# Patient Record
Sex: Female | Born: 1995 | Race: White | Hispanic: Yes | Marital: Single | Smoking: Never smoker
Health system: Southern US, Community
[De-identification: ages and names within clinical notes are randomized; demographics above are authoritative.]

## PROBLEM LIST (undated history)

## (undated) DIAGNOSIS — F32A Depression, unspecified: Secondary | ICD-10-CM

## (undated) DIAGNOSIS — F988 Other specified behavioral and emotional disorders with onset usually occurring in childhood and adolescence: Secondary | ICD-10-CM

## (undated) DIAGNOSIS — F319 Bipolar disorder, unspecified: Secondary | ICD-10-CM

## (undated) DIAGNOSIS — J45909 Unspecified asthma, uncomplicated: Secondary | ICD-10-CM

## (undated) DIAGNOSIS — F329 Major depressive disorder, single episode, unspecified: Secondary | ICD-10-CM

## (undated) DIAGNOSIS — E559 Vitamin D deficiency, unspecified: Secondary | ICD-10-CM

## (undated) DIAGNOSIS — E7800 Pure hypercholesterolemia, unspecified: Secondary | ICD-10-CM

## (undated) DIAGNOSIS — D649 Anemia, unspecified: Secondary | ICD-10-CM

## (undated) DIAGNOSIS — F419 Anxiety disorder, unspecified: Secondary | ICD-10-CM

## (undated) HISTORY — PX: NOSE SURGERY: SHX723

## (undated) HISTORY — DX: Anemia, unspecified: D64.9

## (undated) HISTORY — DX: Anxiety disorder, unspecified: F41.9

## (undated) HISTORY — DX: Major depressive disorder, single episode, unspecified: F32.9

## (undated) HISTORY — DX: Other specified behavioral and emotional disorders with onset usually occurring in childhood and adolescence: F98.8

## (undated) HISTORY — DX: Pure hypercholesterolemia, unspecified: E78.00

## (undated) HISTORY — DX: Vitamin D deficiency, unspecified: E55.9

## (undated) HISTORY — DX: Depression, unspecified: F32.A

## (undated) HISTORY — DX: Bipolar disorder, unspecified: F31.9

---
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
## 2016-01-01 DIAGNOSIS — F329 Major depressive disorder, single episode, unspecified: Secondary | ICD-10-CM | POA: Insufficient documentation

## 2016-01-01 DIAGNOSIS — F32A Depression, unspecified: Secondary | ICD-10-CM | POA: Insufficient documentation

## 2016-01-07 ENCOUNTER — Other Ambulatory Visit: Payer: Self-pay | Admitting: Student

## 2016-01-07 DIAGNOSIS — E049 Nontoxic goiter, unspecified: Secondary | ICD-10-CM

## 2016-01-13 ENCOUNTER — Ambulatory Visit
Admission: RE | Admit: 2016-01-13 | Discharge: 2016-01-13 | Disposition: A | Discharge status: Home / Self Care | Source: Ambulatory Visit | Attending: Student | Admitting: Student

## 2016-01-13 DIAGNOSIS — E049 Nontoxic goiter, unspecified: Secondary | ICD-10-CM | POA: Insufficient documentation

## 2016-09-13 DIAGNOSIS — F3132 Bipolar disorder, current episode depressed, moderate: Secondary | ICD-10-CM | POA: Insufficient documentation

## 2016-11-06 IMAGING — US US ABDOMEN LIMITED
1 series · 14 of 25 positions shown · non-contrast
Comparison: None.

CLINICAL DATA: Intermittent RIGHT upper quadrant pain, nausea and
vomiting for 1 month.

EXAM:
US ABDOMEN LIMITED - RIGHT UPPER QUADRANT

[Series 1: us abdomen limited · 0.18mm/px · 14 of 40 slices shown]
[im 1/40]
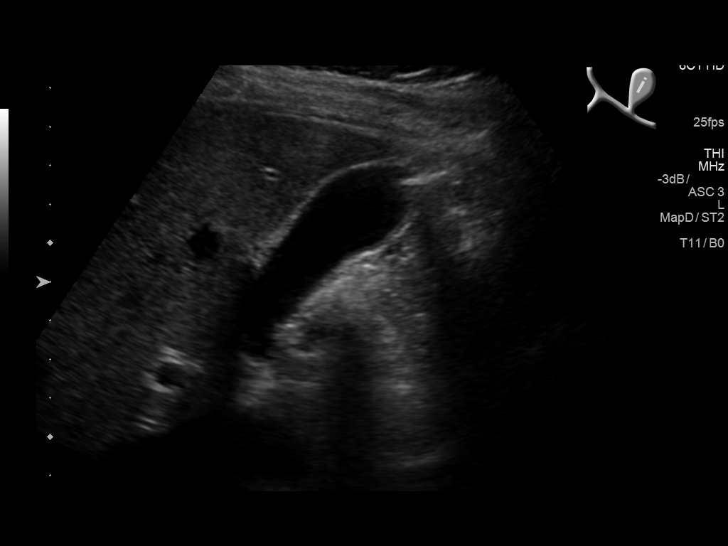
[im 4/40]
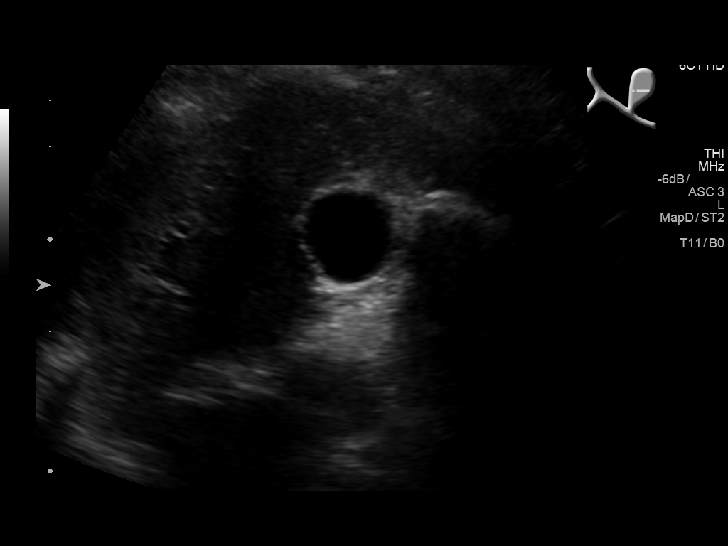
[im 7/40]
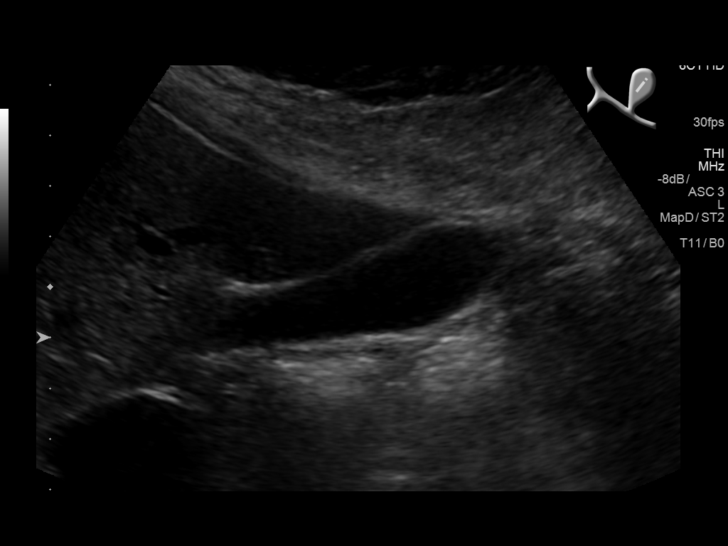
[im 10/40]
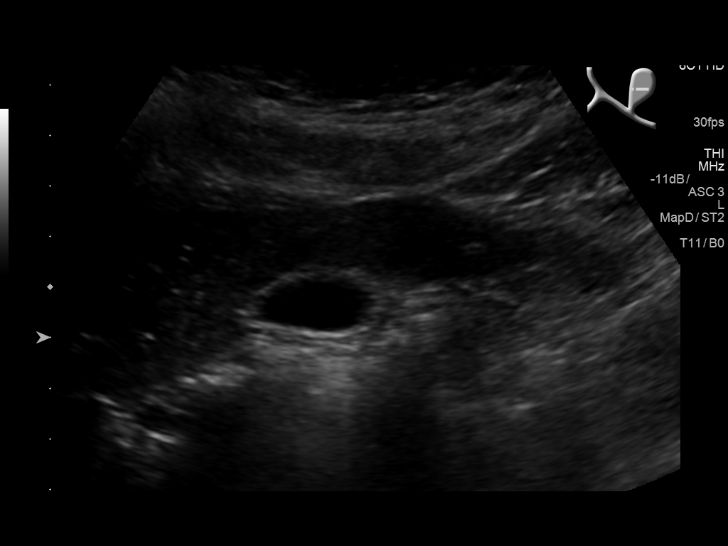
[im 14/40]
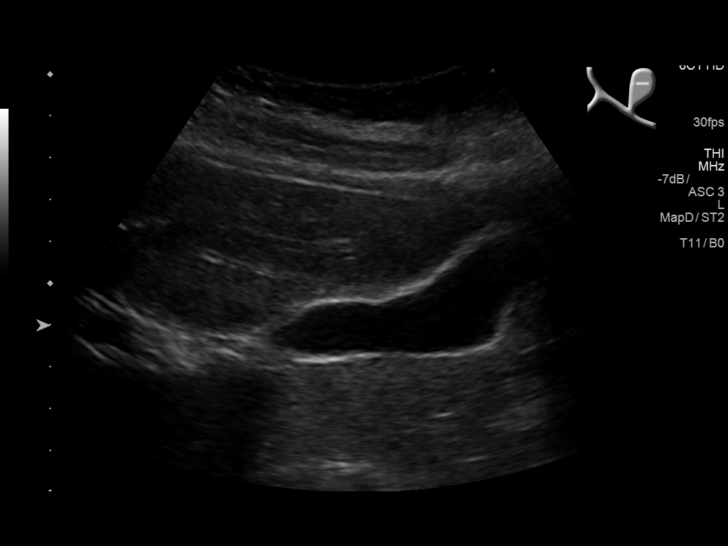
[im 15/40]
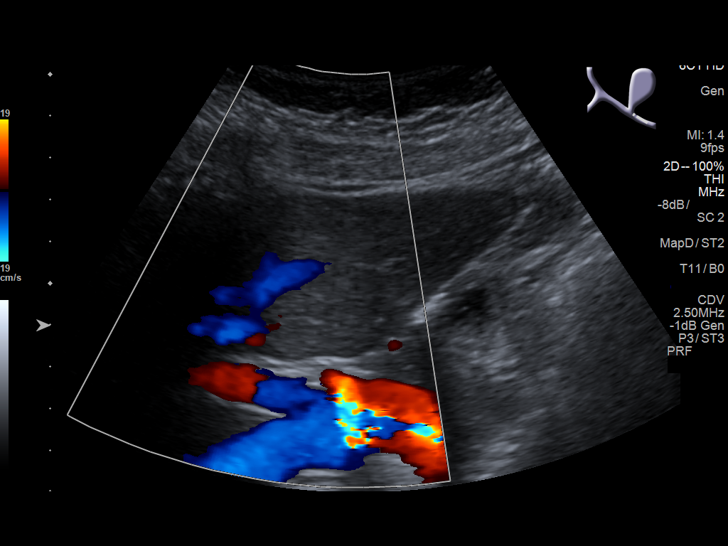
[im 18/40]
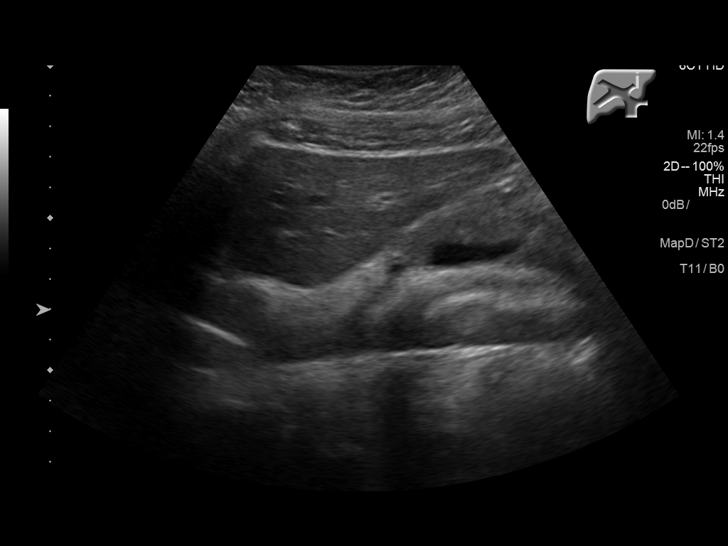
[im 22/40]
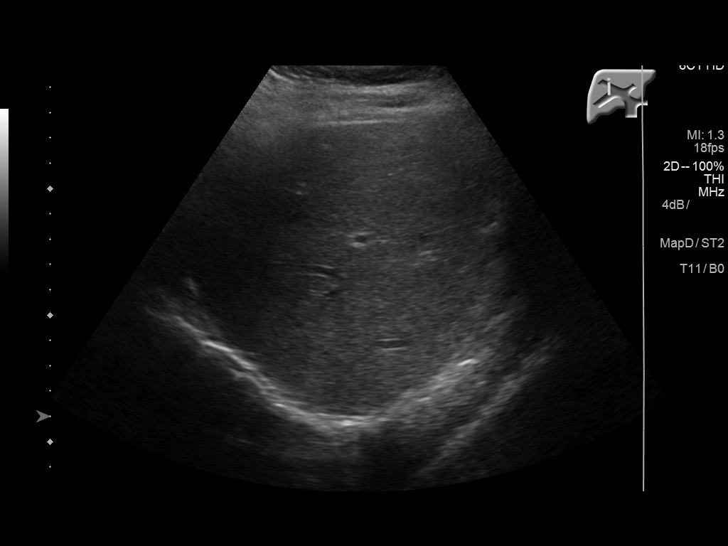
[im 25/40]
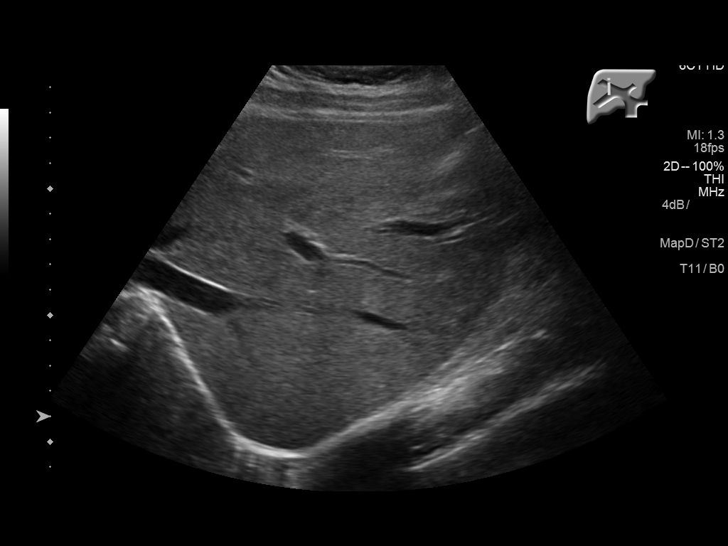
[im 27/40]
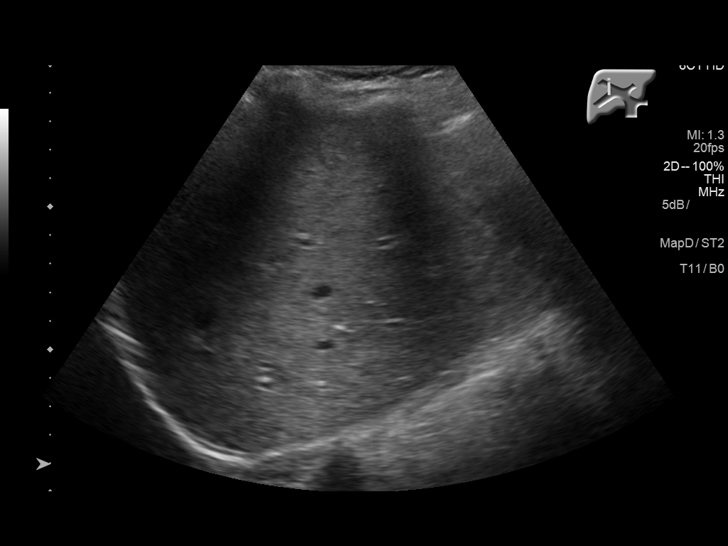
[im 30/40]
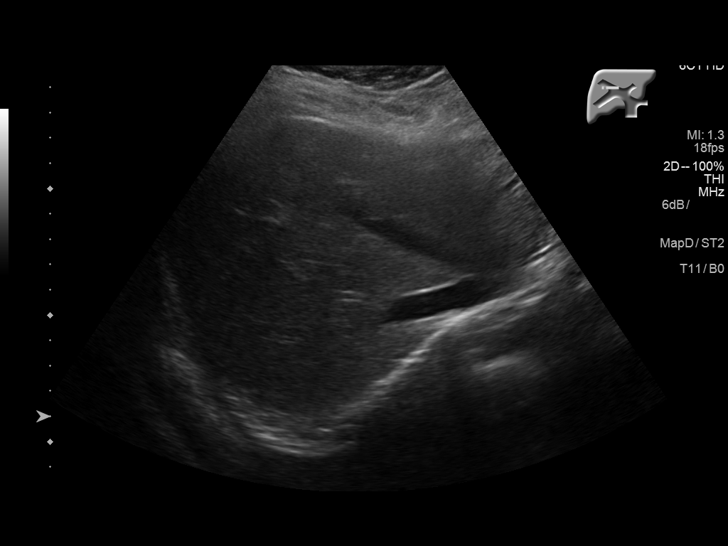
[im 33/40]
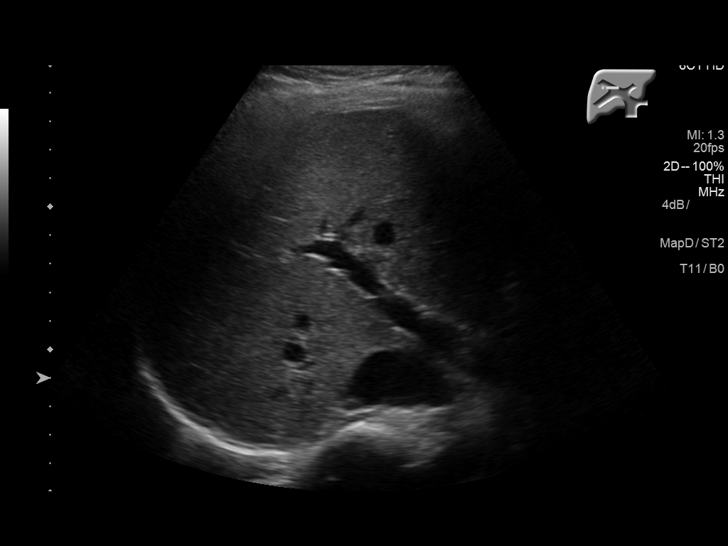
[im 36/40]
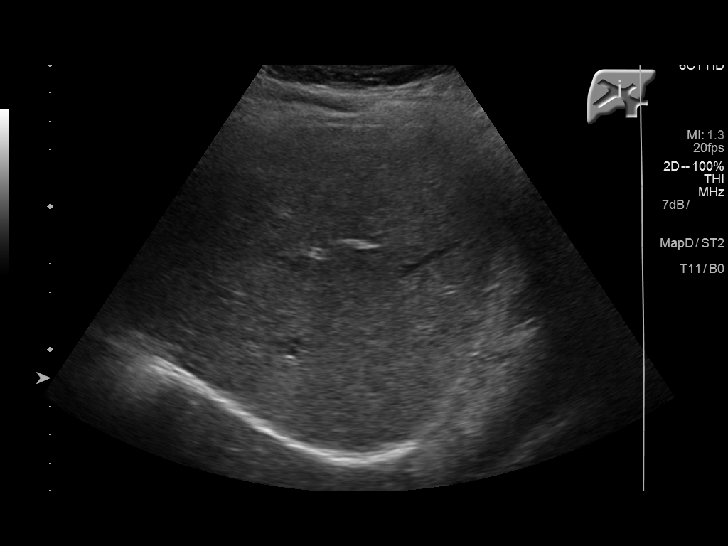
[im 40/40]
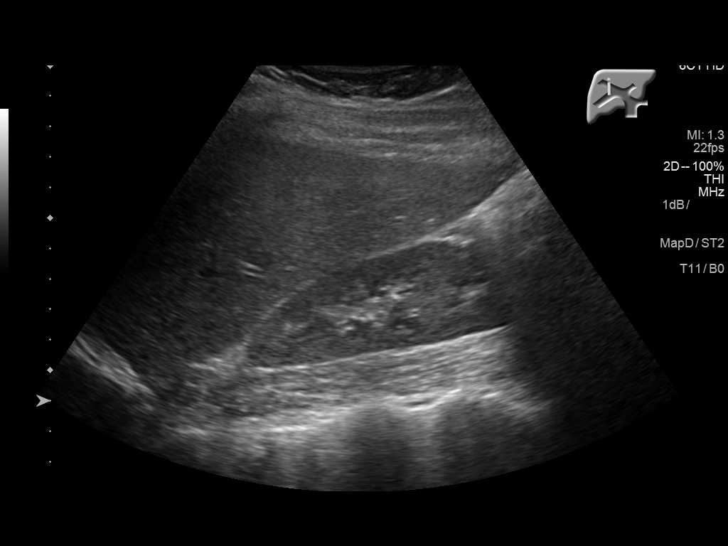

[14 of 25 positions shown; findings below may reference images not displayed]

FINDINGS: Gallbladder:

No gallstones or wall thickening visualized. No sonographic Murphy
sign noted by sonographer.

Common bile duct:

Diameter: 2 mm

Liver:

No focal lesion identified. Within normal limits in parenchymal
echogenicity. Hepatopetal portal vein.
IMPRESSION: Negative RIGHT upper quadrant ultrasound.

## 2017-02-10 IMAGING — US US SOFT TISSUE HEAD/NECK
1 series · 14 of 25 positions shown · non-contrast
Comparison: None.

CLINICAL DATA: Goiter.  Palpable thyroid enlargement.

EXAM:
THYROID ULTRASOUND
TECHNIQUE: Ultrasound examination of the thyroid gland and adjacent soft
tissues was performed.

[Series 1: us soft tissue head/neck · 0.07mm/px · 14 of 44 slices shown]
[im 1/44]
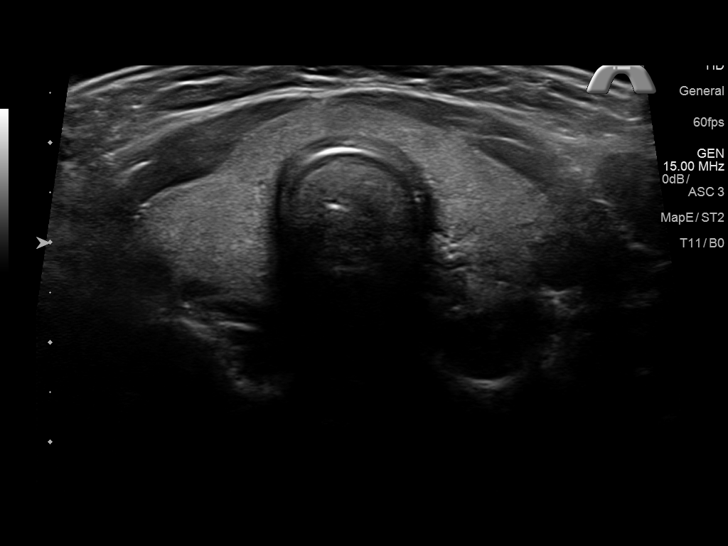
[im 4/44]
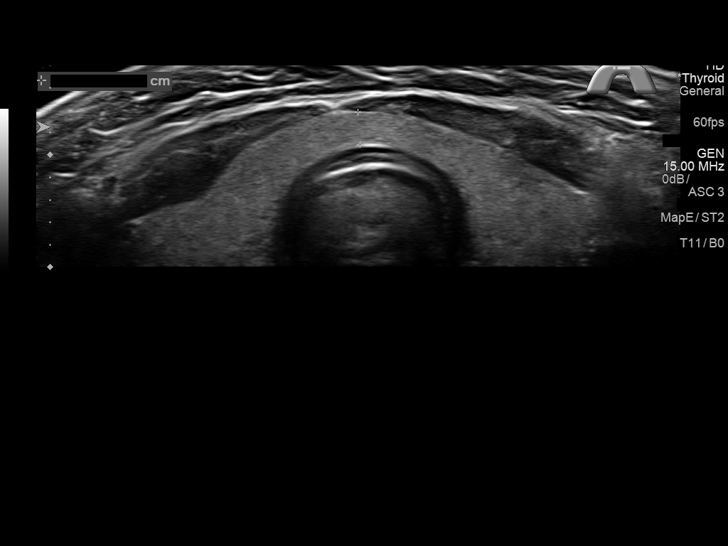
[im 8/44]
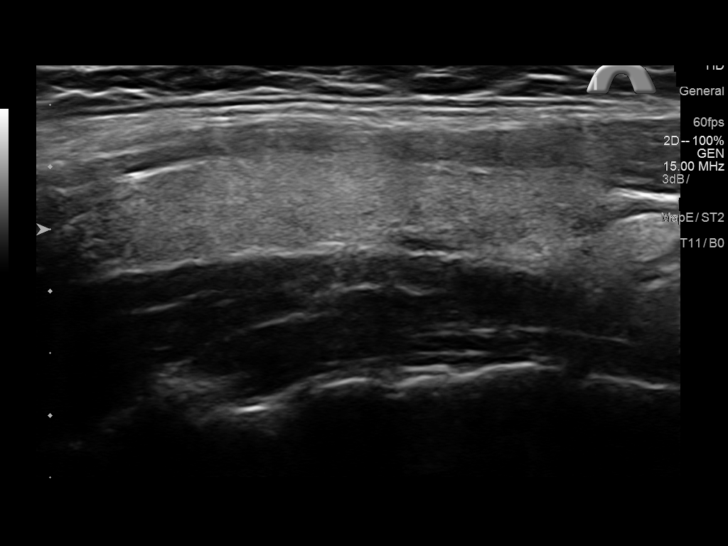
[im 11/44]
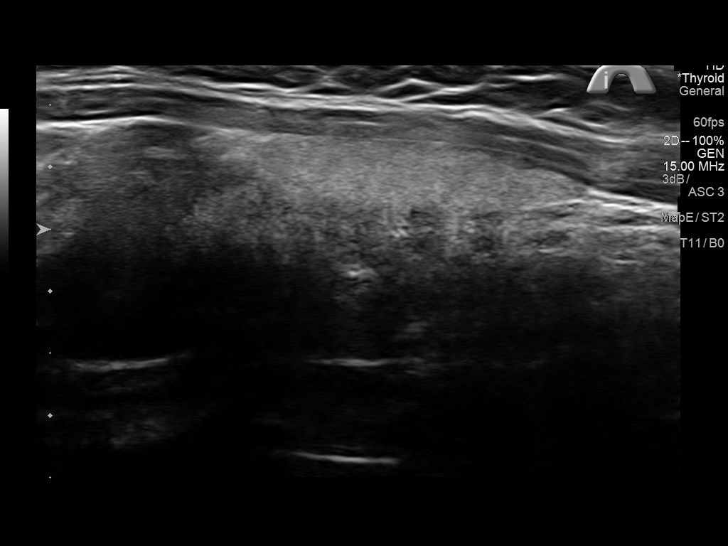
[im 15/44]
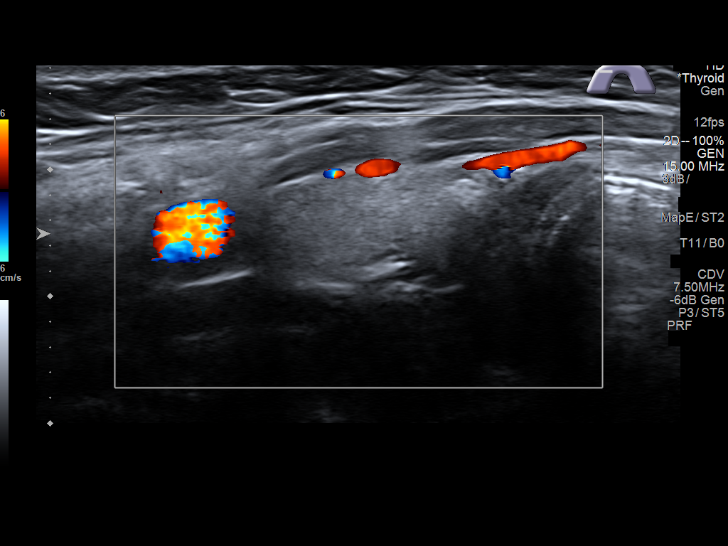
[im 17/44]
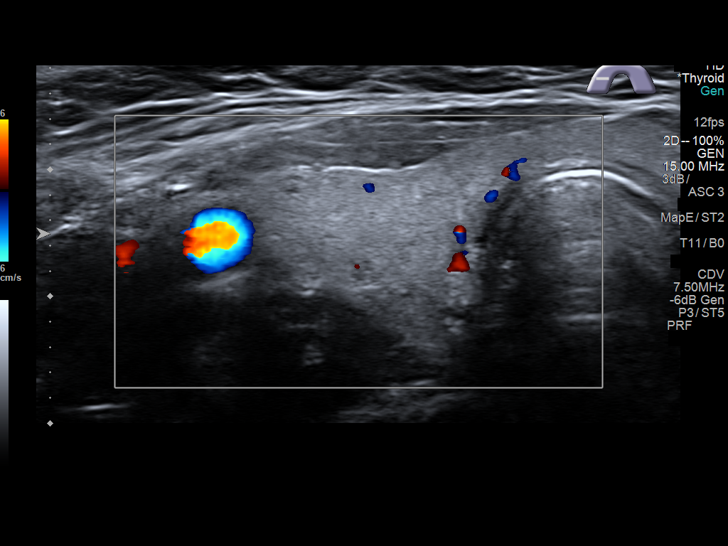
[im 20/44]
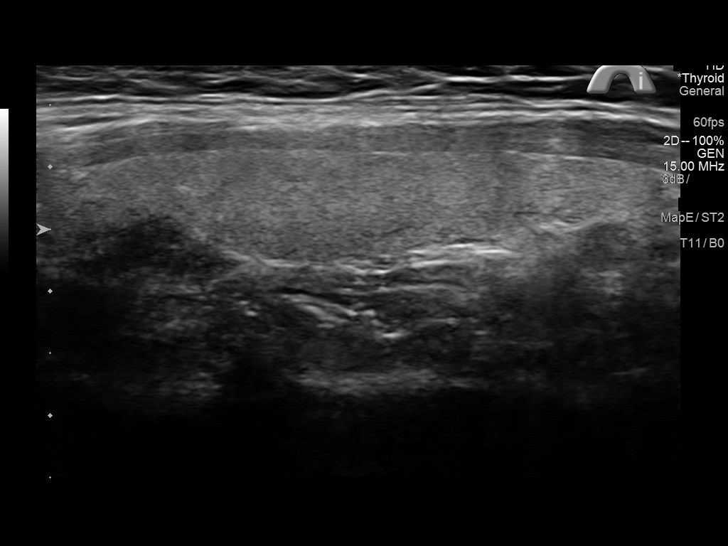
[im 24/44]
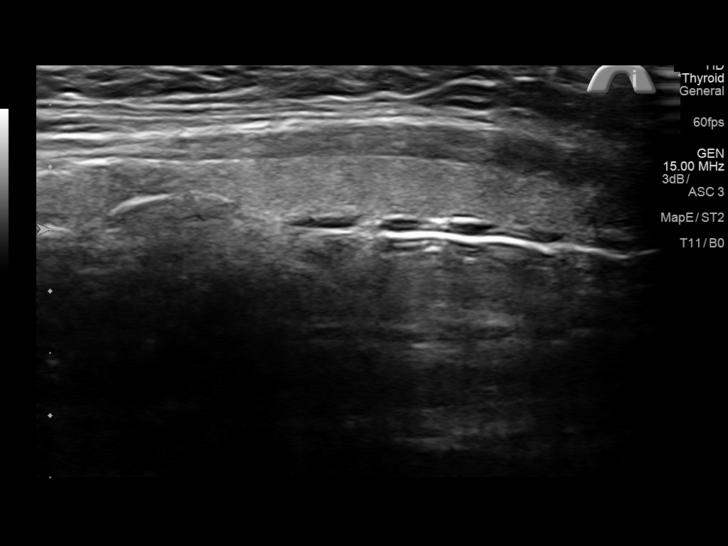
[im 27/44]
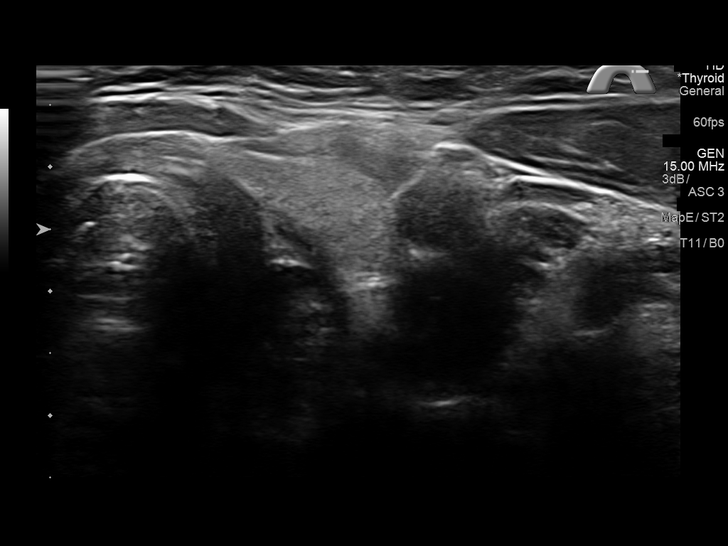
[im 29/44]
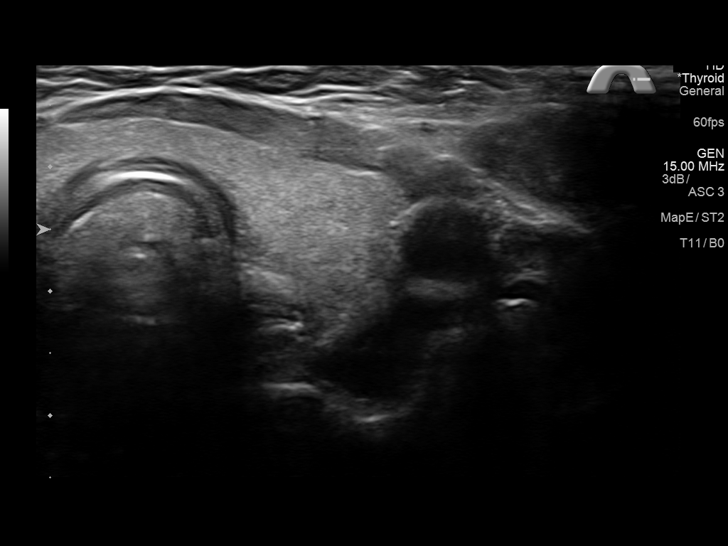
[im 33/44]
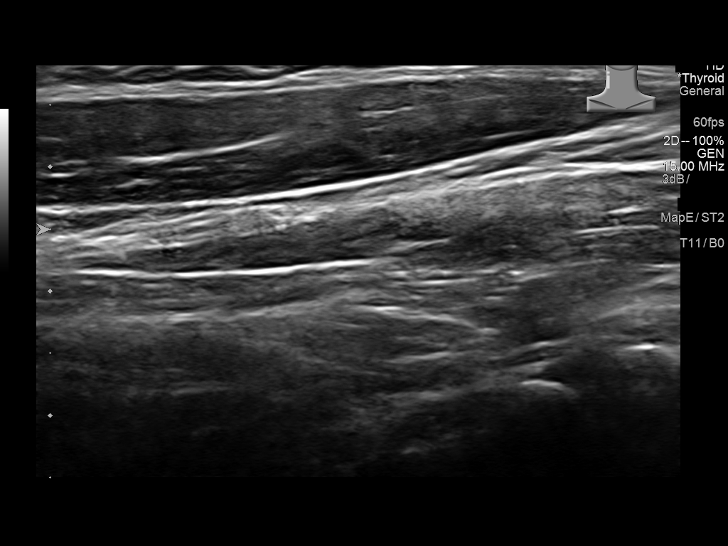
[im 36/44]
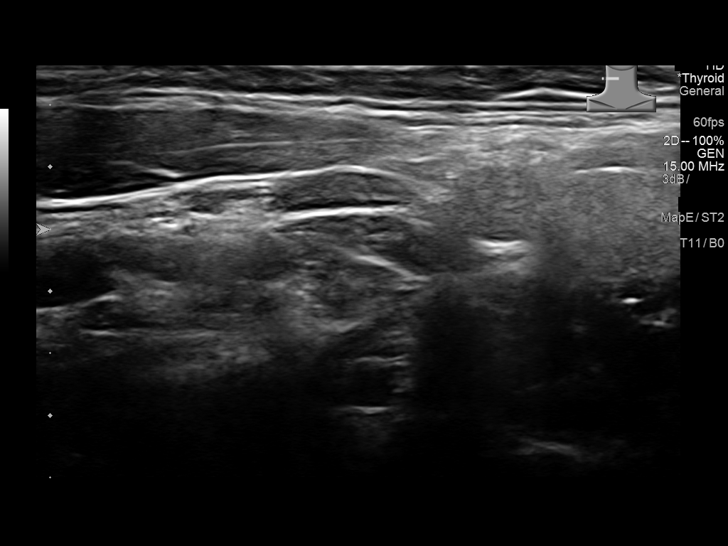
[im 40/44]
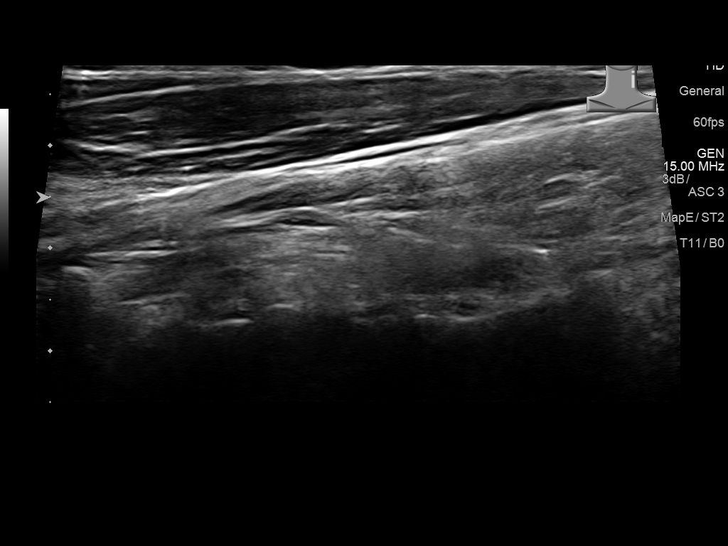
[im 44/44]
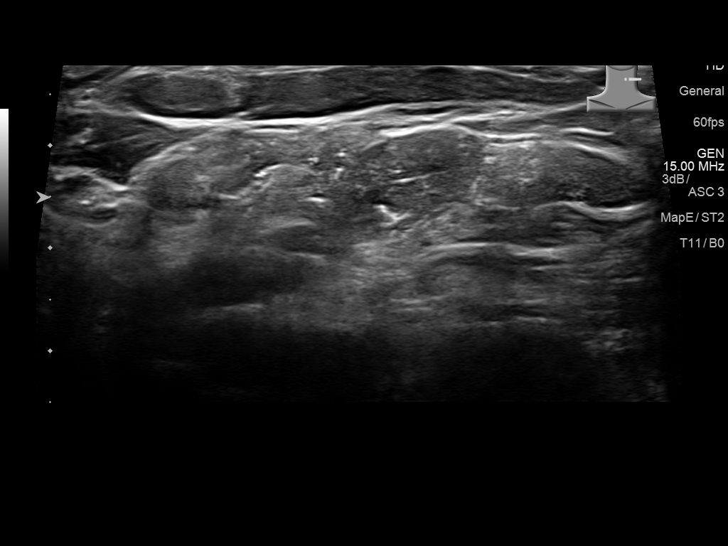

[14 of 25 positions shown; findings below may reference images not displayed]

FINDINGS: Parenchymal Echotexture: Normal

Estimated total number of nodules >/= 1 cm: 0

Number of spongiform nodules >/=  2 cm not described below (TR1): 0

Number of mixed cystic and solid nodules >/= 1.5 cm not described
below (TR2): 0

_________________________________________________________

Isthmus: 0.3 cm

No discrete nodules are identified within the thyroid isthmus.

_________________________________________________________

Right lobe: 4.9 x 1.0 x 1.6 cm

No discrete nodules are identified within the right lobe of the
thyroid.

_________________________________________________________

Left lobe: 4.7 x 0.8 x 1.3 cm

No discrete nodules are identified within the left lobe of the
thyroid.

No enlarged lymph nodes identified.
IMPRESSION: Normal thyroid ultrasound. Thyroid gland volume appears normal by
ultrasound.

## 2018-02-20 ENCOUNTER — Encounter: Payer: Self-pay | Admitting: Psychiatry

## 2018-02-20 ENCOUNTER — Ambulatory Visit (INDEPENDENT_AMBULATORY_CARE_PROVIDER_SITE_OTHER): Admitting: Psychiatry

## 2018-02-20 ENCOUNTER — Other Ambulatory Visit: Payer: Self-pay

## 2018-02-20 VITALS — BP 118/81 | HR 91 | Temp 99.0°F | Wt 169.6 lb

## 2018-02-20 DIAGNOSIS — F5105 Insomnia due to other mental disorder: Secondary | ICD-10-CM | POA: Diagnosis not present

## 2018-02-20 DIAGNOSIS — F411 Generalized anxiety disorder: Secondary | ICD-10-CM | POA: Diagnosis not present

## 2018-02-20 DIAGNOSIS — F329 Major depressive disorder, single episode, unspecified: Secondary | ICD-10-CM

## 2018-02-20 DIAGNOSIS — F32A Depression, unspecified: Secondary | ICD-10-CM

## 2018-02-20 MED ORDER — HYDROXYZINE HCL 25 MG PO TABS: 12.5000 mg | ORAL_TABLET | Freq: Three times a day (TID) | ORAL | 3 refills | Status: DC | PRN

## 2018-02-20 NOTE — Patient Instructions (Signed)
Hydroxyzine capsules or tablets What is this medicine? HYDROXYZINE (hye DROX i zeen) is an antihistamine. This medicine is used to treat allergy symptoms. It is also used to treat anxiety and tension. This medicine can be used with other medicines to induce sleep before surgery. This medicine may be used for other purposes; ask your health care provider or pharmacist if you have questions. COMMON BRAND NAME(S): ANX, Atarax, Rezine, Vistaril What should I tell my health care provider before I take this medicine? They need to know if you have any of these conditions: -glaucoma -heart disease -history of irregular heartbeat -kidney disease -liver disease -lung or breathing disease, like asthma -stomach or intestine problems -thyroid disease -trouble passing urine -an unusual or allergic reaction to hydroxyzine, cetirizine, other medicines, foods, dyes or preservatives -pregnant or trying to get pregnant -breast-feeding How should I use this medicine? Take this medicine by mouth with a full glass of water. Follow the directions on the prescription label. You may take this medicine with food or on an empty stomach. Take your medicine at regular intervals. Do not take your medicine more often than directed. Talk to your pediatrician regarding the use of this medicine in children. Special care may be needed. While this drug may be prescribed for children as [place] as 6 years of age for selected conditions, precautions do apply. Patients over 65 years old may have a stronger reaction and need a smaller dose. Overdosage: If you think you have taken too much of this medicine contact a poison control center or emergency room at once. NOTE: This medicine is only for you. Do not share this medicine with others. What if I miss a dose? If you miss a dose, take it as soon as you can. If it is almost time for your next dose, take only that dose. Do not take double or extra doses. What may interact with this  medicine? Do not take this medicine with any of the following medications: -cisapride -dofetilide -dronedarone -pimozide -thioridazine This medicine may also interact with the following medications: -alcohol -antihistamines for allergy, cough, and cold -atropine -barbiturate medicines for sleep or seizures, like phenobarbital -certain antibiotics like erythromycin or clarithromycin -certain medicines for anxiety or sleep -certain medicines for bladder problems like oxybutynin, tolterodine -certain medicines for depression or psychotic disturbances -certain medicines for irregular heart beat -certain medicines for Parkinson's disease like benztropine, trihexyphenidyl -certain medicines for seizures like phenobarbital, primidone -certain medicines for stomach problems like dicyclomine, hyoscyamine -certain medicines for travel sickness like scopolamine -ipratropium -narcotic medicines for pain -other medicines that prolong the QT interval (an abnormal heart rhythm) This list may not describe all possible interactions. Give your health care provider a list of all the medicines, herbs, non-prescription drugs, or dietary supplements you use. Also tell them if you smoke, drink alcohol, or use illegal drugs. Some items may interact with your medicine. What should I watch for while using this medicine? Tell your doctor or health care professional if your symptoms do not improve. You may get drowsy or dizzy. Do not drive, use machinery, or do anything that needs mental alertness until you know how this medicine affects you. Do not stand or sit up quickly, especially if you are an older patient. This reduces the risk of dizzy or fainting spells. Alcohol may interfere with the effect of this medicine. Avoid alcoholic drinks. Your mouth may get dry. Chewing sugarless gum or sucking hard candy, and drinking plenty of water may help. Contact your doctor if   the problem does not go away or is  severe. This medicine may cause dry eyes and blurred vision. If you wear contact lenses you may feel some discomfort. Lubricating drops may help. See your eye doctor if the problem does not go away or is severe. If you are receiving skin tests for allergies, tell your doctor you are using this medicine. What side effects may I notice from receiving this medicine? Side effects that you should report to your doctor or health care professional as soon as possible: -allergic reactions like skin rash, itching or hives, swelling of the face, lips, or tongue -changes in vision -confusion -fast, irregular heartbeat -seizures -tremor -trouble passing urine or change in the amount of urine Side effects that usually do not require medical attention (report to your doctor or health care professional if they continue or are bothersome): -constipation -drowsiness -dry mouth -headache -tiredness This list may not describe all possible side effects. Call your doctor for medical advice about side effects. You may report side effects to FDA at 1-800-FDA-1088. Where should I keep my medicine? Keep out of the reach of children. Store at room temperature between 15 and 30 degrees C (59 and 86 degrees F). Keep container tightly closed. Throw away any unused medicine after the expiration date. NOTE: This sheet is a summary. It may not cover all possible information. If you have questions about this medicine, talk to your doctor, pharmacist, or health care provider.  2019 Elsevier/Gold Standard (2017-07-17 13:25:13)  
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
## 2019-04-10 ENCOUNTER — Encounter: Payer: Self-pay | Admitting: Family Medicine

## 2019-04-10 ENCOUNTER — Other Ambulatory Visit: Payer: Self-pay

## 2019-04-10 ENCOUNTER — Ambulatory Visit (INDEPENDENT_AMBULATORY_CARE_PROVIDER_SITE_OTHER): Admitting: Family Medicine

## 2019-04-10 VITALS — BP 118/82 | HR 85 | Temp 98.0°F | Ht 64.0 in | Wt 165.2 lb

## 2019-04-10 DIAGNOSIS — R1084 Generalized abdominal pain: Secondary | ICD-10-CM

## 2019-04-10 DIAGNOSIS — G8929 Other chronic pain: Secondary | ICD-10-CM

## 2019-04-10 DIAGNOSIS — F329 Major depressive disorder, single episode, unspecified: Secondary | ICD-10-CM | POA: Diagnosis not present

## 2019-04-10 DIAGNOSIS — F5104 Psychophysiologic insomnia: Secondary | ICD-10-CM

## 2019-04-10 DIAGNOSIS — Z975 Presence of (intrauterine) contraceptive device: Secondary | ICD-10-CM

## 2019-04-10 DIAGNOSIS — F32A Depression, unspecified: Secondary | ICD-10-CM

## 2019-04-10 DIAGNOSIS — F411 Generalized anxiety disorder: Secondary | ICD-10-CM | POA: Diagnosis not present

## 2019-04-10 HISTORY — DX: Psychophysiologic insomnia: F51.04

## 2019-04-10 HISTORY — DX: Generalized anxiety disorder: F41.1

## 2019-04-10 HISTORY — DX: Presence of (intrauterine) contraceptive device: Z97.5

## 2019-04-10 LAB — CBC WITH DIFFERENTIAL/PLATELET
Basophils Absolute: 0 10*3/uL (ref 0.0–0.1)
Basophils Relative: 0.6 % (ref 0.0–3.0)
Eosinophils Absolute: 0.1 10*3/uL (ref 0.0–0.7)
Eosinophils Relative: 1.4 % (ref 0.0–5.0)
HCT: 40 % (ref 36.0–46.0)
Hemoglobin: 13.1 g/dL (ref 12.0–15.0)
Lymphocytes Relative: 33.4 % (ref 12.0–46.0)
Lymphs Abs: 2.5 10*3/uL (ref 0.7–4.0)
MCHC: 32.7 g/dL (ref 30.0–36.0)
MCV: 89.8 fl (ref 78.0–100.0)
Monocytes Absolute: 0.4 10*3/uL (ref 0.1–1.0)
Monocytes Relative: 5.9 % (ref 3.0–12.0)
Neutro Abs: 4.4 10*3/uL (ref 1.4–7.7)
Neutrophils Relative %: 58.7 % (ref 43.0–77.0)
Platelets: 283 10*3/uL (ref 150.0–400.0)
RBC: 4.45 Mil/uL (ref 3.87–5.11)
RDW: 13.2 % (ref 11.5–15.5)
WBC: 7.5 10*3/uL (ref 4.0–10.5)

## 2019-04-10 LAB — H. PYLORI ANTIBODY, IGG: H Pylori IgG: NEGATIVE

## 2019-04-10 LAB — COMPREHENSIVE METABOLIC PANEL
ALT: 12 U/L (ref 0–35)
AST: 11 U/L (ref 0–37)
Albumin: 4.8 g/dL (ref 3.5–5.2)
Alkaline Phosphatase: 65 U/L (ref 39–117)
BUN: 9 mg/dL (ref 6–23)
CO2: 27 mEq/L (ref 19–32)
Calcium: 9.9 mg/dL (ref 8.4–10.5)
Chloride: 106 mEq/L (ref 96–112)
Creatinine, Ser: 0.72 mg/dL (ref 0.40–1.20)
GFR: 100.44 mL/min (ref >60.00)
Glucose, Bld: 89 mg/dL (ref 70–99)
Potassium: 4.3 mEq/L (ref 3.5–5.1)
Sodium: 140 mEq/L (ref 135–145)
Total Bilirubin: 0.5 mg/dL (ref 0.2–1.2)
Total Protein: 7.9 g/dL (ref 6.0–8.3)

## 2019-04-10 LAB — TSH: TSH: 1.48 u[IU]/mL (ref 0.35–4.50)

## 2019-04-10 LAB — LIPASE: Lipase: 14 U/L (ref 11.0–59.0)

## 2019-04-10 MED ORDER — OMEPRAZOLE 20 MG PO CPDR: 20.0000 mg | DELAYED_RELEASE_CAPSULE | Freq: Every day | ORAL | 3 refills | Status: DC

## 2019-04-10 MED ORDER — DICYCLOMINE HCL 10 MG PO CAPS: 10.0000 mg | ORAL_CAPSULE | Freq: Three times a day (TID) | ORAL | 1 refills | Status: DC

## 2019-04-10 NOTE — Patient Instructions (Signed)
Please return in 6 weeks for recheck abdominal pain.  Start the medications as directed. I will release your lab results to you on your MyChart account with further instructions. Please reply with any questions.   We will refer you back to your psychiatrist   It was a pleasure meeting you today! Thank you for choosing [place] to meet your healthcare needs! I truly look forward to working with you. If you have any questions or concerns, please send me a message via Mychart or call the office at [phone].   Abdominal Pain, Adult Pain in the abdomen (abdominal pain) can be caused by many things. Often, abdominal pain is not serious and it gets better with no treatment or by being treated at home. However, sometimes abdominal pain is serious. Your health care provider will ask questions about your medical history and do a physical exam to try to determine the cause of your abdominal pain. Follow these instructions at home:  Medicines  Take over-the-counter and prescription medicines only as told by your health care provider.  Do not take a laxative unless told by your health care provider. General instructions  Watch your condition for any changes.  Drink enough fluid to keep your urine pale yellow.  Keep all follow-up visits as told by your health care provider. This is important. Contact a health care provider if:  Your abdominal pain changes or gets worse.  You are not hungry or you lose weight without trying.  You are constipated or have diarrhea for more than 2-3 days.  You have pain when you urinate or have a bowel movement.  Your abdominal pain wakes you up at night.  Your pain gets worse with meals, after eating, or with certain foods.  You are vomiting and cannot keep anything down.  You have a fever.  You have blood in your urine. Get help right away if:  Your pain does not go away as soon as your health care provider told you to expect.  You cannot stop  vomiting.  Your pain is only in areas of the abdomen, such as the right side or the left lower portion of the abdomen. Pain on the right side could be caused by appendicitis.  You have bloody or black stools, or stools that look like tar.  You have severe pain, cramping, or bloating in your abdomen.  You have signs of dehydration, such as: ? Dark urine, very little urine, or no urine. ? Cracked lips. ? Dry mouth. ? Sunken eyes. ? Sleepiness. ? Weakness.  You have trouble breathing or chest pain. Summary  Often, abdominal pain is not serious and it gets better with no treatment or by being treated at home. However, sometimes abdominal pain is serious.  Watch your condition for any changes.  Take over-the-counter and prescription medicines only as told by your health care provider.  Contact a health care provider if your abdominal pain changes or gets worse.  Get help right away if you have severe pain, cramping, or bloating in your abdomen. This information is not intended to replace advice given to you by your health care provider. Make sure you discuss any questions you have with your health care provider. Document Revised: 05/14/2018 Document Reviewed: 05/14/2018 Elsevier Patient Education  [place].

## 2019-04-10 NOTE — Progress Notes (Signed)
Subjective  CC:  Chief Complaint  Patient presents with  . New Patient (Initial Visit)    Dr. Venetia Maxon form [facility] in [place] last pcp. requesting new gyn  . Abdominal Pain    stomach pain for a long time. progrossively getting worse  . Depression    takes Wellbutrin. sees a therapist    HPI: Haley Salazar is a 24 y.o. female who presents to [facility] [place] today to establish care with me as a new patient.  I have reviewed extensive notes in chart; reviewed psychiatric evaluation.  She has the following concerns or needs:  24 yo graduate from [place] presents to establish with new pcp. Lives in [place] now and works at [employer]; her boss is my patient and referred her here. She likes her job. Single, G0, Nexplanon in place and has a cat.   Mood disorder: has struggled for years; evaluated by psychiatry last year and dxd with GAD and likely depressive d/o although she was also concerned about possible personality d/o due to cluster B sxs. Currently, pt is doing ok although she reports she is still depressed. She is on wellbutrin which helps her anxiety. She has failed lexapro and prozac in past due to side effects (felt like a zombie). She has intermittent SI and has had a suicidal gesture in the past with pills she would like to see the psychiatrist again but needs a referral. She continues with regular therapy sessions .  Generalized abdominal pain since she was a child. She reports worsening sxs: intermittent cramping abdominal pain w/o clear pattern. She uses tums frequently. Has intermittent nausea and will vomit - after meals but also associated with anxiety. She reports she moves her bowels, soft and firm, after each meal. Denies problems with constipation or diarrhea. No fam h/ of inflammatory bowel disease. No clear trigger foods although she often vomits after mac and cheese. No blood or mucous in the stool. She admits to occ  heart burn sxs. She says she hurts most of the time. Never has had GI eval.   Due for cpe. Due for tdap but getting covid vaccinations this weekend so need to defer.  Assessment  1. Chronic generalized abdominal pain   2. GAD (generalized anxiety disorder)   3. Depressive disorder   4. Presence of subdermal contraceptive implant   5. Insomnia, psychophysiological      Plan   Abdominal pain: ? Functional vs IBS vs other. Check labs and gluten antibodies. Treat for GERD and IBS with PPI and antispamodic and recheck in 4-6 weeks. No red flag sxs present  Mood disorder: improved but still active. Refer back to Psychiatry.  Contraception: nexplanon in place. Pap up to date by report  Insomnia managed with otc melatonin.   HM: due cpe at her convenience. Defer tdap until then.   Follow up:  4-6 weeks for recheck abd pain Orders Placed This Encounter  Procedures  . CBC with Differential/Platelet  . Comprehensive metabolic panel  . TSH  . Lipase  . H. pylori antibody, IgG  . Glia (IgA/G) + tTG IgA  . Ambulatory referral to Psychiatry   Meds ordered this encounter  Medications  . omeprazole (PRILOSEC) 20 MG capsule    Sig: Take 1 capsule (20 mg total) by mouth daily.    Dispense:  30 capsule    Refill:  3  . dicyclomine (BENTYL) 10 MG capsule    Sig: Take 1  capsule (10 mg total) by mouth 4 (four) times daily -  before meals and at bedtime. As needed for abdominal cramps    Dispense:  60 capsule    Refill:  1     Depression screen [facility] 2/9 04/10/2019  Decreased Interest 1  Down, Depressed, Hopeless 2  PHQ - 2 Score 3  Altered sleeping 3  Tired, decreased energy 3  Change in appetite 1  Feeling bad or failure about yourself  1  Trouble concentrating 2  Moving slowly or fidgety/restless 0  Suicidal thoughts 1  PHQ-9 Score 14  Difficult doing work/chores Somewhat difficult    We updated and reviewed the patient's past history in detail and it is documented below.    Patient Active Problem List   Diagnosis Date Noted  . GAD (generalized anxiety disorder) 04/10/2019  . Presence of subdermal contraceptive implant 04/10/2019    Placed 02/08/2018; due out 02/08/2021 [facility] clinic   . Insomnia, psychophysiological 04/10/2019  . Depressive disorder 01/01/2016    Psychiatric evaluation 2020; depressive d/o; r/o cluster B traits    Health Maintenance  Topic Date Due  . TETANUS/TDAP  08/07/2019 (Originally 05/04/2015)  . PAP-Cervical Cytology Screening  11/05/2020  . PAP SMEAR-Modifier  11/05/2020  . INFLUENZA VACCINE  Completed  . HIV Screening  Completed   Immunization History  Administered Date(s) Administered  . Influenza Inj Mdck Quad Pf 11/08/2017, 12/20/2018   Current Meds  Medication Sig  . buPROPion (WELLBUTRIN XL) 150 MG 24 hr tablet Take 150 mg by mouth daily.  Marland Kitchen etonogestrel (NEXPLANON) 68 MG IMPL implant Inject 1 each into the skin once.  . hydrOXYzine (ATARAX/VISTARIL) 25 MG tablet Take 0.5-1 tablets (12.5-25 mg total) by mouth 3 (three) times daily as needed for anxiety.  . Melatonin 10 MG TABS Take 10 mg by mouth as needed.    Allergies: Patient has No Known Allergies. Past Medical History Patient  has a past medical history of Anxiety, Asthma, Depression, GAD (generalized anxiety disorder) (04/10/2019), Insomnia, psychophysiological (04/10/2019), and Presence of subdermal contraceptive implant (04/10/2019). Past Surgical History Patient  has a past surgical history that includes Nose surgery. Family History: Patient family history includes Alcohol abuse in her paternal aunt; Depression in her mother. Social History:  Patient  reports that she has never smoked. She has never used smokeless tobacco. She reports that she does not drink alcohol or use drugs.  Review of Systems: Constitutional: negative for fever or malaise Ophthalmic: negative for photophobia, double vision or loss of vision Cardiovascular: negative for chest pain,  dyspnea on exertion, or new LE swelling Respiratory: negative for SOB or persistent cough Gastrointestinal: +for abdominal pain without change in bowel habits or melena Genitourinary: negative for dysuria or gross hematuria Musculoskeletal: negative for new gait disturbance or muscular weakness Integumentary: negative for new or persistent rashes Neurological: negative for TIA or stroke symptoms Psychiatric: + for SI no delusions Allergic/Immunologic: negative for hives  Patient Care Team    Relationship Specialty Notifications Start End  Leamon Arnt, MD PCP - General Family Medicine  04/10/19     Objective  Vitals: BP 118/82 (BP Location: Right Arm, Patient Position: Sitting, Cuff Size: Normal)   Pulse 85   Temp 98 F (36.7 C) (Temporal)   Ht _0  (1.626 m)   Wt 165 lb 3.2 oz (74.9 kg)   SpO2 98%   BMI 28.36 kg/m  General:  Well developed, well nourished, no acute distress  Psych:  Alert and oriented,normal mood  and affect HEENT:  Normocephalic, atraumatic, non-icteric sclera, supple neck without adenopathy, mass or thyromegaly Cardiovascular:  RRR without gallop, rub or murmur Respiratory:  Good breath sounds bilaterally, CTAB with normal respiratory effort Gastrointestinal: normal bowel sounds, soft, mild mid epigastric ttp w/o rebound or guarding, no noted masses. No HSM    Commons side effects, risks, benefits, and alternatives for medications and treatment plan prescribed today were discussed, and the patient expressed understanding of the given instructions. Patient is instructed to call or message via MyChart if he/she has any questions or concerns regarding our treatment plan. No barriers to understanding were identified. We discussed Red Flag symptoms and signs in detail. Patient expressed understanding regarding what to do in case of urgent or emergency type symptoms.   Medication list was reconciled, printed and provided to the patient in AVS. Patient instructions  and summary information was reviewed with the patient as documented in the AVS. This note was prepared with assistance of Dragon voice recognition software. Occasional wrong-word or sound-a-like substitutions may have occurred due to the inherent limitations of voice recognition software  This visit occurred during the SARS-CoV-2 public health emergency.  Safety protocols were in place, including screening questions prior to the visit, additional usage of staff PPE, and extensive cleaning of exam room while observing appropriate contact time as indicated for disinfecting solutions.

## 2019-04-11 ENCOUNTER — Ambulatory Visit: Attending: Internal Medicine

## 2019-04-11 DIAGNOSIS — Z23 Encounter for immunization: Secondary | ICD-10-CM

## 2019-04-11 NOTE — Progress Notes (Signed)
   Covid-19 Vaccination Clinic  Name:  Marca Lenser    MRN: [id number] DOB: 1995-05-27  04/11/2019  Ms. Maynes was observed post Covid-19 immunization for 15 minutes without incident. She was provided with Vaccine Information Sheet and instruction to access the V-Safe system.   Ms. Ciano was instructed to call 911 with any severe reactions post vaccine: Marland Kitchen Difficulty breathing  . Swelling of face and throat  . A fast heartbeat  . A bad rash all over body  . Dizziness and weakness   Immunizations Administered    Name Date Dose VIS Date Route   Pfizer COVID-19 Vaccine 04/11/2019  8:48 AM 0.3 mL 12/28/2018 Intramuscular   Manufacturer: [place]   Lot: [id number]   [place]: [id number]

## 2019-04-12 ENCOUNTER — Encounter: Payer: Self-pay | Admitting: Family Medicine

## 2019-04-12 LAB — GLIA (IGA/G) + TTG IGA
Antigliadin Abs, IgA: 6 units (ref 0–19)
Gliadin IgG: 2 units (ref 0–19)
Transglutaminase IgA: <2 U/mL (ref 0–3)

## 2019-04-16 ENCOUNTER — Encounter: Payer: Self-pay | Admitting: Family Medicine

## 2019-04-16 DIAGNOSIS — F3132 Bipolar disorder, current episode depressed, moderate: Secondary | ICD-10-CM | POA: Insufficient documentation

## 2019-05-02 ENCOUNTER — Other Ambulatory Visit: Payer: Self-pay

## 2019-05-02 ENCOUNTER — Encounter: Payer: Self-pay | Admitting: Psychiatry

## 2019-05-02 ENCOUNTER — Ambulatory Visit (INDEPENDENT_AMBULATORY_CARE_PROVIDER_SITE_OTHER): Admitting: Psychiatry

## 2019-05-02 DIAGNOSIS — F331 Major depressive disorder, recurrent, moderate: Secondary | ICD-10-CM | POA: Diagnosis not present

## 2019-05-02 DIAGNOSIS — F5105 Insomnia due to other mental disorder: Secondary | ICD-10-CM | POA: Diagnosis not present

## 2019-05-02 DIAGNOSIS — F411 Generalized anxiety disorder: Secondary | ICD-10-CM

## 2019-05-02 MED ORDER — BUPROPION HCL ER (XL) 300 MG PO TB24: 300.0000 mg | ORAL_TABLET | Freq: Every day | ORAL | 1 refills | Status: DC

## 2019-05-02 NOTE — Progress Notes (Signed)
Provider Location : [facility] Patient Location : Work  Chief of Staff via Video Note  I connected with Haley Salazar on 05/02/19 at  2:20 PM EDT by a video enabled telemedicine application and verified that I am speaking with the correct person using two identifiers.   I discussed the limitations of evaluation and management by telemedicine and the availability of in person appointments. The patient expressed understanding and agreed to proceed.     I discussed the assessment and treatment plan with the patient. The patient was provided an opportunity to ask questions and all were answered. The patient agreed with the plan and demonstrated an understanding of the instructions.   The patient was advised to call back or seek an in-person evaluation if the symptoms worsen or if the condition fails to improve as anticipated.  [place] MD OP Progress Note  05/02/2019 6:13 PM Haley Salazar  MRN:  [id number]  Chief Complaint:  Chief Complaint    Follow-up     HPI: Haley Salazar is a 24 year old Caucasian female, single, currently lives in in [place], employed, has a history of anxiety, sleep problems was evaluated by telemedicine today.  Patient was last seen on February 20, 2018.  Patient at that time was referred for psychotherapy sessions.  This is patient's second visit with Probation officer.  Patient today reports she has been struggling with depressive symptoms.  She reports she was started on Wellbutrin by her primary care provider sometime in July.  She reports for a few months she felt better.  She however reports since the past few months she has been decompensating again.  She struggles with sadness, crying spells, lack of motivation, low energy and fatigue.  She reports sleep is good.  She is able to sleep from around 8 PM to 6 AM.  She reports work as stressful.  She was able to graduate from [employer] and was able to get a job with [employer] for [employer] in [place].  She reports she  enjoys the work however she works long hours and also 6 days a week.  She reports that could also be contributing to her fatigue and tiredness.  Patient currently denies any active suicidal thoughts or plan however reports there has been fleeting thoughts about not wanting to be here because of her depressive symptoms.  She reports she currently does not have any social support system.  Since she has been working too much she reports a late relationship struggles with her friends.  Patient denies any substance abuse problems.  Patient has not been able to find a therapist yet however does have a therapist that she was referred to by a friend and is trying to make contact with the therapist.  She is motivated to do so.  Patient denies any other concerns today. Visit Diagnosis:    ICD-10-CM   1. MDD (major depressive disorder), recurrent episode, moderate (HCC)  F33.1 buPROPion (WELLBUTRIN XL) 300 MG 24 hr tablet  2. GAD (generalized anxiety disorder)  F41.1   3. Insomnia due to mental disorder  F51.05     Past Psychiatric History: I have reviewed past psychiatric history from my progress note on 02/20/2018.  Past trials of Prozac, Lexapro, Lamictal-noncompliant  Past Medical History:  Past Medical History:  Diagnosis Date  . Anxiety   . Asthma   . Depression   . GAD (generalized anxiety disorder) 04/10/2019  . Insomnia, psychophysiological 04/10/2019  . Presence of subdermal contraceptive implant 04/10/2019   Placed 02/08/2018; due out  02/08/2021 [place] clinic    Past Surgical History:  Procedure Laterality Date  . NOSE SURGERY      Family Psychiatric History: I have reviewed family psychiatric history from my progress note on 02/20/2018  Family History:  Family History  Problem Relation Age of Onset  . Depression Mother   . Alcohol abuse Paternal Aunt     Social History: I have reviewed social history from my progress note on 02/20/2018 Social History   Socioeconomic History   . Marital status: Single    Spouse name: Not on file  . Number of children: 0  . Years of education: Not on file  . Highest education level: Bachelor's degree (e.g., BA, AB, BS)  Occupational History  . 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 Occupation: [employer] for [employer]    Comment: Chief Operating Officer  Tobacco Use  . Smoking status: Never Smoker  . Smokeless tobacco: Never Used  Substance and Sexual Activity  . Alcohol use: No  . Drug use: No  . Sexual activity: Yes    Birth control/protection: Implant  Other Topics Concern  . Not on file  Social History Narrative   Mother is from [place];parents divorced when she was 65yo; estranged father. No siblings.    Social Determinants of Health   Financial Resource Strain:   . Difficulty of Paying Living Expenses:   Food Insecurity:   . Worried About Charity fundraiser in the Last Year:   . Arboriculturist in the Last Year:   Transportation Needs:   . Film/video editor (Medical):   Marland Kitchen Lack of Transportation (Non-Medical):   Physical Activity:   . Days of Exercise per Week:   . Minutes of Exercise per Session:   Stress:   . Feeling of Stress :   Social Connections:   . Frequency of Communication with Friends and Family:   . Frequency of Social Gatherings with Friends and Family:   . Attends Religious Services:   . Active Member of Clubs or Organizations:   . Attends Archivist Meetings:   Marland Kitchen Marital Status:     Allergies: No Known Allergies  Metabolic Disorder Labs: No results found for: HGBA1C, MPG No results found for: PROLACTIN No results found for: CHOL, TRIG, HDL, CHOLHDL, VLDL, LDLCALC Lab Results  Component Value Date   TSH 1.48 04/10/2019    Therapeutic Level Labs: No results found for: LITHIUM No results found for: VALPROATE No components found for:  CBMZ  Current Medications: Current Outpatient Medications  Medication Sig Dispense Refill  . buPROPion  (WELLBUTRIN XL) 300 MG 24 hr tablet Take 1 tablet (300 mg total) by mouth daily. 30 tablet 1  . dicyclomine (BENTYL) 10 MG capsule Take 1 capsule (10 mg total) by mouth 4 (four) times daily -  before meals and at bedtime. As needed for abdominal cramps 60 capsule 1  . etonogestrel (NEXPLANON) 68 MG IMPL implant Inject 1 each into the skin once.    . hydrOXYzine (ATARAX/VISTARIL) 25 MG tablet Take 0.5-1 tablets (12.5-25 mg total) by mouth 3 (three) times daily as needed for anxiety. 45 tablet 3  . Melatonin 10 MG TABS Take 10 mg by mouth as needed.    Marland Kitchen omeprazole (PRILOSEC) 20 MG capsule Take 1 capsule (20 mg total) by mouth daily. 30 capsule 3   No current facility-administered medications for this visit.     Musculoskeletal: Strength & Muscle Tone: UTA Gait & Station: normal Patient  leans: N/A  Psychiatric Specialty Exam: Review of Systems  Constitutional: Positive for fatigue.  Psychiatric/Behavioral: Positive for dysphoric mood.  All other systems reviewed and are negative.   There were no vitals taken for this visit.There is no height or weight on file to calculate BMI.  General Appearance: Casual  Eye Contact:  Fair  Speech:  Normal Rate  Volume:  Normal  Mood:  Depressed  Affect:  Congruent  Thought Process:  Goal Directed and Descriptions of Associations: Intact  Orientation:  Full (Time, Place, and Person)  Thought Content: Logical   Suicidal Thoughts:  No  Homicidal Thoughts:  No  Memory:  Immediate;   Fair Recent;   Fair Remote;   Fair  Judgement:  Fair  Insight:  Fair  Psychomotor Activity:  Normal  Concentration:  Concentration: Fair and Attention Span: Fair  Recall:  [employer] of Knowledge: Fair  Language: Fair  Akathisia:  No  Handed:  Right  AIMS (if indicated): UTA  Assets:  Communication Skills Desire for Improvement Housing Talents/Skills Transportation Vocational/Educational  ADL's:  Intact  Cognition: WNL  Sleep:  Fair    Screenings: PHQ2-9     Office Visit from 04/10/2019 in [place]  PHQ-2 Total Score  3  PHQ-9 Total Score  14       Assessment and Plan: Quinnly is a 24 year old Caucasian female who is single, lives in [place] employed, has a history of depression, anxiety was evaluated by telemedicine today.  Patient is currently struggling with depressive symptoms.  She does have psychosocial stressors of new job, not having enough social support system.  Patient is biologically predisposed given her family history of depression as well as history of trauma as well as difficult childhood.  Patient will benefit from psychotherapy session, previously was recommended DBT however has not followed through with recommendation.  She will also benefit from following medication changes.  Plan MDD single episode-unstable Increase Wellbutrin XL to 300 mg p.o. daily Discussed to start psychotherapy session, she is motivated to establish care with a therapist.  GAD-improving Hydroxyzine 12.5 to 25 mg p.o. 3 times daily as needed for severe anxiety attacks  Insomnia-stable Melatonin as needed  Follow-up in clinic in 3 weeks or sooner if needed.  I have spent atleast 20 minutes non face to face with patient today. More than 50 % of the time was spent for preparing to see the patient ( e.g., review of test, records ), obtaining and to review and separately obtained history , ordering medications and test ,psychoeducation and supportive psychotherapy and care coordination,as well as documenting clinical information in electronic health record. This note was generated in part or whole with voice recognition software. Voice recognition is usually quite accurate but there are transcription errors that can and very often do occur. I apologize for any typographical errors that were not detected and corrected.       Ursula Alert, MD 05/02/2019, 6:13 PM

## 2019-05-06 ENCOUNTER — Ambulatory Visit: Attending: Internal Medicine

## 2019-05-06 DIAGNOSIS — Z23 Encounter for immunization: Secondary | ICD-10-CM

## 2019-05-06 NOTE — Progress Notes (Signed)
   Covid-19 Vaccination Clinic  Name:  Waldene Hegger    MRN: [id number] DOB: [birth year]  05/06/2019  Ms. Morgenstern was observed post Covid-19 immunization for 15 minutes without incident. She was provided with Vaccine Information Sheet and instruction to access the V-Safe system.   Ms. Mansker was instructed to call 911 with any severe reactions post vaccine: Marland Kitchen Difficulty breathing  . Swelling of face and throat  . A fast heartbeat  . A bad rash all over body  . Dizziness and weakness   Immunizations Administered    Name Date Dose VIS Date Route   Pfizer COVID-19 Vaccine 05/06/2019  8:30 AM 0.3 mL 03/13/2018 Intramuscular   Manufacturer: [place]   Lot: [id number]   [place]: [id number]

## 2019-05-22 ENCOUNTER — Ambulatory Visit (INDEPENDENT_AMBULATORY_CARE_PROVIDER_SITE_OTHER): Admitting: Family Medicine

## 2019-05-22 ENCOUNTER — Other Ambulatory Visit: Payer: Self-pay

## 2019-05-22 ENCOUNTER — Encounter: Payer: Self-pay | Admitting: Family Medicine

## 2019-05-22 VITALS — BP 116/78 | HR 61 | Temp 98.6°F | Resp 14 | Ht 64.0 in | Wt 163.8 lb

## 2019-05-22 DIAGNOSIS — F32A Depression, unspecified: Secondary | ICD-10-CM

## 2019-05-22 DIAGNOSIS — F411 Generalized anxiety disorder: Secondary | ICD-10-CM | POA: Diagnosis not present

## 2019-05-22 DIAGNOSIS — R1084 Generalized abdominal pain: Secondary | ICD-10-CM | POA: Diagnosis not present

## 2019-05-22 DIAGNOSIS — F329 Major depressive disorder, single episode, unspecified: Secondary | ICD-10-CM

## 2019-05-22 DIAGNOSIS — G8929 Other chronic pain: Secondary | ICD-10-CM | POA: Diagnosis not present

## 2019-05-22 NOTE — Progress Notes (Signed)
Subjective  CC:  Chief Complaint  Patient presents with  . Gastroesophageal Reflux  . Abdominal Pain    bilateral pain after eating any foods, states Dicyclomine has not improved symptoms    HPI: Haley Salazar is a 24 y.o. female who presents to the office today to address the problems listed above in the chief complaint.  F/u abdominal pain: see last note. Chronic abdominal pain issues since childhood. We started PPI and bentyl for possible GERD/IBS related sxs. However, neither have made any difference in her sxs. Describes had postprandial pain limiting food intake. Doesn't eat healthy diet. Knows dairy bothers her. No new sxs. Neg lab work as noted below. Less n/v.   Psych: had f/u with psych who increased wellbutrin. Struggling with both anxiety and depressive sxs. ? Functional component to some of her GI sxs.    Assessment  1. Chronic generalized abdominal pain   2. GAD (generalized anxiety disorder)   3. Depressive disorder      Plan   abd pain:  No red flag sxs and ongoing for decades: trial of improving diet, small frequent meals, hydration and to GI if NOT improving. Pt agrees.   GAD/Depression: certainly could be contributing to GI sxs. Discussed. Pt to monitor. F/u with psych as recommended.   Follow up: Return in about 3 months (around 08/22/2019) for complete physical, recheck abdominal pain.  Visit date not found  No orders of the defined types were placed in this encounter.  No orders of the defined types were placed in this encounter.     I reviewed the patients updated PMH, FH, and SocHx.    Patient Active Problem List   Diagnosis Date Noted  . 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 Bipolar disorder with moderate depression ([place]) 04/16/2019  . GAD (generalized anxiety disorder) 04/10/2019  . Presence of subdermal contraceptive implant 04/10/2019  . Insomnia, psychophysiological 04/10/2019  . Depressive disorder 01/01/2016   Current Meds    Medication Sig  . buPROPion (WELLBUTRIN XL) 300 MG 24 hr tablet Take 1 tablet (300 mg total) by mouth daily.  Marland Kitchen dicyclomine (BENTYL) 10 MG capsule Take 1 capsule (10 mg total) by mouth 4 (four) times daily -  before meals and at bedtime. As needed for abdominal cramps  . etonogestrel (NEXPLANON) 68 MG IMPL implant Inject 1 each into the skin once.  . hydrOXYzine (ATARAX/VISTARIL) 25 MG tablet Take 0.5-1 tablets (12.5-25 mg total) by mouth 3 (three) times daily as needed for anxiety.  Marland Kitchen omeprazole (PRILOSEC) 20 MG capsule Take 1 capsule (20 mg total) by mouth daily.    Allergies: Patient has No Known Allergies. Family History: Patient family history includes Alcohol abuse in her paternal aunt; Depression in her mother. Social History:  Patient  reports that she has never smoked. She has never used smokeless tobacco. She reports that she does not drink alcohol or use drugs.  Review of Systems: Constitutional: Negative for fever malaise or anorexia Cardiovascular: negative for chest pain Respiratory: negative for SOB or persistent cough Gastrointestinal: negative for abdominal pain  Objective  Vitals: BP 116/78   Pulse 61   Temp 98.6 F (37 C) (Temporal)   Resp 14   Ht 5\' 4"  (1.626 m)   Wt 163 lb 12.8 oz (74.3 kg)   SpO2 99%   BMI 28.12 kg/m  General: no acute distress , A&Ox3 Psych: normal affect  No visits with results within 1 Day(s) from this visit.  Latest known  visit with results is:  Office Visit on 04/10/2019  Component Date Value Ref Range Status  . WBC 04/10/2019 7.5  4.0 - 10.5 K/uL Final  . RBC 04/10/2019 4.45  3.87 - 5.11 Mil/uL Final  . Hemoglobin 04/10/2019 13.1  12.0 - 15.0 g/dL Final  . HCT 04/10/2019 40.0  36.0 - 46.0 % Final  . MCV 04/10/2019 89.8  78.0 - 100.0 fl Final  . MCHC 04/10/2019 32.7  30.0 - 36.0 g/dL Final  . RDW 04/10/2019 13.2  11.5 - 15.5 % Final  . Platelets 04/10/2019 283.0  150.0 - 400.0 K/uL Final  . Neutrophils Relative % 04/10/2019  58.7  43.0 - 77.0 % Final  . Lymphocytes Relative 04/10/2019 33.4  12.0 - 46.0 % Final  . Monocytes Relative 04/10/2019 5.9  3.0 - 12.0 % Final  . Eosinophils Relative 04/10/2019 1.4  0.0 - 5.0 % Final  . Basophils Relative 04/10/2019 0.6  0.0 - 3.0 % Final  . Neutro Abs 04/10/2019 4.4  1.4 - 7.7 K/uL Final  . Lymphs Abs 04/10/2019 2.5  0.7 - 4.0 K/uL Final  . Monocytes Absolute 04/10/2019 0.4  0.1 - 1.0 K/uL Final  . Eosinophils Absolute 04/10/2019 0.1  0.0 - 0.7 K/uL Final  . Basophils Absolute 04/10/2019 0.0  0.0 - 0.1 K/uL Final  . Sodium 04/10/2019 140  135 - 145 mEq/L Final  . Potassium 04/10/2019 4.3  3.5 - 5.1 mEq/L Final  . Chloride 04/10/2019 106  96 - 112 mEq/L Final  . CO2 04/10/2019 27  19 - 32 mEq/L Final  . Glucose, Bld 04/10/2019 89  70 - 99 mg/dL Final  . BUN 04/10/2019 9  6 - 23 mg/dL Final  . Creatinine, Ser 04/10/2019 0.72  0.40 - 1.20 mg/dL Final  . Total Bilirubin 04/10/2019 0.5  0.2 - 1.2 mg/dL Final  . Alkaline Phosphatase 04/10/2019 65  39 - 117 U/L Final  . AST 04/10/2019 11  0 - 37 U/L Final  . ALT 04/10/2019 12  0 - 35 U/L Final  . Total Protein 04/10/2019 7.9  6.0 - 8.3 g/dL Final  . Albumin 04/10/2019 4.8  3.5 - 5.2 g/dL Final  . GFR 04/10/2019 100.44  >60.00 mL/min Final  . Calcium 04/10/2019 9.9  8.4 - 10.5 mg/dL Final  . TSH 04/10/2019 1.48  0.35 - 4.50 uIU/mL Final  . Lipase 04/10/2019 14.0  11.0 - 59.0 U/L Final  . H Pylori IgG 04/10/2019 Negative  Negative Final  . Antigliadin Abs, IgA 04/10/2019 6  0 - 19 units Final  . Gliadin IgG 04/10/2019 2  0 - 19 units Final  . Transglutaminase IgA 04/10/2019 <2  0 - 3 U/mL Final      Commons side effects, risks, benefits, and alternatives for medications and treatment plan prescribed today were discussed, and the patient expressed understanding of the given instructions. Patient is instructed to call or message via MyChart if he/she has any questions or concerns regarding our treatment plan. No barriers  to understanding were identified. We discussed Red Flag symptoms and signs in detail. Patient expressed understanding regarding what to do in case of urgent or emergency type symptoms.   Medication list was reconciled, printed and provided to the patient in AVS. Patient instructions and summary information was reviewed with the patient as documented in the AVS. This note was prepared with assistance of Dragon voice recognition software. Occasional wrong-word or sound-a-like substitutions may have occurred due to the inherent limitations of voice recognition  software  This visit occurred during the SARS-CoV-2 public health emergency.  Safety protocols were in place, including screening questions prior to the visit, additional usage of staff PPE, and extensive cleaning of exam room while observing appropriate contact time as indicated for disinfecting solutions.

## 2019-05-22 NOTE — Patient Instructions (Signed)
Please return in 2-3 months for your annual complete physical; please come fasting.  Start a daily vitamin B12 and Multivitamin daily.  Stop the omeprazole and dicyclomine. Start eating more frequent small healthy meals/snacks. Avoid highly processed foods, dairy, spicy foods. I'm hopeful this will help you feel better. Drink plenty of water daily.   If you have any questions or concerns, please don't hesitate to send me a message via MyChart or call the office at [phone]. Thank you for visiting with [place] today! It's our pleasure caring for you.

## 2019-06-03 ENCOUNTER — Other Ambulatory Visit: Payer: Self-pay

## 2019-06-03 ENCOUNTER — Telehealth (INDEPENDENT_AMBULATORY_CARE_PROVIDER_SITE_OTHER): Admitting: Psychiatry

## 2019-06-03 DIAGNOSIS — Z5329 Procedure and treatment not carried out because of patient's decision for other reasons: Secondary | ICD-10-CM

## 2019-06-03 NOTE — Progress Notes (Signed)
No response to video invite , phone call - left VM.

## 2019-06-04 ENCOUNTER — Telehealth (INDEPENDENT_AMBULATORY_CARE_PROVIDER_SITE_OTHER): Admitting: Psychiatry

## 2019-06-04 ENCOUNTER — Encounter: Payer: Self-pay | Admitting: Psychiatry

## 2019-06-04 DIAGNOSIS — F5105 Insomnia due to other mental disorder: Secondary | ICD-10-CM | POA: Diagnosis not present

## 2019-06-04 DIAGNOSIS — F331 Major depressive disorder, recurrent, moderate: Secondary | ICD-10-CM | POA: Diagnosis not present

## 2019-06-04 DIAGNOSIS — F411 Generalized anxiety disorder: Secondary | ICD-10-CM

## 2019-06-04 MED ORDER — BUPROPION HCL ER (XL) 300 MG PO TB24: 300.0000 mg | ORAL_TABLET | Freq: Every day | ORAL | 1 refills | Status: DC

## 2019-06-04 MED ORDER — HYDROXYZINE HCL 25 MG PO TABS: 12.5000 mg | ORAL_TABLET | Freq: Three times a day (TID) | ORAL | 3 refills | Status: DC | PRN

## 2019-06-04 NOTE — Progress Notes (Signed)
Provider Location : [facility] Patient Location : work  Hospital doctor Visit via Video Note  I connected with Haley Salazar on 06/04/19 at  1:30 PM EDT by a video enabled telemedicine application and verified that I am speaking with the correct person using two identifiers.   I discussed the limitations of evaluation and management by telemedicine and the availability of in person appointments. The patient expressed understanding and agreed to proceed.   I discussed the assessment and treatment plan with the patient. The patient was provided an opportunity to ask questions and all were answered. The patient agreed with the plan and demonstrated an understanding of the instructions.   The patient was advised to call back or seek an in-person evaluation if the symptoms worsen or if the condition fails to improve as anticipated.   [place] MD OP Progress Note  06/04/2019 2:04 PM Haley Salazar  MRN:  [id number]  Chief Complaint:  Chief Complaint    Follow-up     HPI: Haley Salazar is a 24 year old Caucasian female, single, currently lives in [place], employed, has a history of anxiety, sleep problems was evaluated by telemedicine today.  Patient today reports work continues to be busy.  She reports she feels overwhelmed on and off however she is coping well.  She reports she had a good birthday since her coworkers were extremely generous and she enjoyed her day.  Patient today reports since being on the Wellbutrin she has been doing well.  Her depressive symptoms are improving.  She continues to feel sad on and off however she is better able to cope with it.  She reports she had a recent appointment with her primary care provider.  She discussed it with her primary care that she is having nausea when she eats certain foods.  She reports she currently eats only rice and pasta.  She reports when she eats certain foods like egg she has to throw up or feels nauseous.  She  avoids certain kind  of food.  She reports her primary care provider felt it may be anxiety related.  She however reports she has these kind of symptoms even when she is not anxious.  She explained that she was on vacation with her friends when she had a good breakfast and she ate something that she normally does not eat and threw up later on.  She reports her primary care provider has discussed with her that she can be referred to a gastroenterologist if she continues to have these kind of symptoms.  Patient denies any suicidality, homicidality or perceptual disturbances.  She did have an appointment with her therapist however she did not feel it was a good fit.  She is currently working on finding another one.  Patient has signed up for grad school classes and looks forward to starting that in summer.  Patient denies any other concerns today.  Visit Diagnosis:    ICD-10-CM   1. MDD (major depressive disorder), recurrent episode, moderate (HCC)  F33.1 buPROPion (WELLBUTRIN XL) 300 MG 24 hr tablet   improving  2. GAD (generalized anxiety disorder)  F41.1 hydrOXYzine (ATARAX/VISTARIL) 25 MG tablet  3. Insomnia due to mental disorder  F51.05     Past Psychiatric History: I have reviewed past psychiatric history from my progress note on 02/20/2018.  Past trials of Prozac, Lexapro, Lamictal-noncompliant  Past Medical History:  Past Medical History:  Diagnosis Date  . Anxiety   . Asthma   . Depression   .  GAD (generalized anxiety disorder) 04/10/2019  . Insomnia, psychophysiological 04/10/2019  . Presence of subdermal contraceptive implant 04/10/2019   Placed 02/08/2018; due out 02/08/2021 [facility]    Past Surgical History:  Procedure Laterality Date  . NOSE SURGERY      Family Psychiatric History: I have reviewed family psychiatric history from my progress note on 02/20/2018  Family History:  Family History  Problem Relation Age of Onset  . Depression Mother   . Alcohol abuse Paternal Aunt      Social History: I have reviewed social history from my progress note on 02/20/2018 Social History   Socioeconomic History  . Marital status: Single    Spouse name: Not on file  . Number of children: 0  . Years of education: Not on file  . Highest education level: Bachelor's degree (e.g., BA, AB, BS)  Occupational History  . 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 Occupation: [employer] for [employer]    Comment: Chief Operating Officer  Tobacco Use  . Smoking status: Never Smoker  . Smokeless tobacco: Never Used  Substance and Sexual Activity  . Alcohol use: No  . Drug use: No  . Sexual activity: Yes    Birth control/protection: Implant  Other Topics Concern  . Not on file  Social History Narrative   Mother is from [place];parents divorced when she was 66yo; estranged father. No siblings.    Social Determinants of Health   Financial Resource Strain:   . Difficulty of Paying Living Expenses:   Food Insecurity:   . Worried About Charity fundraiser in the Last Year:   . Arboriculturist in the Last Year:   Transportation Needs:   . Film/video editor (Medical):   Marland Kitchen Lack of Transportation (Non-Medical):   Physical Activity:   . Days of Exercise per Week:   . Minutes of Exercise per Session:   Stress:   . Feeling of Stress :   Social Connections:   . Frequency of Communication with Friends and Family:   . Frequency of Social Gatherings with Friends and Family:   . Attends Religious Services:   . Active Member of Clubs or Organizations:   . Attends Archivist Meetings:   Marland Kitchen Marital Status:     Allergies: No Known Allergies  Metabolic Disorder Labs: No results found for: HGBA1C, MPG No results found for: PROLACTIN No results found for: CHOL, TRIG, HDL, CHOLHDL, VLDL, LDLCALC Lab Results  Component Value Date   TSH 1.48 04/10/2019    Therapeutic Level Labs: No results found for: LITHIUM No results found for: VALPROATE No  components found for:  CBMZ  Current Medications: Current Outpatient Medications  Medication Sig Dispense Refill  . buPROPion (WELLBUTRIN XL) 300 MG 24 hr tablet Take 1 tablet (300 mg total) by mouth daily. 30 tablet 1  . dicyclomine (BENTYL) 10 MG capsule Take 1 capsule (10 mg total) by mouth 4 (four) times daily -  before meals and at bedtime. As needed for abdominal cramps 60 capsule 1  . etonogestrel (NEXPLANON) 68 MG IMPL implant Inject 1 each into the skin once.    . hydrOXYzine (ATARAX/VISTARIL) 25 MG tablet Take 0.5-1 tablets (12.5-25 mg total) by mouth 3 (three) times daily as needed for anxiety. 45 tablet 3  . Melatonin 10 MG TABS Take 10 mg by mouth as needed.    Marland Kitchen omeprazole (PRILOSEC) 20 MG capsule Take 1 capsule (20 mg total) by mouth daily. 30 capsule 3  No current facility-administered medications for this visit.     Musculoskeletal: Strength & Muscle Tone: UTA Gait & Station: normal Patient leans: N/A  Psychiatric Specialty Exam: Review of Systems  Gastrointestinal: Positive for nausea.  Psychiatric/Behavioral: The patient is nervous/anxious.   All other systems reviewed and are negative.   There were no vitals taken for this visit.There is no height or weight on file to calculate BMI.  General Appearance: Casual  Eye Contact:  Fair  Speech:  Normal Rate  Volume:  Normal  Mood:  Anxious coping okay  Affect:  Congruent  Thought Process:  Goal Directed and Descriptions of Associations: Intact  Orientation:  Full (Time, Place, and Person)  Thought Content: Logical   Suicidal Thoughts:  No  Homicidal Thoughts:  No  Memory:  Immediate;   Fair Recent;   Fair Remote;   Fair  Judgement:  Fair  Insight:  Fair  Psychomotor Activity:  Normal  Concentration:  Concentration: Fair and Attention Span: Fair  Recall:  [employer] of Knowledge: Fair  Language: Fair  Akathisia:  No  Handed:  Left  AIMS (if indicated): UTA  Assets:  Communication Skills Desire for  Improvement Housing Resilience Social Support Talents/Skills Transportation Vocational/Educational  ADL's:  Intact  Cognition: WNL  Sleep:  Fair   Screenings: PHQ2-9     Office Visit from 04/10/2019 in [place]  PHQ-2 Total Score  3  PHQ-9 Total Score  14       Assessment and Plan: Haley Salazar is a 24 year old Caucasian female who is single, lives in [place], employed, has a history of anxiety, depression was evaluated by telemedicine today.  Patient is currently making progress however does report unspecified problems with her eating habits.  Unknown if she does have eating disorder however her symptoms are atypical and unspecified.  We will monitor closely.  Patient advised to establish care with a therapist and she will continue to need medication management.  Plan MDD-improving Wellbutrin XL 300 mg p.o. daily  GAD-improving Hydroxyzine 12.5 to 25 mg p.o. 3 times daily as needed for severe anxiety attacks  Insomnia-stable We will monitor closely  Patient does report nausea and vomiting when she eats certain kind of food which has been going on since the past several years and may be getting worse.  Patient advised to use hydroxyzine as needed when she feels anxious/nauseous.  Patient however reports she has the nausea even when she is not anxious.  She will continue to follow-up with her primary care provider who has discussed gastroenterology referral.  Follow-up in clinic in 6 to 8 weeks or sooner if needed.  I have spent atleast 20 minutes non face to face with patient today. More than 50 % of the time was spent for preparing to see the patient ( e.g., review of test, records ), obtaining and to review and separately obtained history , ordering medications and test ,psychoeducation and supportive psychotherapy and care coordination,as well as documenting clinical information in electronic health record. This note was generated in part or  whole with voice recognition software. Voice recognition is usually quite accurate but there are transcription errors that can and very often do occur. I apologize for any typographical errors that were not detected and corrected.        Ursula Alert, MD 06/04/2019, 2:04 PM

## 2019-07-18 ENCOUNTER — Telehealth (INDEPENDENT_AMBULATORY_CARE_PROVIDER_SITE_OTHER): Admitting: Psychiatry

## 2019-07-18 ENCOUNTER — Other Ambulatory Visit: Payer: Self-pay

## 2019-07-18 ENCOUNTER — Encounter: Payer: Self-pay | Admitting: Psychiatry

## 2019-07-18 DIAGNOSIS — F5105 Insomnia due to other mental disorder: Secondary | ICD-10-CM

## 2019-07-18 DIAGNOSIS — F331 Major depressive disorder, recurrent, moderate: Secondary | ICD-10-CM | POA: Diagnosis not present

## 2019-07-18 DIAGNOSIS — F411 Generalized anxiety disorder: Secondary | ICD-10-CM | POA: Diagnosis not present

## 2019-07-18 MED ORDER — BUPROPION HCL ER (XL) 300 MG PO TB24: 300.0000 mg | ORAL_TABLET | Freq: Every day | ORAL | 1 refills | Status: DC

## 2019-07-18 MED ORDER — ESCITALOPRAM OXALATE 5 MG PO TABS: 5.0000 mg | ORAL_TABLET | Freq: Every day | ORAL | 1 refills | Status: DC

## 2019-07-18 NOTE — Progress Notes (Signed)
Provider Location : [facility] Patient Location : [place]  Virtual Visit via Video Note  I connected with Haley Salazar on 07/18/19 at  4:20 PM EDT by a video enabled telemedicine application and verified that I am speaking with the correct person using two identifiers.   I discussed the limitations of evaluation and management by telemedicine and the availability of in person appointments. The patient expressed understanding and agreed to proceed.   I discussed the assessment and treatment plan with the patient. The patient was provided an opportunity to ask questions and all were answered. The patient agreed with the plan and demonstrated an understanding of the instructions.   The patient was advised to call back or seek an in-person evaluation if the symptoms worsen or if the condition fails to improve as anticipated.   [facility] MD OP Progress Note  07/18/2019 5:25 PM [name]  MRN:  [id number]  Chief Complaint:  Chief Complaint    Follow-up     HPI: Haley Salazar is a 24 year old Caucasian female, single, currently lives in [place], employed, has a history of depression, anxiety, sleep problems was evaluated by telemedicine today.  Patient today reports she is currently struggling with anxiety and depressive symptoms.  She reports she continues to have a lot of work-related stressors.  She reports she feels as though she is not getting anything accomplished at her work due to the current organizational problems at her work.  She reports she hence is trying to get another job.  She is looking all over.  She reports when she reaches back home from work she is unable to do anything.  She goes to bed early around 7 PM and wakes up at 5 AM the next day.  She does report she spends time with friends on the weekends and that seems to be going okay.  Patient reports she had to reschedule her appointment with her therapist with whom she got established with recently.  She  reports her stepfather's father died and she had to go to [place] at that time.  She also reports her uncle in [place] died recently.  She feels guilty about this since she had not returned any of his messages recently.    Patient denies any suicidality, homicidality or perceptual disturbances.  Patient reports sleep is excessive as noted above.  Patient reports appetite is fair.  Patient denies any other concerns today.  Visit Diagnosis:    ICD-10-CM   1. MDD (major depressive disorder), recurrent episode, moderate (HCC)  F33.1 escitalopram (LEXAPRO) 5 MG tablet  2. GAD (generalized anxiety disorder)  F41.1 buPROPion (WELLBUTRIN XL) 300 MG 24 hr tablet  3. Insomnia due to mental disorder  F51.05     Past Psychiatric History: I have reviewed past psychiatric history from my progress note on 02/20/2018.  Past trials of Prozac, Lexapro, Lamictal-noncompliant  Past Medical History:  Past Medical History:  Diagnosis Date   Anxiety    Asthma    Depression    GAD (generalized anxiety disorder) 04/10/2019   Insomnia, psychophysiological 04/10/2019   Presence of subdermal contraceptive implant 04/10/2019   Placed 02/08/2018; due out 02/08/2021 [facility] clinic    Past Surgical History:  Procedure Laterality Date   NOSE SURGERY      Family Psychiatric History: I have reviewed family psychiatric history from my progress note on 02/20/2018  Family History:  Family History  Problem Relation Age of Onset   Depression Mother    Alcohol abuse Paternal Aunt  Social History: I have reviewed social history from my progress note on 02/20/2018 Social History   Socioeconomic History   Marital status: Single    Spouse name: Not on file   Number of children: 0   Years of education: Not on file   Highest education level: Bachelor's degree (e.g., BA, AB, BS)  Occupational History   Occupation: [employer] grad 2020   Occupation: [employer] for [employer]    Comment: Passenger transport manager  Tobacco Use   Smoking status: Never Smoker   Smokeless tobacco: Never Used  Scientific laboratory technician Use: Never used  Substance and Sexual Activity   Alcohol use: No   Drug use: No   Sexual activity: Yes    Birth control/protection: Implant  Other Topics Concern   Not on file  Social History Narrative   Mother is from [place];parents divorced when she was 89yo; estranged father. No siblings.    Social Determinants of Health   Financial Resource Strain:    Difficulty of Paying Living Expenses:   Food Insecurity:    Worried About Charity fundraiser in the Last Year:    Arboriculturist in the Last Year:   Transportation Needs:    Film/video editor (Medical):    Lack of Transportation (Non-Medical):   Physical Activity:    Days of Exercise per Week:    Minutes of Exercise per Session:   Stress:    Feeling of Stress :   Social Connections:    Frequency of Communication with Friends and Family:    Frequency of Social Gatherings with Friends and Family:    Attends Religious Services:    Active Member of Clubs or Organizations:    Attends Music therapist:    Marital Status:     Allergies: No Known Allergies  Metabolic Disorder Labs: No results found for: HGBA1C, MPG No results found for: PROLACTIN No results found for: CHOL, TRIG, HDL, CHOLHDL, VLDL, LDLCALC Lab Results  Component Value Date   TSH 1.48 04/10/2019    Therapeutic Level Labs: No results found for: LITHIUM No results found for: VALPROATE No components found for:  CBMZ  Current Medications: Current Outpatient Medications  Medication Sig Dispense Refill   buPROPion (WELLBUTRIN XL) 300 MG 24 hr tablet Take 1 tablet (300 mg total) by mouth daily. 30 tablet 1   dicyclomine (BENTYL) 10 MG capsule Take 1 capsule (10 mg total) by mouth 4 (four) times daily -  before meals and at bedtime. As needed for abdominal cramps 60 capsule 1   escitalopram  (LEXAPRO) 5 MG tablet Take 1 tablet (5 mg total) by mouth daily. 30 tablet 1   etonogestrel (NEXPLANON) 68 MG IMPL implant Inject 1 each into the skin once.     hydrOXYzine (ATARAX/VISTARIL) 25 MG tablet Take 0.5-1 tablets (12.5-25 mg total) by mouth 3 (three) times daily as needed for anxiety. 45 tablet 3   Melatonin 10 MG TABS Take 10 mg by mouth as needed.     omeprazole (PRILOSEC) 20 MG capsule Take 1 capsule (20 mg total) by mouth daily. 30 capsule 3   No current facility-administered medications for this visit.     Musculoskeletal: Strength & Muscle Tone: UTA Gait & Station: normal Patient leans: N/A  Psychiatric Specialty Exam: Review of Systems  Psychiatric/Behavioral: Positive for dysphoric mood. The patient is nervous/anxious.   All other systems reviewed and are negative.   There were no vitals taken for  this visit.There is no height or weight on file to calculate BMI.  General Appearance: Casual  Eye Contact:  Fair  Speech:  Clear and Coherent  Volume:  Normal  Mood:  Anxious and Depressed  Affect:  Congruent  Thought Process:  Goal Directed and Descriptions of Associations: Intact  Orientation:  Full (Time, Place, and Person)  Thought Content: Logical   Suicidal Thoughts:  No  Homicidal Thoughts:  No  Memory:  Immediate;   Fair Recent;   Fair Remote;   Fair  Judgement:  Fair  Insight:  Fair  Psychomotor Activity:  Normal  Concentration:  Concentration: Fair and Attention Span: Fair  Recall:  [employer] of Knowledge: Fair  Language: Fair  Akathisia:  No  Handed:  Right  AIMS (if indicated): UTA  Assets:  Communication Skills Desire for Improvement Housing Social Support  ADL's:  Intact  Cognition: WNL  Sleep:  Fair excessive at times   Screenings: PHQ2-9     Office Visit from 04/10/2019 in [place]  PHQ-2 Total Score 3  PHQ-9 Total Score 14       Assessment and Plan: Airis Barbee is a 24 year old Caucasian  female who is single, lives in [place], employed, has a history of depression was evaluated by telemedicine today.  Patient is currently struggling with depressive symptoms.  She does have multiple psychosocial stressors.  Discussed plan as noted below.  Plan MDD-unstable Wellbutrin XL 300 mg p.o. daily Start Lexapro 5 mg p.o. daily. She tolerated Lexapro in the past.  GAD-improving Hydroxyzine 12.5 to 25 mg p.o. 3 times daily as needed for severe anxiety attacks Lexapro will also help   Insomnia-stable Will monitor closely  Patient advised to establish care with therapist and start psychotherapy sessions.  Follow-up in clinic in 4 to 6 weeks or sooner if needed.  I have spent atleast 20 minutes non face to face with patient today. More than 50 % of the time was spent for preparing to see the patient ( e.g., review of test, records ), ordering medications and test ,psychoeducation and supportive psychotherapy and care coordination,as well as documenting clinical information in electronic health record. This note was generated in part or whole with voice recognition software. Voice recognition is usually quite accurate but there are transcription errors that can and very often do occur. I apologize for any typographical errors that were not detected and corrected.      Ursula Alert, MD 07/18/2019, 5:25 PM

## 2019-08-23 ENCOUNTER — Telehealth: Admitting: Psychiatry

## 2019-09-11 ENCOUNTER — Encounter: Payer: Self-pay | Admitting: Psychiatry

## 2019-09-11 ENCOUNTER — Other Ambulatory Visit: Payer: Self-pay

## 2019-09-11 ENCOUNTER — Telehealth (INDEPENDENT_AMBULATORY_CARE_PROVIDER_SITE_OTHER): Admitting: Psychiatry

## 2019-09-11 DIAGNOSIS — F411 Generalized anxiety disorder: Secondary | ICD-10-CM | POA: Diagnosis not present

## 2019-09-11 DIAGNOSIS — F331 Major depressive disorder, recurrent, moderate: Secondary | ICD-10-CM | POA: Diagnosis not present

## 2019-09-11 DIAGNOSIS — F5105 Insomnia due to other mental disorder: Secondary | ICD-10-CM | POA: Diagnosis not present

## 2019-09-11 MED ORDER — ESCITALOPRAM OXALATE 10 MG PO TABS: 10.0000 mg | ORAL_TABLET | Freq: Every day | ORAL | 1 refills | Status: DC

## 2019-09-11 MED ORDER — BUPROPION HCL ER (XL) 300 MG PO TB24: 300.0000 mg | ORAL_TABLET | Freq: Every day | ORAL | 1 refills | Status: DC

## 2019-09-11 NOTE — Progress Notes (Signed)
Provider Location : [facility] Patient Location : [place]  Participants: Patient , Provider  Virtual Visit via Video Note  I connected with Haley Salazar on 09/11/19 at  3:15 PM EDT by a video enabled telemedicine application and verified that I am speaking with the correct person using two identifiers.   I discussed the limitations of evaluation and management by telemedicine and the availability of in person appointments. The patient expressed understanding and agreed to proceed.    I discussed the assessment and treatment plan with the patient. The patient was provided an opportunity to ask questions and all were answered. The patient agreed with the plan and demonstrated an understanding of the instructions.   The patient was advised to call back or seek an in-person evaluation if the symptoms worsen or if the condition fails to improve as anticipated.  [place] MD OP Progress Note  09/11/2019 6:21 PM Haley Salazar  MRN:  [id number]  Chief Complaint:  Chief Complaint    Follow-up     HPI: Haley Salazar is a 24 year old Caucasian female, single, lives in [place], employed, has a history of depression, anxiety, sleep problems was evaluated by telemedicine today.  Patient today reports she is currently recovering from COVID-19 infection.  She reports she had severe symptoms however currently is back at work.  She continues to struggle with chronic fatigue.  She reports she however is managing okay.  She has noticed some excessive sleepiness however she is able to stay up during the day however goes to bed early and has been sleeping more hours.  Patient reports her work has been very understanding.  She reports she is compliant on medications as prescribed.  She however reports with all the current stressors she feels more overwhelmed and has noticed some depression and anxiety symptoms.  Patient denies any suicidality, homicidality or perceptual disturbances.  She denies any  side effects to medications.  She has started psychotherapy sessions with the therapist at [facility].  She reports therapy sessions as going well.  Visit Diagnosis:    ICD-10-CM   1. MDD (major depressive disorder), recurrent episode, moderate (HCC)  F33.1 escitalopram (LEXAPRO) 10 MG tablet  2. GAD (generalized anxiety disorder)  F41.1 buPROPion (WELLBUTRIN XL) 300 MG 24 hr tablet    escitalopram (LEXAPRO) 10 MG tablet  3. Insomnia due to mental disorder  F51.05     Past Psychiatric History: I have reviewed past psychiatric history from my progress note on 02/20/2018.  Past trials of Prozac, Lexapro, Lamictal-noncompliant  Past Medical History:  Past Medical History:  Diagnosis Date   Anxiety    Asthma    Depression    GAD (generalized anxiety disorder) 04/10/2019   Insomnia, psychophysiological 04/10/2019   Presence of subdermal contraceptive implant 04/10/2019   Placed 02/08/2018; due out 02/08/2021 [facility] clinic    Past Surgical History:  Procedure Laterality Date   NOSE SURGERY      Family Psychiatric History: I have reviewed family psychiatric history from my progress note on 02/20/2018  Family History:  Family History  Problem Relation Age of Onset   Depression Mother    Alcohol abuse Paternal Aunt     Social History: I have reviewed social history from my progress note on 02/20/2018 Social History   Socioeconomic History   Marital status: Single    Spouse name: Not on file   Number of children: 0   Years of education: Not on file   Highest education level: Bachelor's degree (  e.g., BA, AB, BS)  Occupational History   Occupation: [employer] grad 2020   Occupation: [employer] for [employer]    Comment: Chief Operating Officer  Tobacco Use   Smoking status: Never Smoker   Smokeless tobacco: Never Used  Scientific laboratory technician Use: Never used  Substance and Sexual Activity   Alcohol use: No   Drug use: No   Sexual  activity: Yes    Birth control/protection: Implant  Other Topics Concern   Not on file  Social History Narrative   Mother is from [place];parents divorced when she was 35yo; estranged father. No siblings.    Social Determinants of Health   Financial Resource Strain:    Difficulty of Paying Living Expenses: Not on file  Food Insecurity:    Worried About Charity fundraiser in the Last Year: Not on file   [employer] of Food in the Last Year: Not on file  Transportation Needs:    Lack of Transportation (Medical): Not on file   Lack of Transportation (Non-Medical): Not on file  Physical Activity:    Days of Exercise per Week: Not on file   Minutes of Exercise per Session: Not on file  Stress:    Feeling of Stress : Not on file  Social Connections:    Frequency of Communication with Friends and Family: Not on file   Frequency of Social Gatherings with Friends and Family: Not on file   Attends Religious Services: Not on file   Active Member of Clubs or Organizations: Not on file   Attends Archivist Meetings: Not on file   Marital Status: Not on file    Allergies: No Known Allergies  Metabolic Disorder Labs: No results found for: HGBA1C, MPG No results found for: PROLACTIN No results found for: CHOL, TRIG, HDL, CHOLHDL, VLDL, LDLCALC Lab Results  Component Value Date   TSH 1.48 04/10/2019    Therapeutic Level Labs: No results found for: LITHIUM No results found for: VALPROATE No components found for:  CBMZ  Current Medications: Current Outpatient Medications  Medication Sig Dispense Refill   buPROPion (WELLBUTRIN XL) 300 MG 24 hr tablet Take 1 tablet (300 mg total) by mouth daily. 30 tablet 1   dicyclomine (BENTYL) 10 MG capsule Take 1 capsule (10 mg total) by mouth 4 (four) times daily -  before meals and at bedtime. As needed for abdominal cramps 60 capsule 1   escitalopram (LEXAPRO) 10 MG tablet Take 1 tablet (10 mg total) by mouth daily. 30  tablet 1   etonogestrel (NEXPLANON) 68 MG IMPL implant Inject 1 each into the skin once.     hydrOXYzine (ATARAX/VISTARIL) 25 MG tablet Take 0.5-1 tablets (12.5-25 mg total) by mouth 3 (three) times daily as needed for anxiety. 45 tablet 3   Melatonin 10 MG TABS Take 10 mg by mouth as needed.     omeprazole (PRILOSEC) 20 MG capsule Take 1 capsule (20 mg total) by mouth daily. 30 capsule 3   No current facility-administered medications for this visit.     Musculoskeletal: Strength & Muscle Tone: UTA Gait & Station: normal Patient leans: N/A  Psychiatric Specialty Exam: Review of Systems  Constitutional: Positive for fatigue.  Psychiatric/Behavioral: Positive for dysphoric mood and sleep disturbance. The patient is nervous/anxious.   All other systems reviewed and are negative.   There were no vitals taken for this visit.There is no height or weight on file to calculate BMI.  General Appearance: Casual  Eye Contact:  Fair  Speech:  Normal Rate  Volume:  Normal  Mood:  Anxious and Depressed  Affect:  Congruent  Thought Process:  Goal Directed and Descriptions of Associations: Intact  Orientation:  Full (Time, Place, and Person)  Thought Content: Logical   Suicidal Thoughts:  No  Homicidal Thoughts:  No  Memory:  Immediate;   Fair Recent;   Fair Remote;   Fair  Judgement:  Fair  Insight:  Fair  Psychomotor Activity:  Normal  Concentration:  Concentration: Fair and Attention Span: Fair  Recall:  [employer] of Knowledge: Fair  Language: Fair  Akathisia:  No  Handed:  Right  AIMS (if indicated): UTA  Assets:  Communication Skills Desire for Improvement Housing Social Support  ADL's:  Intact  Cognition: WNL  Sleep:  Sleeping more   Screenings: PHQ2-9     Office Visit from 04/10/2019 in [place]  PHQ-2 Total Score 3  PHQ-9 Total Score 14       Assessment and Plan: Haley Salazar is a 24 year old Caucasian female, employed,  single, lives in [place] was evaluated by telemedicine today.  Patient is currently recovering from COVID-19 infection and does report depression and anxiety symptoms.  Plan as noted below.  Plan MDD-unstable Wellbutrin XL 300 mg p.o. daily Increase Lexapro to 10 mg p.o. daily  GAD-some progress Hydroxyzine 12.5 to 25 mg p.o. 3 times daily as needed Increase Lexapro to 10 mg p.o. daily Continue CBT with therapist with [facility].  Insomnia-improved We will monitor closely.  Patient currently reports excessive sleep more so because of her long-term symptoms from recent COVID-19 infection.  Follow-up in clinic in 4 weeks or sooner if needed.  I have spent atleast 20 minutes  face to face with patient today. More than 50 % of the time was spent for ordering medications and test ,psychoeducation and supportive psychotherapy and care coordination,as well as documenting clinical information in electronic health record. This note was generated in part or whole with voice recognition software. Voice recognition is usually quite accurate but there are transcription errors that can and very often do occur. I apologize for any typographical errors that were not detected and corrected.         Ursula Alert, MD 09/11/2019, 6:21 PM

## 2019-09-13 ENCOUNTER — Encounter: Admitting: Family Medicine

## 2019-10-07 ENCOUNTER — Telehealth: Payer: Self-pay

## 2019-10-07 NOTE — Telephone Encounter (Signed)
Pt's therapist recommended that pt see a nutritionist. Pt is asking for a recommendation.

## 2019-10-07 NOTE — Telephone Encounter (Signed)
Pt has chronic abdominal pain; we discussed this in the past. If persistent and that is affecting her nutrition, then I recommend a GI referral. Please call her to clarify.  Thanks.

## 2019-10-07 NOTE — Telephone Encounter (Signed)
OK for referral to nutrition education? Please advise

## 2019-10-08 ENCOUNTER — Other Ambulatory Visit: Payer: Self-pay

## 2019-10-08 DIAGNOSIS — E639 Nutritional deficiency, unspecified: Secondary | ICD-10-CM

## 2019-10-08 DIAGNOSIS — G8929 Other chronic pain: Secondary | ICD-10-CM

## 2019-10-08 NOTE — Telephone Encounter (Signed)
Please place referral.s thanks.

## 2019-10-08 NOTE — Telephone Encounter (Signed)
Patient states she would like both GI and nutrition referrals.

## 2019-10-08 NOTE — Telephone Encounter (Signed)
Referrals placed 

## 2019-10-23 ENCOUNTER — Encounter: Payer: Self-pay | Admitting: Psychiatry

## 2019-10-23 ENCOUNTER — Other Ambulatory Visit: Payer: Self-pay

## 2019-10-23 ENCOUNTER — Telehealth (INDEPENDENT_AMBULATORY_CARE_PROVIDER_SITE_OTHER): Admitting: Psychiatry

## 2019-10-23 DIAGNOSIS — F411 Generalized anxiety disorder: Secondary | ICD-10-CM

## 2019-10-23 DIAGNOSIS — F331 Major depressive disorder, recurrent, moderate: Secondary | ICD-10-CM | POA: Diagnosis not present

## 2019-10-23 DIAGNOSIS — F5105 Insomnia due to other mental disorder: Secondary | ICD-10-CM

## 2019-10-23 MED ORDER — LAMOTRIGINE 25 MG PO TBDP: ORAL_TABLET | ORAL | 0 refills | Status: DC

## 2019-10-23 MED ORDER — BUPROPION HCL ER (XL) 300 MG PO TB24: 300.0000 mg | ORAL_TABLET | Freq: Every day | ORAL | 1 refills | Status: DC

## 2019-10-23 MED ORDER — ESCITALOPRAM OXALATE 10 MG PO TABS: 10.0000 mg | ORAL_TABLET | Freq: Every day | ORAL | 1 refills | Status: DC

## 2019-10-23 NOTE — Progress Notes (Signed)
Provider Location : [facility] Patient Location :Work  Participants: Patient , Provider Virtual Visit via Video Note  I connected with Haley Salazar on 10/23/19 at 10:40 AM EDT by a video enabled telemedicine application and verified that I am speaking with the correct person using two identifiers.   I discussed the limitations of evaluation and management by telemedicine and the availability of in person appointments. The patient expressed understanding and agreed to proceed.     I discussed the assessment and treatment plan with the patient. The patient was provided an opportunity to ask questions and all were answered. The patient agreed with the plan and demonstrated an understanding of the instructions.   The patient was advised to call back or seek an in-person evaluation if the symptoms worsen or if the condition fails to improve as anticipated.  BH MD/PA/NP OP Progress Note  10/23/2019 5:42 PM Haley Salazar  MRN:  [id number]  Chief Complaint:  Chief Complaint    Follow-up     HPI: Haley Salazar is a 24 year old Caucasian female, single, lives in [place], employed, has a history of depression, anxiety, sleep problems was evaluated by telemedicine today.  Patient today reports she is currently struggling with job related stressors.  She reports she does not know if this is the job that she wants to keep doing.  She reports she currently struggles with a lot of anxiety, mood swings, low energy at times, excessive sleepiness during the day, restlessness at night and so on.  This has been going on since the past several weeks.  Patient reports she currently does not have any suicidality.  Patient denies any perceptual disturbances or homicidality.  Patient reports she continues to follow-up with her therapist.  She reports her therapist did bring to her attention that she could be having hypomanic symptoms.  However patient reports she was engaged in more activities  recently however that was also because of what she had to do during that time and not something that she may have done due to increased energy at that time.  She however will keep track of her symptoms and will let writer know.  Discussed starting Lamictal and she is agreeable.    Visit Diagnosis:    ICD-10-CM   1. MDD (major depressive disorder), recurrent episode, moderate (HCC)  F33.1 lamotrigine (LAMICTAL ODT) 25 MG disintegrating tablet    escitalopram (LEXAPRO) 10 MG tablet  2. GAD (generalized anxiety disorder)  F41.1 buPROPion (WELLBUTRIN XL) 300 MG 24 hr tablet    escitalopram (LEXAPRO) 10 MG tablet  3. Insomnia due to mental disorder  F51.05     Past Psychiatric History: I have reviewed past psychiatric history from my progress note on 02/20/2018.  Past trials of Prozac, Lexapro, Lamictal-noncompliant  Past Medical History:  Past Medical History:  Diagnosis Date  . Anxiety   . Asthma   . Depression   . GAD (generalized anxiety disorder) 04/10/2019  . Insomnia, psychophysiological 04/10/2019  . Presence of subdermal contraceptive implant 04/10/2019   Placed 02/08/2018; due out 02/08/2021 [facility] clinic    Past Surgical History:  Procedure Laterality Date  . NOSE SURGERY      Family Psychiatric History: I have reviewed family psychiatric history from my progress note on 02/20/2018  Family History:  Family History  Problem Relation Age of Onset  . Depression Mother   . Alcohol abuse Paternal Aunt     Social History: I have reviewed social history from my progress note on 02/20/2018  Social History   Socioeconomic History  . Marital status: Single    Spouse name: Not on file  . Number of children: 0  . Years of education: Not on file  . Highest education level: Bachelor's degree (e.g., BA, AB, BS)  Occupational History  . 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 Occupation: [employer] for [employer]    Comment: Chief Operating Officer  Tobacco Use   . Smoking status: Never Smoker  . Smokeless tobacco: Never Used  Vaping Use  . Vaping Use: Never used  Substance and Sexual Activity  . Alcohol use: No  . Drug use: No  . Sexual activity: Yes    Birth control/protection: Implant  Other Topics Concern  . Not on file  Social History Narrative   Mother is from [place];parents divorced when she was 28yo; estranged father. No siblings.    Social Determinants of Health   Financial Resource Strain:   . Difficulty of Paying Living Expenses: Not on file  Food Insecurity:   . Worried About Charity fundraiser in the Last Year: Not on file  . Ran Out of Food in the Last Year: Not on file  Transportation Needs:   . Lack of Transportation (Medical): Not on file  . Lack of Transportation (Non-Medical): Not on file  Physical Activity:   . Days of Exercise per Week: Not on file  . Minutes of Exercise per Session: Not on file  Stress:   . Feeling of Stress : Not on file  Social Connections:   . Frequency of Communication with Friends and Family: Not on file  . Frequency of Social Gatherings with Friends and Family: Not on file  . Attends Religious Services: Not on file  . Active Member of Clubs or Organizations: Not on file  . Attends Archivist Meetings: Not on file  . Marital Status: Not on file    Allergies: No Known Allergies  Metabolic Disorder Labs: No results found for: HGBA1C, MPG No results found for: PROLACTIN No results found for: CHOL, TRIG, HDL, CHOLHDL, VLDL, LDLCALC Lab Results  Component Value Date   TSH 1.48 04/10/2019    Therapeutic Level Labs: No results found for: LITHIUM No results found for: VALPROATE No components found for:  CBMZ  Current Medications: Current Outpatient Medications  Medication Sig Dispense Refill  . buPROPion (WELLBUTRIN XL) 300 MG 24 hr tablet Take 1 tablet (300 mg total) by mouth daily. 30 tablet 1  . dicyclomine (BENTYL) 10 MG capsule Take 1 capsule (10 mg total) by mouth 4  (four) times daily -  before meals and at bedtime. As needed for abdominal cramps 60 capsule 1  . escitalopram (LEXAPRO) 10 MG tablet Take 1 tablet (10 mg total) by mouth daily. 30 tablet 1  . etonogestrel (NEXPLANON) 68 MG IMPL implant Inject 1 each into the skin once.    . hydrOXYzine (ATARAX/VISTARIL) 25 MG tablet Take 0.5-1 tablets (12.5-25 mg total) by mouth 3 (three) times daily as needed for anxiety. 45 tablet 3  . lamotrigine (LAMICTAL ODT) 25 MG disintegrating tablet Take 1 tablet (25 mg total) by mouth daily for 15 days, THEN 1 tablet (25 mg total) 2 (two) times daily for 15 days. 45 tablet 0  . Melatonin 10 MG TABS Take 10 mg by mouth as needed.    Marland Kitchen omeprazole (PRILOSEC) 20 MG capsule Take 1 capsule (20 mg total) by mouth daily. 30 capsule 3   No current facility-administered medications for this  visit.     Musculoskeletal: Strength & Muscle Tone: UTA Gait & Station: normal Patient leans: N/A  Psychiatric Specialty Exam: Review of Systems  Psychiatric/Behavioral: Positive for sleep disturbance. The patient is nervous/anxious.        Mood swings  All other systems reviewed and are negative.   There were no vitals taken for this visit.There is no height or weight on file to calculate BMI.  General Appearance: Casual  Eye Contact:  Fair  Speech:  Clear and Coherent  Volume:  Normal  Mood:  Anxious mood swings  Affect:  Congruent  Thought Process:  Goal Directed and Descriptions of Associations: Intact  Orientation:  Full (Time, Place, and Person)  Thought Content: Logical   Suicidal Thoughts:  No  Homicidal Thoughts:  No  Memory:  Immediate;   Fair Recent;   Fair Remote;   Fair  Judgement:  Fair  Insight:  Fair  Psychomotor Activity:  Normal  Concentration:  Concentration: Fair and Attention Span: Fair  Recall:  [employer] of Knowledge: Fair  Language: Fair  Akathisia:  No  Handed:  Right  AIMS (if indicated):UTA  Assets:  Communication Skills Desire for  Improvement Housing Social Support  ADL's:  Intact  Cognition: WNL  Sleep:  Restless   Screenings: PHQ2-9     Office Visit from 04/10/2019 in [facility]  PHQ-2 Total Score 3  PHQ-9 Total Score 14       Assessment and Plan: Ronnetta Currington is a 24 year old Caucasian female, employed, single, lives in [place], was evaluated by telemedicine today.  Patient is currently struggling with anxiety, mood swings and will benefit from medication readjustment.  Plan as noted below.  Plan MDD-unstable Wellbutrin XL 300 mg p.o. daily Lexapro 10 mg p.o. daily Start Lamictal 25 mg p.o. daily for 2 weeks and increase to 25 mg p.o. twice daily after that.  GAD-unstable Lexapro 10 mg p.o. daily Start Lamictal as noted above. Continue CBT with [facility].  Insomnia-restless She will work on Nutritional therapist. Continue melatonin as needed. Hydroxyzine 12.5 to 25 mg p.o. 3 times daily as needed.  Follow-up in clinic in 3 to 4 weeks or sooner if needed.  I have spent atleast 20 minutes face to face by video with patient today. More than 50 % of the time was spent for preparing to see the patient ( e.g., review of test, records ), ordering medications and test ,psychoeducation and supportive psychotherapy and care coordination,as well as documenting clinical information in electronic health record. This note was generated in part or whole with voice recognition software. Voice recognition is usually quite accurate but there are transcription errors that can and very often do occur. I apologize for any typographical errors that were not detected and corrected.        Ursula Alert, MD 10/23/2019, 5:42 PM

## 2019-10-29 ENCOUNTER — Other Ambulatory Visit: Payer: Self-pay

## 2019-10-29 ENCOUNTER — Ambulatory Visit (INDEPENDENT_AMBULATORY_CARE_PROVIDER_SITE_OTHER): Admitting: Family Medicine

## 2019-10-29 ENCOUNTER — Encounter: Payer: Self-pay | Admitting: Family Medicine

## 2019-10-29 VITALS — BP 124/76 | HR 93 | Temp 98.1°F | Resp 16 | Ht 64.0 in | Wt 186.8 lb

## 2019-10-29 DIAGNOSIS — Z0000 Encounter for general adult medical examination without abnormal findings: Secondary | ICD-10-CM

## 2019-10-29 DIAGNOSIS — E049 Nontoxic goiter, unspecified: Secondary | ICD-10-CM

## 2019-10-29 DIAGNOSIS — R1084 Generalized abdominal pain: Secondary | ICD-10-CM

## 2019-10-29 DIAGNOSIS — F5105 Insomnia due to other mental disorder: Secondary | ICD-10-CM

## 2019-10-29 DIAGNOSIS — F411 Generalized anxiety disorder: Secondary | ICD-10-CM

## 2019-10-29 DIAGNOSIS — Z23 Encounter for immunization: Secondary | ICD-10-CM

## 2019-10-29 DIAGNOSIS — F331 Major depressive disorder, recurrent, moderate: Secondary | ICD-10-CM | POA: Diagnosis not present

## 2019-10-29 DIAGNOSIS — G8929 Other chronic pain: Secondary | ICD-10-CM

## 2019-10-29 NOTE — Patient Instructions (Signed)
Please return in 12 months for your annual complete physical; please come fasting.  Please call to get scheduled with [facility].  Work on daily mediation :)  I will release your lab results to you on your MyChart account with further instructions. Please reply with any questions.   We will call you to get you scheduled for a thyroid ultrasound to measure the size of the gland.   If you have any questions or concerns, please don't hesitate to send me a message via MyChart or call the office at [phone]. Thank you for visiting with [place] today! It's our pleasure caring for you.

## 2019-10-29 NOTE — Progress Notes (Signed)
Subjective  Chief Complaint  Patient presents with  . Annual Exam    non-fasting  . Abdominal Pain    2-3 times weekly, middle and lower abdomen, N/V    HPI: Haley Salazar is a 24 y.o. female who presents to [facility] today for a Female Wellness Visit.  She also has the concerns and/or needs as listed above in the chief complaint. These will be addressed in addition to the Health Maintenance Visit.   Wellness Visit: annual visit with health maintenance review and exam without Pap   HM: Up-to-date on cervical cancer screening.  Has IUD in place with intermittent light cycles.  Due Tdap and flu vaccine today. Chronic disease management visit and/or acute problem visit:  Mood disorder: Reviewed recent psychiatry notes.  Medication adjustment ongoing.  Feeling nauseous from Lamictal.  Suffering from significant anxiety symptoms.  Has gained significant amount of weight in the last 6 weeks.  In part due to medication effect and also due to stress eating.  Would like to see nutritionist.  Chronic abdominal pain: Still with nausea postprandial but less abdominal pain.  Has not yet made an appointment to see gastroenterology.  No new symptoms.   Assessment  1. Annual physical exam   2. GAD (generalized anxiety disorder)   3. MDD (major depressive disorder), recurrent episode, moderate ([facility])   4. Insomnia due to mental disorder   5. Chronic generalized abdominal pain   6. Enlarged thyroid      Plan  Female Wellness Visit:  Age appropriate Health Maintenance and Prevention measures were discussed with patient. Included topics are cancer screening recommendations, ways to keep healthy (see AVS) including dietary and exercise recommendations, regular eye and dental care, use of seat belts, and avoidance of moderate alcohol use and tobacco use.   BMI: discussed patient's BMI and encouraged positive lifestyle modifications to help get to or maintain a  target BMI.  HM needs and immunizations were addressed and ordered. See below for orders. See HM and immunization section for updates.  Flu and Tdap today  Routine labs and screening tests ordered including cmp, cbc and lipids where appropriate.  Discussed recommendations regarding Vit D and calcium supplementation (see AVS)  Chronic disease f/u and/or acute problem visit: (deemed necessary to be done in addition to the wellness visit):  Enlarged thyroid on exam: Symmetric and nontender.  No palpable nodules.  Check TSH and ultrasound  Mood disorder per psychiatry.  Counseling done.  She is seeing a therapist.  Abdominal pain: Mildly improved but recommend GI referral for thorough evaluation.  If work-up is negative, then can focus more on mental health issues.  Follow up: Return in about 1 year (around 10/28/2020) for complete physical.   Orders Placed This Encounter  Procedures  . US THYROID  . CBC with Differential/Platelet  . COMPLETE METABOLIC PANEL WITH GFR  . Lipid panel  . TSH   No orders of the defined types were placed in this encounter.     Lifestyle: Body mass index is 32.06 kg/m. Wt Readings from Last 3 Encounters:  10/29/19 186 lb 12.8 oz (84.7 kg)  05/22/19 163 lb 12.8 oz (74.3 kg)  04/10/19 165 lb 3.2 oz (74.9 kg)    Patient Active Problem List   Diagnosis Date Noted  . Chronic generalized abdominal pain 10/29/2019  . MDD (major depressive disorder), recurrent episode, moderate ([facility]) 06/04/2019  . 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 Bipolar disorder  with moderate depression ([facility]) 04/16/2019  . GAD (generalized anxiety disorder) 04/10/2019  . Presence of subdermal contraceptive implant 04/10/2019    Placed 02/08/2018; due out 02/08/2021 [facility] clinic   . Depressive disorder 01/01/2016    Psychiatric evaluation 2020; depressive d/o; r/o cluster B traits    Health Maintenance  Topic Date Due  . Hepatitis C Screening  Never done  .  TETANUS/TDAP  Never done  . INFLUENZA VACCINE  08/18/2019  . PAP-Cervical Cytology Screening  11/05/2020  . PAP SMEAR-Modifier  11/05/2020  . COVID-19 Vaccine  Completed  . HIV Screening  Completed   Immunization History  Administered Date(s) Administered  . Influenza Inj Mdck Quad Pf 11/08/2017, 12/20/2018  . PFIZER SARS-COV-2 Vaccination 04/11/2019, 05/06/2019   We updated and reviewed the patient's past history in detail and it is documented below. Allergies: Patient  reports no history of alcohol use. Past Medical History Patient  has a past medical history of Anxiety, Asthma, Depression, GAD (generalized anxiety disorder) (04/10/2019), Insomnia, psychophysiological (04/10/2019), and Presence of subdermal contraceptive implant (04/10/2019). Past Surgical History Patient  has a past surgical history that includes Nose surgery. Social History   Socioeconomic History  . Marital status: Single    Spouse name: Not on file  . Number of children: 0  . Years of education: Not on file  . Highest education level: Bachelor's degree (e.g., BA, AB, BS)  Occupational History  . 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 Occupation: [employer] for [employer]    Comment: Chief Operating Officer  Tobacco Use  . Smoking status: Never Smoker  . Smokeless tobacco: Never Used  Vaping Use  . Vaping Use: Never used  Substance and Sexual Activity  . Alcohol use: No  . Drug use: No  . Sexual activity: Yes    Birth control/protection: Implant  Other Topics Concern  . Not on file  Social History Narrative   Mother is from [place];parents divorced when she was 44yo; estranged father. No siblings.    Social Determinants of Health   Financial Resource Strain:   . Difficulty of Paying Living Expenses: Not on file  Food Insecurity:   . Worried About Charity fundraiser in the Last Year: Not on file  . Ran Out of Food in the Last Year: Not on file  Transportation Needs:   . Lack of  Transportation (Medical): Not on file  . Lack of Transportation (Non-Medical): Not on file  Physical Activity:   . Days of Exercise per Week: Not on file  . Minutes of Exercise per Session: Not on file  Stress:   . Feeling of Stress : Not on file  Social Connections:   . Frequency of Communication with Friends and Family: Not on file  . Frequency of Social Gatherings with Friends and Family: Not on file  . Attends Religious Services: Not on file  . Active Member of Clubs or Organizations: Not on file  . Attends Archivist Meetings: Not on file  . Marital Status: Not on file   Family History  Problem Relation Age of Onset  . Depression Mother   . Alcohol abuse Paternal Aunt     Review of Systems: Constitutional: negative for fever or malaise Ophthalmic: negative for photophobia, double vision or loss of vision Cardiovascular: negative for chest pain, dyspnea on exertion, or new LE swelling Respiratory: negative for SOB or persistent cough Gastrointestinal: negative forchange in bowel habits or melena Genitourinary: negative for dysuria or gross hematuria,  no abnormal uterine bleeding or disharge Musculoskeletal: negative for new gait disturbance or muscular weakness Integumentary: negative for new or persistent rashes, no breast lumps Neurological: negative for TIA or stroke symptoms Psychiatric: negative for SI or delusions Allergic/Immunologic: negative for hives  Patient Care Team    Relationship Specialty Notifications Start End  Leamon Arnt, MD PCP - General Family Medicine  04/10/19   Wayland Denis, PA-C  Physician Assistant  04/16/19     Objective  Vitals: BP 124/76   Pulse 93   Temp 98.1 F (36.7 C) (Temporal)   Resp 16   Ht 5\' 4"  (1.626 m)   Wt 186 lb 12.8 oz (84.7 kg)   SpO2 98%   BMI 32.06 kg/m  General:  Well developed, well nourished, no acute distress  Psych:  Alert and orientedx3,normal mood and affect, tearful at times HEENT:   Normocephalic, atraumatic, non-icteric sclera, PERRL, supple neck without adenopathy, mass + non tender symmetric thyromegaly Cardiovascular:  Normal S1, S2, RRR without gallop, rub or murmur Respiratory:  Good breath sounds bilaterally, CTAB with normal respiratory effort Gastrointestinal: normal bowel sounds, soft, minimally-tender, no noted masses. No HSM MSK: no deformities, contusions. Joints are without erythema or swelling.  Skin:  Warm, no rashes or suspicious lesions noted Neurologic:    Mental status is normal. Gross motor and sensory exams are normal. Normal gait. No tremor Breast Exam: No mass, skin retraction or nipple discharge is appreciated in either breast. No axillary adenopathy. Fibrocystic changes are not noted     Commons side effects, risks, benefits, and alternatives for medications and treatment plan prescribed today were discussed, and the patient expressed understanding of the given instructions. Patient is instructed to call or message via MyChart if he/she has any questions or concerns regarding our treatment plan. No barriers to understanding were identified. We discussed Red Flag symptoms and signs in detail. Patient expressed understanding regarding what to do in case of urgent or emergency type symptoms.   Medication list was reconciled, printed and provided to the patient in AVS. Patient instructions and summary information was reviewed with the patient as documented in the AVS. This note was prepared with assistance of Dragon voice recognition software. Occasional wrong-word or sound-a-like substitutions may have occurred due to the inherent limitations of voice recognition software  This visit occurred during the SARS-CoV-2 public health emergency.  Safety protocols were in place, including screening questions prior to the visit, additional usage of staff PPE, and extensive cleaning of exam room while observing appropriate contact time as indicated for disinfecting  solutions.

## 2019-10-29 NOTE — Addendum Note (Signed)
Addended by: Doran Clay A on: 10/29/2019 09:23 AM   Modules accepted: Orders

## 2019-10-30 LAB — COMPLETE METABOLIC PANEL WITH GFR
AG Ratio: 1.5 (calc) (ref 1.0–2.5)
ALT: 20 U/L (ref 6–29)
AST: 16 U/L (ref 10–30)
Albumin: 4.5 g/dL (ref 3.6–5.1)
Alkaline phosphatase (APISO): 66 U/L (ref 31–125)
BUN: 9 mg/dL (ref 7–25)
CO2: 25 mmol/L (ref 20–32)
Calcium: 9.6 mg/dL (ref 8.6–10.2)
Chloride: 107 mmol/L (ref 98–110)
Creat: 0.67 mg/dL (ref 0.50–1.10)
GFR, Est African American: 144 mL/min/{1.73_m2} (ref >=60)
GFR, Est Non African American: 124 mL/min/{1.73_m2} (ref >=60)
Globulin: 3.1 g/dL (calc) (ref 1.9–3.7)
Glucose, Bld: 89 mg/dL (ref 65–99)
Potassium: 4.4 mmol/L (ref 3.5–5.3)
Sodium: 139 mmol/L (ref 135–146)
Total Bilirubin: 0.3 mg/dL (ref 0.2–1.2)
Total Protein: 7.6 g/dL (ref 6.1–8.1)

## 2019-10-30 LAB — LIPID PANEL
Cholesterol: 228 mg/dL — ABNORMAL HIGH (ref <200)
HDL: 62 mg/dL (ref >=50)
LDL Cholesterol (Calc): 141 mg/dL (calc) — ABNORMAL HIGH
Non-HDL Cholesterol (Calc): 166 mg/dL (calc) — ABNORMAL HIGH (ref <130)
Total CHOL/HDL Ratio: 3.7 (calc) (ref <5.0)
Triglycerides: 124 mg/dL (ref <150)

## 2019-10-30 LAB — CBC WITH DIFFERENTIAL/PLATELET
Absolute Monocytes: 680 cells/uL (ref 200–950)
Basophils Absolute: 43 cells/uL (ref 0–200)
Basophils Relative: 0.5 %
Eosinophils Absolute: 111 cells/uL (ref 15–500)
Eosinophils Relative: 1.3 %
HCT: 39.1 % (ref 35.0–45.0)
Hemoglobin: 12.7 g/dL (ref 11.7–15.5)
Lymphs Abs: 2363 cells/uL (ref 850–3900)
MCH: 29.2 pg (ref 27.0–33.0)
MCHC: 32.5 g/dL (ref 32.0–36.0)
MCV: 89.9 fL (ref 80.0–100.0)
MPV: 10.2 fL (ref 7.5–12.5)
Monocytes Relative: 8 %
Neutro Abs: 5304 cells/uL (ref 1500–7800)
Neutrophils Relative %: 62.4 %
Platelets: 331 10*3/uL (ref 140–400)
RBC: 4.35 10*6/uL (ref 3.80–5.10)
RDW: 12.6 % (ref 11.0–15.0)
Total Lymphocyte: 27.8 %
WBC: 8.5 10*3/uL (ref 3.8–10.8)

## 2019-10-30 LAB — TSH: TSH: 2.27 mIU/L

## 2019-11-06 ENCOUNTER — Other Ambulatory Visit: Payer: Self-pay

## 2019-11-06 ENCOUNTER — Encounter: Attending: Family Medicine | Admitting: Skilled Nursing Facility1

## 2019-11-06 ENCOUNTER — Encounter: Payer: Self-pay | Admitting: Skilled Nursing Facility1

## 2019-11-06 DIAGNOSIS — E639 Nutritional deficiency, unspecified: Secondary | ICD-10-CM | POA: Diagnosis present

## 2019-11-06 NOTE — Progress Notes (Signed)
Assessment:  Primary concerns today: healthy meals.   DO NOT WEIGH Pt  Pt states she is taking a leave of absence from grad school due to changing mental health emd. Pt states she either eats too much or too little. Pt states she gained 20 pounds in the last month. Pt states she sleeps around 10 hours most nights mixture of depression and stress making her feel tired. 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 Pt states she was working 60 hours but is now working 40 hours.  Pt state lives alone. Pt states she has a hard time eating leftovers. Pt states she is too tired to cook every day. Pt states her mom called her fat throughout her childhood. saxenda pesto for 2 weeks. Pt states sometimes only certain types of food do not hurt her stomach. Pt states she will eat foods that cause nausea so then she eats one thing for 2 weeks.   Pt states she got Covid in July.  Pt states her stomach hurts all the time: feeling nausea all the time, bowel movements once a day, seemingly random boughts of diarrhea. Pt states she experiences bought's of bloat/gassy 1-2 times throughout the week.  Pt states 50% of her meals are eaten out.   Pt states she does not like: tomato, mushroom, tofu, yogurt. Pt states she only likes cream cheese on bagels.   MEDICATIONS: see list   DIETARY INTAKE:  Usual eating pattern includes 0-3 meals and 0 snacks per day.  Everyday foods include none stated  Avoided foods include none stated.    24-hr recall:  B ( AM): skipped Snk ( AM):  L ( PM): fast food Snk ( PM):  D ( PM): fast food  Snk ( PM):  Beverages: water, juice, gingerale ale  Usual physical activity: 3 times a week gym stair climber or stationary bike 15-20 minutes; and weights; walking 2-3 days a week  Progress Towards Goal(s):  In progress.    Intervention:  Nutrition counseling. Dietitian educated on balanced meals within the context of a healthy  relationship with food.  Encouraged patient to reject traditional diet mentality of "good" vs "bad" foods.  There are no good and bad foods, but rather food is fuel that we need for our bodies considering calorically dense verses nutrient dense foods.  When we don't get enough fuel, our bodies suffer the metabolic consequences.  Encouraged patient to eat whatever foods will satisfy them nutritionally. Avoid eating out of an emotional state; eating any food in this state regardless of its nutritional status would be inappropriate.  We will discuss nutritional values of foods at a subsequent appointment.  Encouraged patient to honor their body's internal hunger and fullness cues.  Throughout the day, check in mentally and rate hunger. Stop eating when satisfied not full regardless of how much food is left on the plate.  Get more if still hungry 20-30 minutes later.  The key is to honor satisfaction so throughout the meal, rate fullness factor and stop when comfortably satisfied not physically full. The key is to honor hunger and fullness without any feelings of guilt or shame.  Pay attention to what the internal cues are, rather than any external factors. This will enhance the confidence you have in listening to your own body and following those internal cues enabling you to increase how often you eat when you are hungry not out of appetite and stop when you  are satisfied not full.  Why should you not wait past 5 hours to eat?: Eating 3-5 hours after your meals will help control blood glucose (sugar) levels and helps control your hunger. Having too large of a gap between meals will cause you to be very hungry and eat too much; this can make you sick if you have had bariatric surgery and/or can cause larger fluctuations in your blood glucose levels if you have diabetes. Those who struggle with hypoglycemia (low blood glucose) will experience more hypoglycemic events if they do not have a consistent meal schedule.   Importance of vegetables To have an overall healthy diet, adult men and women are recommended to consume anywhere from 2-3 cups of vegetables daily. Vegetables provide a wide range of vitamins and minerals such as vitamin A, vitamin C, potassium, and folic acid. According to the [facility], including fruit and vegetables daily may reduce the risk of cardiovascular disease, certain cancers, and other non-communicable diseases.   Goals: You can try yoga for breathing  Aim for structure and consistency Do not skip meals  Teaching Method Utilized:  Visual Auditory Hands on  Handouts given during visit include:  Detailed MyPlate  Barriers to learning/adherence to lifestyle change: mental health  Demonstrated degree of understanding via:  Teach Back   Monitoring/Evaluation:  Dietary intake, exercise, and body weight prn.

## 2019-11-07 ENCOUNTER — Ambulatory Visit
Admission: RE | Admit: 2019-11-07 | Discharge: 2019-11-07 | Disposition: A | Discharge status: Home / Self Care | Source: Ambulatory Visit | Attending: Family Medicine | Admitting: Family Medicine

## 2019-11-07 DIAGNOSIS — E049 Nontoxic goiter, unspecified: Secondary | ICD-10-CM

## 2019-11-08 ENCOUNTER — Encounter: Payer: Self-pay | Admitting: Family Medicine

## 2019-11-08 ENCOUNTER — Encounter: Payer: Self-pay | Admitting: Physician Assistant

## 2019-11-08 DIAGNOSIS — Z0000 Encounter for general adult medical examination without abnormal findings: Secondary | ICD-10-CM | POA: Insufficient documentation

## 2019-11-20 ENCOUNTER — Telehealth (INDEPENDENT_AMBULATORY_CARE_PROVIDER_SITE_OTHER): Admitting: Psychiatry

## 2019-11-20 ENCOUNTER — Encounter: Payer: Self-pay | Admitting: Psychiatry

## 2019-11-20 ENCOUNTER — Other Ambulatory Visit: Payer: Self-pay

## 2019-11-20 VITALS — Ht 64.0 in | Wt 186.0 lb

## 2019-11-20 DIAGNOSIS — F5105 Insomnia due to other mental disorder: Secondary | ICD-10-CM | POA: Diagnosis not present

## 2019-11-20 DIAGNOSIS — F411 Generalized anxiety disorder: Secondary | ICD-10-CM | POA: Diagnosis not present

## 2019-11-20 DIAGNOSIS — F331 Major depressive disorder, recurrent, moderate: Secondary | ICD-10-CM

## 2019-11-20 MED ORDER — LAMOTRIGINE 100 MG PO TABS: 50.0000 mg | ORAL_TABLET | Freq: Two times a day (BID) | ORAL | 0 refills | Status: DC

## 2019-11-20 MED ORDER — BUPROPION HCL ER (XL) 300 MG PO TB24: 300.0000 mg | ORAL_TABLET | Freq: Every day | ORAL | 0 refills | Status: DC

## 2019-11-20 MED ORDER — ESCITALOPRAM OXALATE 10 MG PO TABS: 10.0000 mg | ORAL_TABLET | Freq: Every day | ORAL | 0 refills | Status: DC

## 2019-11-20 NOTE — Progress Notes (Signed)
Virtual Visit via Video Note  I connected with Haley Salazar on 11/20/19 at 10:40 AM EDT by a video enabled telemedicine application and verified that I am speaking with the correct person using two identifiers.  Location Provider Location : [facility] Patient Location : Car  Participants: Patient , Provider   I discussed the limitations of evaluation and management by telemedicine and the availability of in person appointments. The patient expressed understanding and agreed to proceed.     I discussed the assessment and treatment plan with the patient. The patient was provided an opportunity to ask questions and all were answered. The patient agreed with the plan and demonstrated an understanding of the instructions.   The patient was advised to call back or seek an in-person evaluation if the symptoms worsen or if the condition fails to improve as anticipated.   [place] MD OP Progress Note  11/21/2019 8:27 AM Deven Audi  MRN:  [id number]  Chief Complaint:  Chief Complaint    Follow-up     HPI: Haley Salazar is a 24 year old Caucasian female, single, lives in [place], employed, has a history of MDD, GAD, sleep problems, was evaluated by telemedicine today.  Patient today reports she continues to struggle with job related problems.  She is currently looking for a new job.  She reports she took the semester off from school.  She took a medical leave of absence.  She is planning to go back to school next semester probably in January.  She reports overall her crying has improved.  She however continues to struggle with mood swings.  She reports Lamictal may be helpful to some extent.  Patient denies any suicidality, homicidality or perceptual disturbances.  Patient reports she met with a nutritionist and is currently working on her diet and has started exercising.  She however has not lost any weight and may have gained 5 pounds since she had taken a trip to be with family  recently.  Patient denies any other concerns today.  Visit Diagnosis:    ICD-10-CM   1. MDD (major depressive disorder), recurrent episode, moderate (HCC)  F33.1 lamoTRIgine (LAMICTAL) 100 MG tablet    escitalopram (LEXAPRO) 10 MG tablet  2. GAD (generalized anxiety disorder)  F41.1 escitalopram (LEXAPRO) 10 MG tablet    buPROPion (WELLBUTRIN XL) 300 MG 24 hr tablet  3. Insomnia due to mental disorder  F51.05     Past Psychiatric History: I have reviewed past psychiatric history from my progress note on 02/20/2018.  Past trials of Prozac, Lexapro, Lamictal-noncompliant  Past Medical History:  Past Medical History:  Diagnosis Date  . Anxiety   . Asthma   . Depression   . GAD (generalized anxiety disorder) 04/10/2019  . Insomnia, psychophysiological 04/10/2019  . Presence of subdermal contraceptive implant 04/10/2019   Placed 02/08/2018; due out 02/08/2021 [facility] clinic    Past Surgical History:  Procedure Laterality Date  . NOSE SURGERY      Family Psychiatric History: I have reviewed family psychiatric history from my progress note on 02/20/2018  Family History:  Family History  Problem Relation Age of Onset  . Depression Mother   . Alcohol abuse Paternal Aunt     Social History: I have reviewed social history from my progress note on 02/20/2018 Social History   Socioeconomic History  . Marital status: Single    Spouse name: Not on file  . Number of children: 0  . Years of education: Not on file  . Highest  education level: Bachelor's degree (e.g., BA, AB, BS)  Occupational History  . 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 Occupation: [employer] for [employer]    Comment: Chief Operating Officer  Tobacco Use  . Smoking status: Never Smoker  . Smokeless tobacco: Never Used  Vaping Use  . Vaping Use: Never used  Substance and Sexual Activity  . Alcohol use: No  . Drug use: No  . Sexual activity: Yes    Birth control/protection: Implant  Other  Topics Concern  . Not on file  Social History Narrative   Mother is from [place];parents divorced when she was 60yo; estranged father. No siblings.    Social Determinants of Health   Financial Resource Strain:   . Difficulty of Paying Living Expenses: Not on file  Food Insecurity:   . Worried About Charity fundraiser in the Last Year: Not on file  . Ran Out of Food in the Last Year: Not on file  Transportation Needs:   . Lack of Transportation (Medical): Not on file  . Lack of Transportation (Non-Medical): Not on file  Physical Activity:   . Days of Exercise per Week: Not on file  . Minutes of Exercise per Session: Not on file  Stress:   . Feeling of Stress : Not on file  Social Connections:   . Frequency of Communication with Friends and Family: Not on file  . Frequency of Social Gatherings with Friends and Family: Not on file  . Attends Religious Services: Not on file  . Active Member of Clubs or Organizations: Not on file  . Attends Archivist Meetings: Not on file  . Marital Status: Not on file    Allergies: No Known Allergies  Metabolic Disorder Labs: No results found for: HGBA1C, MPG No results found for: PROLACTIN Lab Results  Component Value Date   CHOL 228 (H) 10/29/2019   TRIG 124 10/29/2019   HDL 62 10/29/2019   CHOLHDL 3.7 10/29/2019   LDLCALC 141 (H) 10/29/2019   Lab Results  Component Value Date   TSH 2.27 10/29/2019   TSH 1.48 04/10/2019    Therapeutic Level Labs: No results found for: LITHIUM No results found for: VALPROATE No components found for:  CBMZ  Current Medications: Current Outpatient Medications  Medication Sig Dispense Refill  . buPROPion (WELLBUTRIN XL) 300 MG 24 hr tablet Take 1 tablet (300 mg total) by mouth daily. 90 tablet 0  . dicyclomine (BENTYL) 10 MG capsule Take 1 capsule (10 mg total) by mouth 4 (four) times daily -  before meals and at bedtime. As needed for abdominal cramps (Patient not taking: Reported on  10/29/2019) 60 capsule 1  . escitalopram (LEXAPRO) 10 MG tablet Take 1 tablet (10 mg total) by mouth daily. 90 tablet 0  . etonogestrel (NEXPLANON) 68 MG IMPL implant Inject 1 each into the skin once.    . hydrOXYzine (ATARAX/VISTARIL) 25 MG tablet Take 0.5-1 tablets (12.5-25 mg total) by mouth 3 (three) times daily as needed for anxiety. 45 tablet 3  . ibuprofen (ADVIL) 800 MG tablet Take 800 mg by mouth every 6 (six) hours as needed.    . lamoTRIgine (LAMICTAL) 100 MG tablet Take 0.5 tablets (50 mg total) by mouth 2 (two) times daily. 90 tablet 0  . Melatonin 10 MG TABS Take 10 mg by mouth as needed. (Patient not taking: Reported on 10/29/2019)    . omeprazole (PRILOSEC) 20 MG capsule Take 1 capsule (20 mg total) by mouth daily. (  Patient not taking: Reported on 10/29/2019) 30 capsule 3   No current facility-administered medications for this visit.     Musculoskeletal: Strength & Muscle Tone: UTA Gait & Station: normal Patient leans: N/A  Psychiatric Specialty Exam: Review of Systems  Psychiatric/Behavioral: Positive for dysphoric mood. The patient is nervous/anxious.   All other systems reviewed and are negative.   Height _0  (1.626 m), weight 186 lb (84.4 kg).Body mass index is 31.93 kg/m.  General Appearance: Casual  Eye Contact:  Fair  Speech:  Clear and Coherent  Volume:  Normal  Mood:  Anxious and Depressed  Affect:  Congruent  Thought Process:  Goal Directed and Descriptions of Associations: Intact  Orientation:  Full (Time, Place, and Person)  Thought Content: Logical   Suicidal Thoughts:  No  Homicidal Thoughts:  No  Memory:  Immediate;   Fair Recent;   Fair Remote;   Fair  Judgement:  Fair  Insight:  Fair  Psychomotor Activity:  Normal  Concentration:  Concentration: Fair and Attention Span: Fair  Recall:  [employer] of Knowledge: Fair  Language: Fair  Akathisia:  No  Handed:  Right  AIMS (if indicated): UTA  Assets:  Communication Skills Desire for  Improvement Housing Social Support  ADL's:  Intact  Cognition: WNL  Sleep:  Fair   Screenings: GAD-7     Office Visit from 10/29/2019 in [facility]  Total GAD-7 Score 11    PHQ2-9     N[facility] Visit from 10/29/2019 in [facility] Visit from 04/10/2019 in [facility]  PHQ-2 Total Score 0 6 3  PHQ-9 Total Score -- 19 14       Assessment and Plan: Esta Carmon is a 24 year old Caucasian female, employed, single, lives in [place] was evaluated by telemedicine today.  Patient is currently struggling with mood swings although moving.  Plan as noted below.  Plan MDD-improving Wellbutrin XL 300 mg p.o. daily Lexapro 10 mg p.o. daily Increase Lamictal to 50 mg p.o. twice daily  GAD-improving Lexapro as prescribed Continue CBT with [facility].  Insomnia-improving Melatonin as needed Continue sleep hygiene techniques Hydroxyzine 12.5 to 25 mg p.o. 3 times daily as needed  Follow-up in clinic in 3 to 4 weeks or sooner if needed.  I have spent atleast 20 minutes face to face by video with patient today. More than 50 % of the time was spent for preparing to see the patient ( e.g., review of test, records ), ordering medications and test ,psychoeducation and supportive psychotherapy and care coordination,as well as documenting clinical information in electronic health record. This note was generated in part or whole with voice recognition software. Voice recognition is usually quite accurate but there are transcription errors that can and very often do occur. I apologize for any typographical errors that were not detected and corrected.       Ursula Alert, MD 11/21/2019, 8:27 AM

## 2019-11-21 ENCOUNTER — Encounter: Payer: Self-pay | Admitting: Psychiatry

## 2019-11-26 ENCOUNTER — Ambulatory Visit: Admitting: Physician Assistant

## 2019-12-18 ENCOUNTER — Ambulatory Visit: Admitting: Physician Assistant

## 2019-12-25 ENCOUNTER — Telehealth: Admitting: Psychiatry

## 2020-01-10 ENCOUNTER — Telehealth: Admitting: Psychiatry

## 2020-02-24 ENCOUNTER — Encounter: Payer: Self-pay | Admitting: Family Medicine

## 2020-03-09 ENCOUNTER — Telehealth: Payer: Self-pay

## 2020-03-09 DIAGNOSIS — F331 Major depressive disorder, recurrent, moderate: Secondary | ICD-10-CM

## 2020-03-09 DIAGNOSIS — F411 Generalized anxiety disorder: Secondary | ICD-10-CM

## 2020-03-09 MED ORDER — LAMOTRIGINE 100 MG PO TABS: 50.0000 mg | ORAL_TABLET | Freq: Two times a day (BID) | ORAL | 0 refills | Status: DC

## 2020-03-09 MED ORDER — ESCITALOPRAM OXALATE 10 MG PO TABS: 10.0000 mg | ORAL_TABLET | Freq: Every day | ORAL | 0 refills | Status: DC

## 2020-03-09 MED ORDER — BUPROPION HCL ER (XL) 300 MG PO TB24: 300.0000 mg | ORAL_TABLET | Freq: Every day | ORAL | 0 refills | Status: DC

## 2020-03-09 MED ORDER — HYDROXYZINE HCL 25 MG PO TABS: 12.5000 mg | ORAL_TABLET | Freq: Three times a day (TID) | ORAL | 3 refills | Status: DC | PRN

## 2020-03-09 NOTE — Telephone Encounter (Signed)
pt called left message that she needs a refill on all of her medicaitons.

## 2020-03-09 NOTE — Telephone Encounter (Signed)
I have sent all [facility].

## 2020-03-17 ENCOUNTER — Other Ambulatory Visit: Payer: Self-pay

## 2020-03-17 ENCOUNTER — Telehealth (INDEPENDENT_AMBULATORY_CARE_PROVIDER_SITE_OTHER): Admitting: Psychiatry

## 2020-03-17 DIAGNOSIS — Z5329 Procedure and treatment not carried out because of patient's decision for other reasons: Secondary | ICD-10-CM

## 2020-03-17 NOTE — Progress Notes (Signed)
No response to call or text or video invite  

## 2020-06-22 ENCOUNTER — Ambulatory Visit (INDEPENDENT_AMBULATORY_CARE_PROVIDER_SITE_OTHER): Admitting: Family Medicine

## 2020-06-22 ENCOUNTER — Other Ambulatory Visit: Payer: Self-pay

## 2020-06-22 ENCOUNTER — Encounter: Payer: Self-pay | Admitting: Family Medicine

## 2020-06-22 VITALS — BP 120/86 | HR 94 | Temp 98.6°F | Ht 64.0 in | Wt 197.2 lb

## 2020-06-22 DIAGNOSIS — L709 Acne, unspecified: Secondary | ICD-10-CM

## 2020-06-22 DIAGNOSIS — R0683 Snoring: Secondary | ICD-10-CM

## 2020-06-22 DIAGNOSIS — F331 Major depressive disorder, recurrent, moderate: Secondary | ICD-10-CM

## 2020-06-22 DIAGNOSIS — L680 Hirsutism: Secondary | ICD-10-CM | POA: Diagnosis not present

## 2020-06-22 DIAGNOSIS — E669 Obesity, unspecified: Secondary | ICD-10-CM | POA: Insufficient documentation

## 2020-06-22 DIAGNOSIS — R635 Abnormal weight gain: Secondary | ICD-10-CM | POA: Diagnosis not present

## 2020-06-22 DIAGNOSIS — Z975 Presence of (intrauterine) contraceptive device: Secondary | ICD-10-CM

## 2020-06-22 DIAGNOSIS — N83209 Unspecified ovarian cyst, unspecified side: Secondary | ICD-10-CM

## 2020-06-22 LAB — CBC WITH DIFFERENTIAL/PLATELET
Basophils Absolute: 0.1 10*3/uL (ref 0.0–0.1)
Basophils Relative: 0.5 % (ref 0.0–3.0)
Eosinophils Absolute: 0.1 10*3/uL (ref 0.0–0.7)
Eosinophils Relative: 0.9 % (ref 0.0–5.0)
HCT: 38.1 % (ref 36.0–46.0)
Hemoglobin: 12.8 g/dL (ref 12.0–15.0)
Lymphocytes Relative: 29 % (ref 12.0–46.0)
Lymphs Abs: 2.7 10*3/uL (ref 0.7–4.0)
MCHC: 33.7 g/dL (ref 30.0–36.0)
MCV: 86.3 fl (ref 78.0–100.0)
Monocytes Absolute: 0.6 10*3/uL (ref 0.1–1.0)
Monocytes Relative: 5.9 % (ref 3.0–12.0)
Neutro Abs: 6 10*3/uL (ref 1.4–7.7)
Neutrophils Relative %: 63.7 % (ref 43.0–77.0)
Platelets: 301 10*3/uL (ref 150.0–400.0)
RBC: 4.41 Mil/uL (ref 3.87–5.11)
RDW: 13.9 % (ref 11.5–15.5)
WBC: 9.4 10*3/uL (ref 4.0–10.5)

## 2020-06-22 LAB — HEMOGLOBIN A1C: Hgb A1c MFr Bld: 5.7 % (ref 4.6–6.5)

## 2020-06-22 LAB — TSH: TSH: 1.57 u[IU]/mL (ref 0.35–4.50)

## 2020-06-22 NOTE — Progress Notes (Signed)
Subjective  CC:  Chief Complaint  Patient presents with  . Weight Gain    Patient has been watching her eating, dieting, and doing workouts but not losing any weight  . Snoring    Says her boyfriend tells her she has started snoring loudly, gasps for air in the middle of the night while sleeping    HPI: Haley Salazar is a 25 y.o. female who presents to the office today to address the problems listed above in the chief complaint.  Weight gain.  Approximately 20 to 30 pound weight gain over the last year or so.  10 pounds since October of last year.  In January she stopped Lexapro due to concerns of it contributing to weight gain.  She is also been exercising and eating better.  She has been to nutritionist.  She tends to eat a carb heavy diet with pasta and rice for dinner.  She eats salads and [place] bars for lunch and dinner.  She has been exercising regularly for the last 4 to 5 months.  She feels that she continues to struggle to lose the weight.  She does not drink alcohol much.  She does not drink sodas.  Rare snacks or sweets.  She is currently on nexplanon.  She uses this for birth control but she did admit to irregular menstrual cycles with increased acne and facial and abdominal hair in the past.  She is of Hispanic descent so this could be normal for her.    Snoring: Not bothersome but witnessed.  Possible apneic at night.  Review of systems is negative for symptoms of hyperglycemia, chest pain, palpitations, decreased energy.  She reports her mood is currently controlled on Lamictal and Wellbutrin.  She sees psychiatry.  I have reviewed notes.  Wt Readings from Last 3 Encounters:  06/22/20 197 lb 3.2 oz (89.4 kg)  11/20/19 186 lb (84.4 kg)  10/29/19 186 lb 12.8 oz (84.7 kg)    Assessment  1. Weight gain   2. MDD (major depressive disorder), recurrent episode, moderate ([place])   3. Presence of subdermal contraceptive implant   4. Hirsutism   5. Acne, unspecified acne  type   6. Snoring   7. Obesity (BMI 30-39.9)      Plan   Weight gain in spite of improved diet and exercise: Discussed possible reasons: Discussed changing diet to low-carb Mediterranean.  Continue exercise.  Check lab work and rule out hypothyroidism, diabetes.  PCOS is also in the differential.  Check pelvic ultrasound and LH FSH levels although these may be slightly inaccurate because she is on birth control.  She will change her diet and work further on weight loss.  Follow-up with me if we were to consider weight loss medications.  Continue her current mood medications.  I do feel Lexapro could have contributed to her weight gain.  Her birth control could also be contributing to her weight gain.  Work on weight loss and then reevaluate her stooling.  Follow up: For complete physical 10/29/2020  Orders Placed This Encounter  Procedures  . US Transvaginal Non-OB  . CBC with Differential/Platelet  . Comprehensive metabolic panel  . Lipid panel  . TSH  . FSH/LH  . Hemoglobin A1c   No orders of the defined types were placed in this encounter.     I reviewed the patients updated PMH, FH, and SocHx.    Patient Active Problem List   Diagnosis Date Noted  . Obesity (BMI 30-39.9) 06/22/2020  .  Normal thyroid exam 11/08/2019  . Chronic generalized abdominal pain 10/29/2019  . MDD (major depressive disorder), recurrent episode, moderate ([place]) 06/04/2019  . 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 Bipolar disorder with moderate depression ([place]) 04/16/2019  . GAD (generalized anxiety disorder) 04/10/2019  . Presence of subdermal contraceptive implant 04/10/2019  . Depressive disorder 01/01/2016   Current Meds  Medication Sig  . buPROPion (WELLBUTRIN XL) 300 MG 24 hr tablet Take 1 tablet (300 mg total) by mouth daily.  Marland Kitchen etonogestrel (NEXPLANON) 68 MG IMPL implant Inject 1 each into the skin once.  . hydrOXYzine (ATARAX/VISTARIL) 25 MG tablet Take 0.5-1 tablets (12.5-25 mg  total) by mouth 3 (three) times daily as needed for anxiety.  . lamoTRIgine (LAMICTAL) 100 MG tablet Take 0.5 tablets (50 mg total) by mouth 2 (two) times daily.    Allergies: Patient has No Known Allergies. Family History: Patient family history includes Alcohol abuse in her paternal aunt; Depression in her mother. Social History:  Patient  reports that she has never smoked. She has never used smokeless tobacco. She reports that she does not drink alcohol and does not use drugs.  Review of Systems: Constitutional: Negative for fever malaise or anorexia Cardiovascular: negative for chest pain Respiratory: negative for SOB or persistent cough Gastrointestinal: negative for abdominal pain  Objective  Vitals: BP 120/86   Pulse 94   Temp 98.6 F (37 C) (Temporal)   Ht 5\' 4"  (1.626 m)   Wt 197 lb 3.2 oz (89.4 kg)   LMP 06/12/2020 Comment: Still on cycle  SpO2 98%   BMI 33.85 kg/m  General: no acute distress , A&Ox3 HEENT: PEERL, conjunctiva normal, neck is supple Cardiovascular:  RRR without murmur or gallop.  Respiratory:  Good breath sounds bilaterally, CTAB with normal respiratory effort Skin:  Warm, no rashes, acne on chin and upper back, mild hirsutism on abdomen, no striae     Commons side effects, risks, benefits, and alternatives for medications and treatment plan prescribed today were discussed, and the patient expressed understanding of the given instructions. Patient is instructed to call or message via MyChart if he/she has any questions or concerns regarding our treatment plan. No barriers to understanding were identified. We discussed Red Flag symptoms and signs in detail. Patient expressed understanding regarding what to do in case of urgent or emergency type symptoms.   Medication list was reconciled, printed and provided to the patient in AVS. Patient instructions and summary information was reviewed with the patient as documented in the AVS. This note was prepared  with assistance of Dragon voice recognition software. Occasional wrong-word or sound-a-like substitutions may have occurred due to the inherent limitations of voice recognition software  This visit occurred during the SARS-CoV-2 public health emergency.  Safety protocols were in place, including screening questions prior to the visit, additional usage of staff PPE, and extensive cleaning of exam room while observing appropriate contact time as indicated for disinfecting solutions.

## 2020-06-22 NOTE — Patient Instructions (Addendum)
Please return in October for your annual complete physical; please come fasting. Sooner if you would like to discuss weight loss medications.   I will release your lab results to you on your MyChart account with further instructions. Please reply with any questions.  We will call you to get your pelvic ultrasound set up to evaluate your ovaries.   If you have any questions or concerns, please don't hesitate to send me a message via MyChart or call the office at [phone]. Thank you for visiting with [place] today! It's our pleasure caring for you.   Mediterranean Diet A Mediterranean diet refers to food and lifestyle choices that are based on the traditions of countries located on the [employer]. This way of eating has been shown to help prevent certain conditions and improve outcomes for people who have chronic diseases, like kidney disease and heart disease. What are tips for following this plan? Lifestyle  Cook and eat meals together with your family, when possible.  Drink enough fluid to keep your urine clear or pale yellow.  Be physically active every day. This includes: ? Aerobic exercise like running or swimming. ? Leisure activities like gardening, walking, or housework.  Get 7-8 hours of sleep each night.  If recommended by your health care provider, drink red wine in moderation. This means 1 glass a day for nonpregnant women and 2 glasses a day for men. A glass of wine equals 5 oz (150 mL). Reading food labels  Check the serving size of packaged foods. For foods such as rice and pasta, the serving size refers to the amount of cooked product, not dry.  Check the total fat in packaged foods. Avoid foods that have saturated fat or trans fats.  Check the ingredients list for added sugars, such as corn syrup.   Shopping  At the grocery store, buy most of your food from the areas near the walls of the store. This includes: ? Fresh fruits and vegetables (produce). ? Grains,  beans, nuts, and seeds. Some of these may be available in unpackaged forms or large amounts (in bulk). ? Fresh seafood. ? Poultry and eggs. ? Low-fat dairy products.  Buy whole ingredients instead of prepackaged foods.  Buy fresh fruits and vegetables in-season from local farmers markets.  Buy frozen fruits and vegetables in resealable bags.  If you do not have access to quality fresh seafood, buy precooked frozen shrimp or canned fish, such as tuna, salmon, or sardines.  Buy small amounts of raw or cooked vegetables, salads, or olives from the deli or salad bar at your store.  Stock your pantry so you always have certain foods on hand, such as olive oil, canned tuna, canned tomatoes, rice, pasta, and beans. Cooking  Cook foods with extra-virgin olive oil instead of using butter or other vegetable oils.  Have meat as a side dish, and have vegetables or grains as your main dish. This means having meat in small portions or adding small amounts of meat to foods like pasta or stew.  Use beans or vegetables instead of meat in common dishes like chili or lasagna.  Experiment with different cooking methods. Try roasting or broiling vegetables instead of steaming or sauteing them.  Add frozen vegetables to soups, stews, pasta, or rice.  Add nuts or seeds for added healthy fat at each meal. You can add these to yogurt, salads, or vegetable dishes.  Marinate fish or vegetables using olive oil, lemon juice, garlic, and fresh herbs. Meal planning  Plan to eat 1 vegetarian meal one day each week. Try to work up to 2 vegetarian meals, if possible.  Eat seafood 2 or more times a week.  Have healthy snacks readily available, such as: ? Vegetable sticks with hummus. ? [place] yogurt. ? Fruit and nut trail mix.  Eat balanced meals throughout the week. This includes: ? Fruit: 2-3 servings a day ? Vegetables: 4-5 servings a day ? Low-fat dairy: 2 servings a day ? Fish, poultry, or lean meat:  1 serving a day ? Beans and legumes: 2 or more servings a week ? Nuts and seeds: 1-2 servings a day ? Whole grains: 6-8 servings a day ? Extra-virgin olive oil: 3-4 servings a day  Limit red meat and sweets to only a few servings a month   What are my food choices?  Mediterranean diet ? Recommended  Grains: Whole-grain pasta. Brown rice. Bulgar wheat. Polenta. Couscous. Whole-wheat bread. [place].  Vegetables: Artichokes. Beets. Broccoli. Cabbage. Carrots. Eggplant. Green beans. Chard. Kale. Spinach. Onions. Leeks. Peas. Squash. Tomatoes. Peppers. Radishes.  Fruits: Apples. Apricots. Avocado. Berries. Bananas. Cherries. Dates. Figs. Grapes. Lemons. Melon. Oranges. Peaches. Plums. Pomegranate.  Meats and other protein foods: Beans. Almonds. Sunflower seeds. Pine nuts. Peanuts. [place]. Salmon. Scallops. Shrimp. [place]. Tilapia. Clams. Oysters. Eggs.  Dairy: Low-fat milk. Cheese. Greek yogurt.  Beverages: Water. Red wine. Herbal tea.  Fats and oils: Extra virgin olive oil. Avocado oil. Grape seed oil.  Sweets and desserts: [place] yogurt with honey. Baked apples. Poached pears. Trail mix.  Seasoning and other foods: Basil. Cilantro. Coriander. Cumin. Mint. Parsley. Sage. Rosemary. Tarragon. Garlic. Oregano. Thyme. Pepper. Balsalmic vinegar. Tahini. Hummus. Tomato sauce. Olives. Mushrooms. ? Limit these  Grains: Prepackaged pasta or rice dishes. Prepackaged cereal with added sugar.  Vegetables: Deep fried potatoes (french fries).  Fruits: Fruit canned in syrup.  Meats and other protein foods: Beef. Pork. Lamb. Poultry with skin. Hot dogs. [place] Salines.  Dairy: Ice cream. Sour cream. Whole milk.  Beverages: Juice. Sugar-sweetened soft drinks. Beer. Liquor and spirits.  Fats and oils: Butter. Canola oil. Vegetable oil. Beef fat (tallow). Lard.  Sweets and desserts: Cookies. Cakes. Pies. Candy.  Seasoning and other foods: Mayonnaise. Premade sauces and marinades. The items listed  may not be a complete list. Talk with your dietitian about what dietary choices are right for you. Summary  The Mediterranean diet includes both food and lifestyle choices.  Eat a variety of fresh fruits and vegetables, beans, nuts, seeds, and whole grains.  Limit the amount of red meat and sweets that you eat.  Talk with your health care provider about whether it is safe for you to drink red wine in moderation. This means 1 glass a day for nonpregnant women and 2 glasses a day for men. A glass of wine equals 5 oz (150 mL). This information is not intended to replace advice given to you by your health care provider. Make sure you discuss any questions you have with your health care provider. Document Revised: 09/03/2015 Document Reviewed: 08/27/2015 Elsevier Patient Education  [place].

## 2020-06-23 LAB — COMPREHENSIVE METABOLIC PANEL
ALT: 16 U/L (ref 0–35)
AST: 13 U/L (ref 0–37)
Albumin: 4.7 g/dL (ref 3.5–5.2)
Alkaline Phosphatase: 69 U/L (ref 39–117)
BUN: 9 mg/dL (ref 6–23)
CO2: 24 mEq/L (ref 19–32)
Calcium: 9.9 mg/dL (ref 8.4–10.5)
Chloride: 106 mEq/L (ref 96–112)
Creatinine, Ser: 0.78 mg/dL (ref 0.40–1.20)
GFR: 106.52 mL/min (ref >60.00)
Glucose, Bld: 96 mg/dL (ref 70–99)
Potassium: 4 mEq/L (ref 3.5–5.1)
Sodium: 140 mEq/L (ref 135–145)
Total Bilirubin: 0.3 mg/dL (ref 0.2–1.2)
Total Protein: 7.7 g/dL (ref 6.0–8.3)

## 2020-06-23 LAB — LIPID PANEL
Cholesterol: 192 mg/dL (ref 0–200)
HDL: 48.3 mg/dL (ref >39.00)
NonHDL: 143.84
Total CHOL/HDL Ratio: 4
Triglycerides: 201 mg/dL — ABNORMAL HIGH (ref 0.0–149.0)
VLDL: 40.2 mg/dL — ABNORMAL HIGH (ref 0.0–40.0)

## 2020-06-23 LAB — FSH/LH
FSH: 6.1 m[IU]/mL
LH: 7.6 m[IU]/mL

## 2020-06-23 LAB — LDL CHOLESTEROL, DIRECT: Direct LDL: 122 mg/dL

## 2020-07-12 ENCOUNTER — Encounter: Payer: Self-pay | Admitting: Registered Nurse

## 2020-07-12 NOTE — Progress Notes (Signed)
Patient contacted via telephone s/p close covid contact 6/14, home test negative 6/19 and continues asymptomatic. Day 10 6/24 Strict mask wear has been completed covid vaccination x 3. Patient cleared to return onsite and may eat in employee lunchroom again at [employer]. HR notified. Patient A&Ox3 spoke full sentences without difficulty no cough/nasal congestion or throat clearing noted during 2 minute telephone call. Patient verbalized understanding information/instructions and had no further questions or concerns at this time.

## 2020-07-13 ENCOUNTER — Ambulatory Visit
Admission: RE | Admit: 2020-07-13 | Discharge: 2020-07-13 | Disposition: A | Discharge status: Home / Self Care | Source: Ambulatory Visit | Attending: Family Medicine | Admitting: Family Medicine

## 2020-07-13 DIAGNOSIS — R635 Abnormal weight gain: Secondary | ICD-10-CM

## 2020-07-13 DIAGNOSIS — L709 Acne, unspecified: Secondary | ICD-10-CM

## 2020-07-13 DIAGNOSIS — L680 Hirsutism: Secondary | ICD-10-CM

## 2020-07-14 ENCOUNTER — Encounter: Payer: Self-pay | Admitting: Family Medicine

## 2020-07-14 NOTE — Addendum Note (Signed)
Addended by: Billey Chang on: 07/14/2020 10:45 AM   Modules accepted: Orders

## 2020-07-24 ENCOUNTER — Other Ambulatory Visit: Payer: Self-pay | Admitting: Family Medicine

## 2020-07-24 NOTE — Addendum Note (Signed)
Addended by: Marian Sorrow on: 07/24/2020 08:40 AM   Modules accepted: Orders

## 2020-07-26 ENCOUNTER — Telehealth: Payer: Self-pay | Admitting: Registered Nurse

## 2020-07-26 ENCOUNTER — Encounter: Payer: Self-pay | Admitting: Registered Nurse

## 2020-07-26 ENCOUNTER — Other Ambulatory Visit: Payer: Self-pay | Admitting: Registered Nurse

## 2020-07-26 DIAGNOSIS — U071 COVID-19: Secondary | ICD-10-CM

## 2020-07-26 MED ORDER — SALINE SPRAY 0.65 % NA SOLN: 2.0000 | NASAL | 0 refills | Status: DC

## 2020-07-26 MED ORDER — FLUTICASONE PROPIONATE 50 MCG/ACT NA SUSP
1.0000 | Freq: Two times a day (BID) | NASAL | 0 refills | Status: DC
Start: 2020-07-26 — End: 2021-03-22

## 2020-07-26 MED ORDER — BENZONATATE 200 MG PO CAPS
200.0000 mg | ORAL_CAPSULE | Freq: Two times a day (BID) | ORAL | 0 refills | Status: DC | PRN
Start: 2020-07-26 — End: 2021-03-22

## 2020-07-26 MED ORDER — PHENYLEPHRINE HCL 5 MG PO TABS
5.0000 mg | ORAL_TABLET | Freq: Four times a day (QID) | ORAL | 0 refills | Status: AC | PRN
Start: 2020-07-26 — End: 2020-08-02

## 2020-07-26 NOTE — Telephone Encounter (Signed)
[employer] notified NP that pt reported positive home covid test today after travel.    Patient contacted via telephone and reported symptoms started 07/21/20 and worst day so far was yesterday 07/25/20.  Today with cough, congestion, headache, sore throat, post nasal drip, body aches.  Denied fever/chills/wheezing/shortness of breath, n/v/d.  Denied known sick contacts but had international travel to [place] this past week. Pt instructed to begin quarantine x 5 days then strict mask wear for next 10 days when around others and to notify any contacts from this past week that she tested positive and they should covid test/monitor for symptoms also.   5 day quarantine per [facility] recommendations. Day 1 of quarantine was 25 Jul 2020.  Day 5 7/13  Day 10 08/03/20 Patient to contact me if vomiting after coughing or unable to tolerate po fluids.  Has required albuterol inhaler in the past year for bronchitis and does not have any longer.  Has not required steroids.  Discussed electronic Rx sent to her pharmacy of choice for phenylephrine 5-10mg  po q6h prn rhinitis #28 RF0, tessalon pearles 200mg  po TID prn cough #30 RF0, nasal saline 2 sprays each nostril q2h wa #17ml RF0, flonase nasal 40mcg 1 spray each nostril BID #16gm RF0.  Discussed may use otc cough suppressant e.g. nyquil/robitussin/delsym per manufacturer instructions.  Swallow tessalon pearles who do not chew.  May use otc cough lozenges prn per manufacturer instructions.  Honey 1 tablespoon po every 4 hours as needed cough.  Reviewed possible Covid symptoms including cough, shortness of breath with exertion or at rest, runny nose, congestion, sinus pain/pressure, sore throat, fever/chills, body aches, fatigue, loss of taste/smell, GI symptoms of nausea/vomiting/diarrhea. Same day/emergent eval/ER precautions if dizziness/syncope, confusion, blue tint to lips/face, severe shortness of breath/difficulty breathing/wheezing.   Patient to isolate in own  room and if possible use only one bathroom if living with others in home.  Wear mask when out of room to help prevent spread to others in household.  Sanitize high touch surfaces with lysol/chlorox/bleach spray or wipes daily as viruses are known to live on surfaces from 24 hours to days.  Patient has tylenol at home for prn use. Avoid dehydration and drink water to keep urine pale yellow clear and voiding every 2-4 hours while awake.  Patient alert and oriented x3, spoke full sentences without difficulty.  Some nasal congestion noted. No cough or throat clearing noted during 18 minute telephone call.  Discussed with patient she can contact me at this number [phone] when clinic closed if questions or concerns until RN Hildred Alamin returns to clinic on Monday [phone].  RN Hildred Alamin will call patient Monday or Tuesday and I will follow up with her on Wednesday via telephone.   Pt verbalized understanding and agreement with plan of care. No further questions/concerns at this time. Pt reminded to contact clinic with any changes in symptoms or questions/concerns. HR notified patient to quarantine through Day 5 and strict mask wear through Day 10 and no eating in employee lunch room.  Estimated return to work onsite 07/30/20.

## 2020-07-28 NOTE — Telephone Encounter (Signed)
Spoke with pt by phone. She reports sx much better today. Still with runny nose and some nasal congestion. Continuing phenylephrine, Flonase and Tessalon. No cough noted during 5 minute phone call. Sx improving and no v/d/fever so pt cleared to RTW Thurs 7/14 as expected as long as no new or worsening sx prior to return.

## 2020-07-29 NOTE — Telephone Encounter (Signed)
Reviewed RN Hildred Alamin note and agreed with plan of care as symptoms improving.  HR notified cleared to return onsite with strict mask use.

## 2020-07-29 NOTE — Telephone Encounter (Signed)
Patient returned call feeling better and ready to return onsite tomorrow.  Symptoms improving worst symptoms were Saturday and Sunday 9-10 once she started medication on Monday am symptoms improved.  Denied questions or concerns.  Reviewed strict mask wear through Day 10 7/18, use straw under mask to drink fluids at work.  No eating in employee lunchroom through day 10, may use meditation room, outside tables or eating in car.  Patient verbalized understanding information/instructions, agreed with plan of care and had no further questions at this time.  A&OX3 spoke full sentences without difficulty some nasal congestion noted but no coughing or throat clearing during 2 minute telephone call.  HR notified cleared to return onsite tomorrow with strict mask wear through 7/18 Day 10 and no eating in employee lunch room.

## 2020-07-30 NOTE — Telephone Encounter (Signed)
Reviewed RN Hildred Alamin note agreed with plan of care.  Patient seen in her office today no cough/throat clearing or nasal congestion A&Ox3 spoke full sentences without difficulty skin warm dry and pink wearing mask.  Feeling a little tired but otherwise feeling better than previous days of illness.  No questions or concerns at this time.

## 2020-07-30 NOTE — Telephone Encounter (Signed)
Confirmed with pt that she did RTW today 7/14 as expected. Denies any sx, questions or concerns. Closing encounter.

## 2020-08-12 NOTE — Telephone Encounter (Signed)
Patient contacted via telephone and stated still having some cough/congestion and fatigue.  Using tessalon pearles, cough drops.  Stopped mucinex.  Thick yellow sometimes productive cough.  Denied wheezing/dyspnea/shortness of breath/chest pain.  Discussed if phlegm cloudy/tan/wheezing/shortness of breath to come to clinic for evaluation.  Consider restarting mucinex.  Doesn't need refill on tessalon pearles at this time not taking every day.  Discussed robitussin/nyquil/dayquil per manufacturer instructions prn cough.  Phenylephrine 5-'10mg'$  tabs available in clinic. Honey 1 tablespoon every 4 hours prn cough.  Discussed sounds like continued viral inflammation not pneumonia at this time.  Hydrate.  Will follow up in 1 week to see if symptoms improving and if not recommend clinic re-evaluation.  Patient A&Ox3 spoke full sentences without difficulty no cough/congestion/throat clearing noted during 3 minute telephone call.  Patient verbalized understanding information/instructions, agreed with plan of care and had no further questions at this time.

## 2020-08-18 ENCOUNTER — Other Ambulatory Visit: Payer: Self-pay | Admitting: Registered Nurse

## 2020-09-02 NOTE — Telephone Encounter (Signed)
Patient returned call and stated still having cough, sore throat, congestion and post nasal drip.  Denied sinus pain/pressure/fever/chills/dyspnea.  Patient stated she has been getting looks from coworkers due to prolonged coughing.  Patient tried honey and it did not help with mucous did soothe the throat some.  Patient did not trial phenylephrine she will stop in clinic tomorrow to pick up from [facility] and if helps will buy OTC.  Discussed with patient clinic also has nasal saline and cough drops if she needs any also.  Patient A&Ox3 spoke full sentences without difficulty no cough/congestion/throat clearing audible during 4 minute telephone call.  Patient verbalized understanding information/instructions, agreed with plan of care and had no further questions at this time.

## 2020-09-08 ENCOUNTER — Other Ambulatory Visit: Payer: Self-pay | Admitting: Registered Nurse

## 2020-10-01 ENCOUNTER — Telehealth: Payer: Self-pay | Admitting: Registered Nurse

## 2020-10-01 ENCOUNTER — Encounter: Payer: Self-pay | Admitting: Registered Nurse

## 2020-10-01 DIAGNOSIS — R1030 Lower abdominal pain, unspecified: Secondary | ICD-10-CM

## 2020-10-01 DIAGNOSIS — R197 Diarrhea, unspecified: Secondary | ICD-10-CM

## 2020-10-01 NOTE — Telephone Encounter (Signed)
Patient seen in her office today does not need flonase refill.  Rx refused.

## 2020-10-01 NOTE — Telephone Encounter (Signed)
Patient stated recently had rupture of ovarian cyst and having abdomen pain today along with diarrhea.  Discussed with patient typically ovarian cysts do not cause diarrhea.  Typically to have pelvic/low abdomen pain, bloody vaginal discharge if cyst contained blood prior to rupture.  Patient has follow up with GYN scheduled to discuss changes to her birth control to help with ovarian cyst prevention.  Discussed with patient GYN can also perform US/pelvic.    I have recommended clear fluids and advanced to soft as tolerated.  Avoid dairy, spicy and fried foods until diarrhea resolves. Avoid dehydration drink noncaffeinated beverages (water, ginger ale, soup broth, popsicles, no sugar added gatorade/powerade) to urinate every 2-4 hours pale yellow urine.  It is easy to become dehydrated when having diarrhea along with electrolyte imbalances.  Patient to take  temperature and if less than 100.5 F may take over the counter Imodium per manufacturer's instructions.  Patient given exitcare handouts on diarrhea and foods to relieve diarrhea. Medications as directed.  Return to the clinic if symptoms persist or worsen; I have alerted the patient to call if high fever, dehydration, marked weakness, fainting, increased abdominal pain, blood in stool or vomit.  Patient verbalized agreement and understanding of treatment plan and had no further questions at this time. P2: Hand washing and fitness

## 2020-10-01 NOTE — Telephone Encounter (Signed)
Patient seen in her office today stated all URI symptoms resolved and does not need refill on flonase.  A&Ox3 spoke full sentences without difficulty no cough/congestion/throat clearing noted.

## 2020-10-20 ENCOUNTER — Other Ambulatory Visit

## 2020-10-24 NOTE — Telephone Encounter (Signed)
Received error message from Epic regarding refusal of [facility] fluticasone Rx.  Telephone message left for pharmacy staff refill flonase nasal Rx not approved for patient and patient does not need at this time and to contact me at [phone] if further questions or concerns.

## 2020-10-26 IMAGING — US US THYROID
1 series · 14 of 25 positions shown · non-contrast
Comparison: 01/13/2016

CLINICAL DATA: Palpable abnormality. Enlarged thyroid on physical
examination.

EXAM:
THYROID ULTRASOUND
TECHNIQUE: Ultrasound examination of the thyroid gland and adjacent soft
tissues was performed.

[Series 1: us thyroid · 0.05mm/px · 14 of 36 slices shown]
[im 1/36]
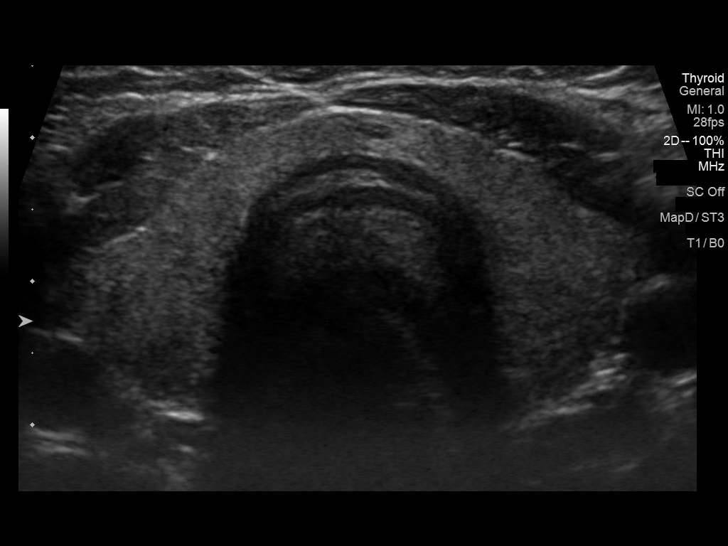
[im 3/36]
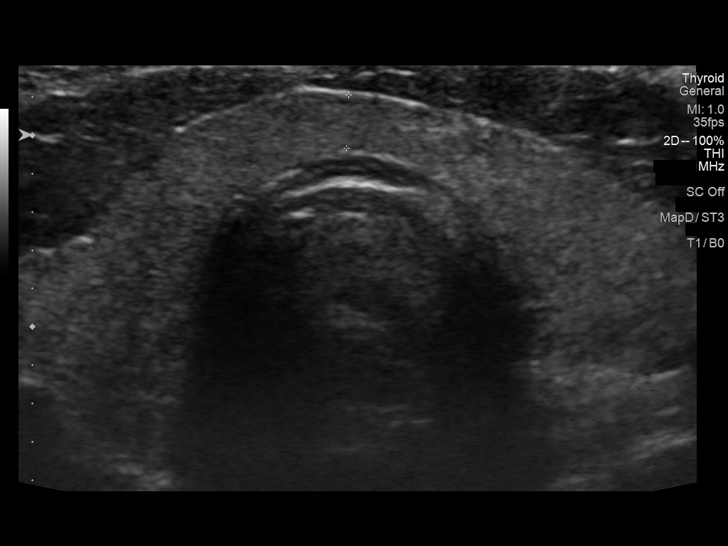
[im 6/36]
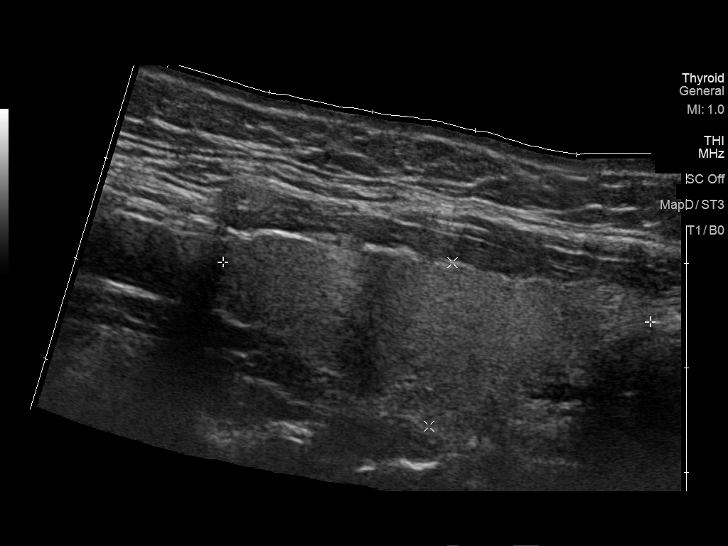
[im 9/36]
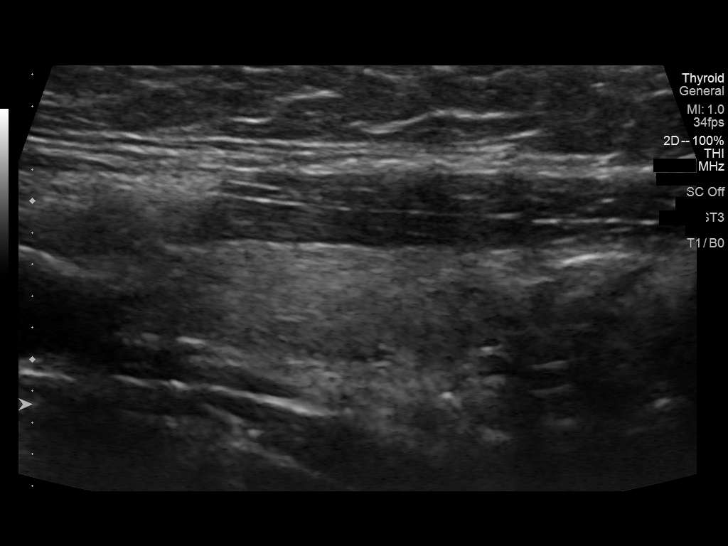
[im 12/36]
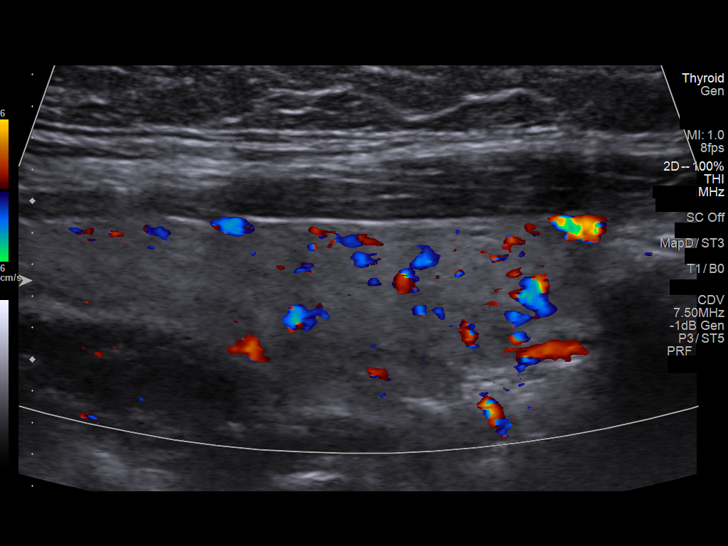
[im 14/36]
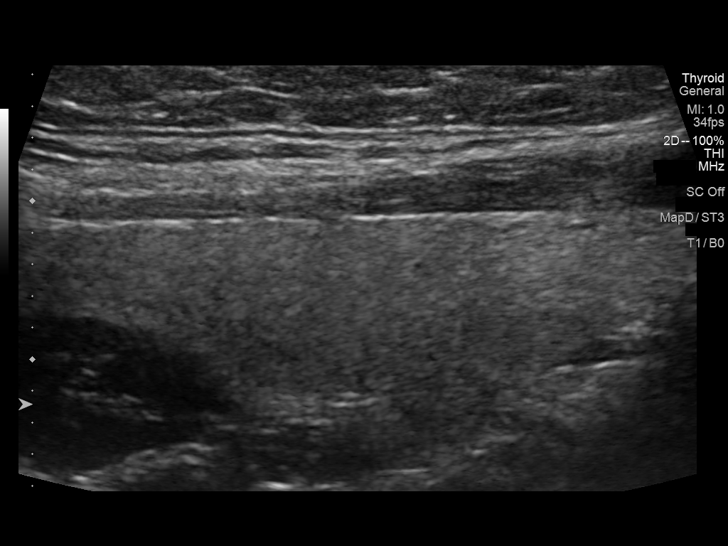
[im 17/36]
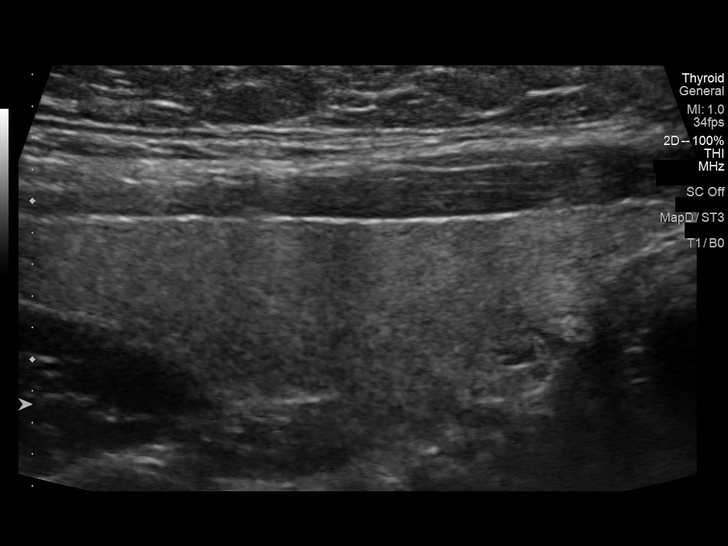
[im 19/36]
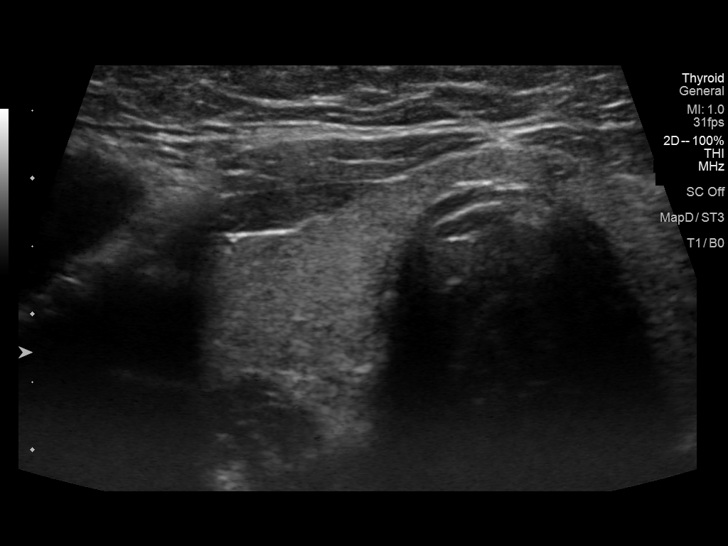
[im 22/36]
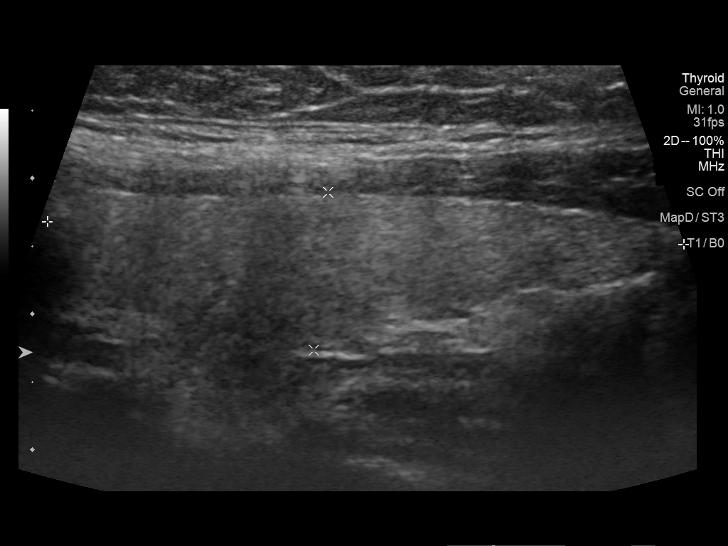
[im 24/36]
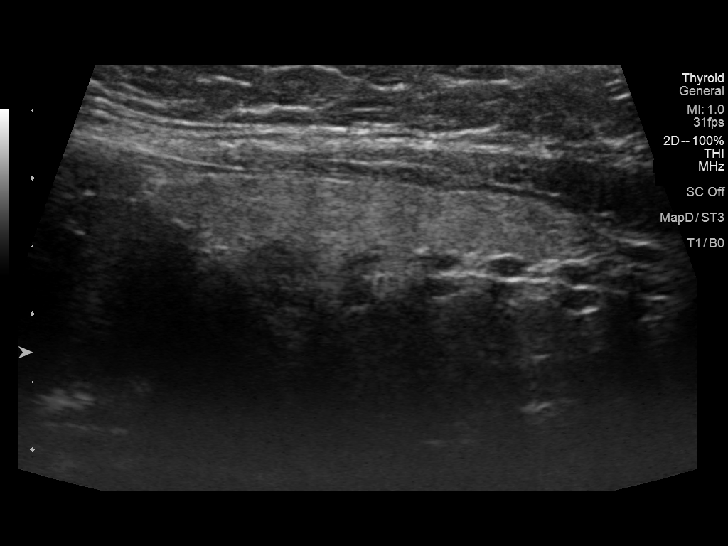
[im 27/36]
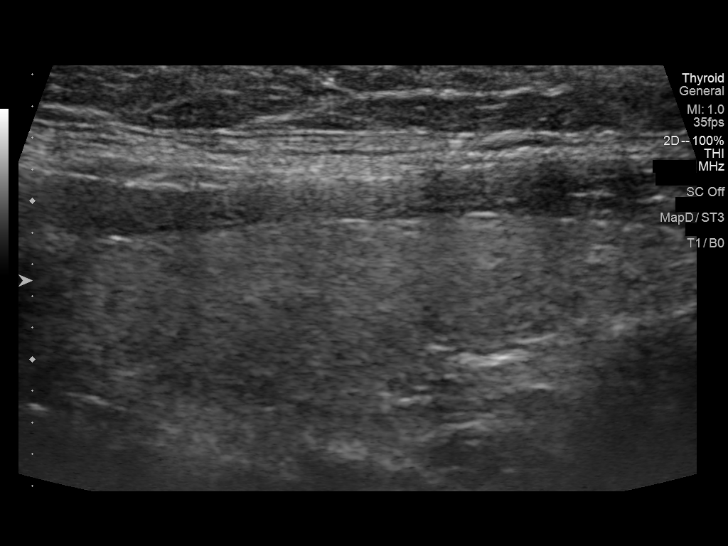
[im 30/36]
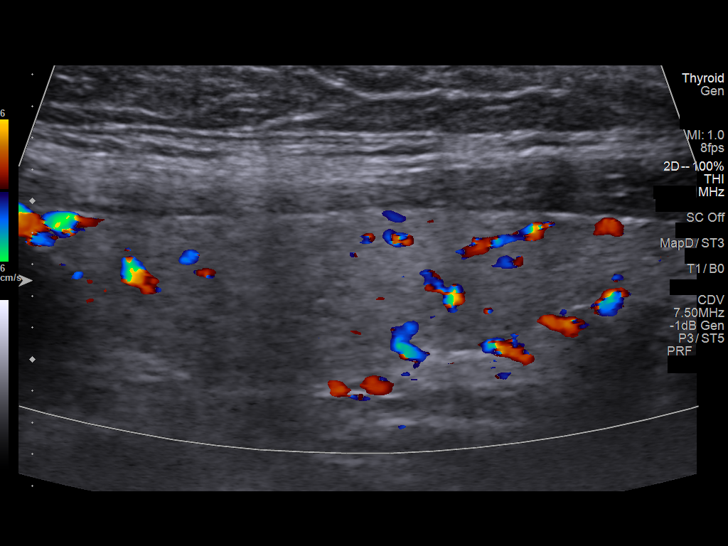
[im 33/36]
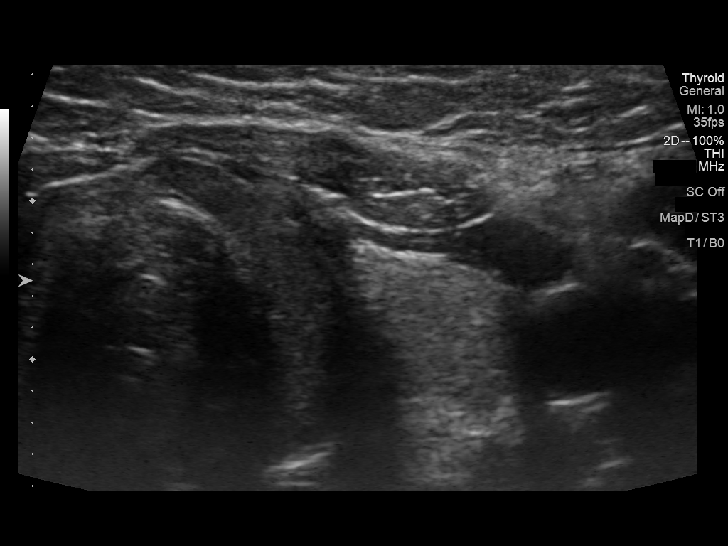
[im 36/36]
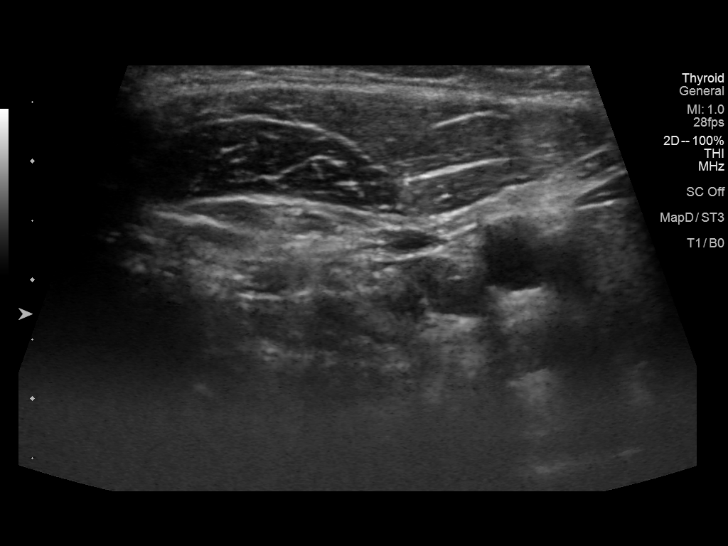

[14 of 25 positions shown; findings below may reference images not displayed]

FINDINGS: Parenchymal Echotexture: Normal

Isthmus: Normal in size measuring 0.3 cm in diameter

Right lobe: Normal in size measuring 4.1 x 1.6 x 1.4 cm, previously,
4.9 x 1.0 x 1.6 cm

Left lobe: Normal in size measuring 4.7 x 1.2 x 1.4 cm, previously,
4.7 x 0.8 x 1.3 cm

_________________________________________________________

Estimated total number of nodules >/= 1 cm: 0

Number of spongiform nodules >/=  2 cm not described below (TR1): 0

Number of mixed cystic and solid nodules >/= 1.5 cm not described
below (TR2): 0

_________________________________________________________

No discrete nodules are seen within the thyroid gland.
IMPRESSION: Normal thyroid ultrasound, unchanged compared to the [DATE]
examination.

Specifically, no evidence of thyromegaly or discrete thyroid nodule
or mass.

## 2020-10-29 ENCOUNTER — Encounter: Admitting: Family Medicine

## 2021-01-11 ENCOUNTER — Telehealth: Payer: Self-pay | Admitting: *Deleted

## 2021-01-11 DIAGNOSIS — Z20822 Contact with and (suspected) exposure to covid-19: Secondary | ICD-10-CM

## 2021-01-11 NOTE — Telephone Encounter (Signed)
Pt notified work that her boyfriend, same household, had tested positive for Covid on 12/24. She tested negative at the same time. She has been having HAs intermittently for past 2 weeks that are not typical for her. No Hx of frequent HAs or migraines. Emails from pt and HR supervisor indicated fatigue and possible cough, but pt reports by phone that HA is only sx.  Has not done 48hr repeat covid test today. Recommended she do this and RN will call back prior to clinic close today to discuss. If positive, will start quarantine and remain out of work. If negative, will be cleared to RTW tomorrow with mask use thru post-exposure Day 10 01/19/21.

## 2021-01-11 NOTE — Telephone Encounter (Signed)
48hr test today was negative. Pt cleared to RTW tomorrow 12/26 with mask use thru 01/19/21.

## 2021-01-12 ENCOUNTER — Encounter: Payer: Self-pay | Admitting: *Deleted

## 2021-01-12 NOTE — Telephone Encounter (Signed)
Reviewed RN Hildred Alamin note agreed with plan of care.  Exitcare handout on covid isolation sent to patient.

## 2021-01-14 NOTE — Telephone Encounter (Signed)
Late entry: Spoke with pt by phone 12/27. She confirmed she did RTW that day and was asymptomatic. Mask use thru 01/19/21

## 2021-01-15 NOTE — Telephone Encounter (Signed)
Reviewed RN Hildred Alamin note RTW 01/12/21 and agreed with plan of care.

## 2021-02-01 NOTE — Telephone Encounter (Signed)
Reviewed RN Hildred Alamin note and patient seen in workcenter feeling well denied concerns.  Spoke full sentences without difficulty, skin warm dry and pink. Gait sure and steady.  No audible cough/throat clearing or congestion.

## 2021-02-01 NOTE — Telephone Encounter (Signed)
Day 10 f/u from 01/19/21. Pt reported all sx resolved prior to her Day 10. Feels well. Denies needs or concerns.

## 2021-03-22 ENCOUNTER — Other Ambulatory Visit: Payer: Self-pay

## 2021-03-22 ENCOUNTER — Ambulatory Visit: Payer: Self-pay | Admitting: *Deleted

## 2021-03-22 VITALS — BP 127/82 | HR 76 | Ht 64.0 in | Wt 182.0 lb

## 2021-03-22 DIAGNOSIS — E559 Vitamin D deficiency, unspecified: Secondary | ICD-10-CM

## 2021-03-22 DIAGNOSIS — Z0000 Encounter for general adult medical examination without abnormal findings: Secondary | ICD-10-CM

## 2021-03-22 NOTE — Progress Notes (Signed)
[employer] insurance premium discount evaluation: Labs Drawn. [employer] form signed. Tobacco Free Attestation form signed.  Forms placed in paper chart.  

## 2021-03-23 ENCOUNTER — Encounter: Payer: Self-pay | Admitting: Registered Nurse

## 2021-03-23 ENCOUNTER — Telehealth: Payer: Self-pay | Admitting: Registered Nurse

## 2021-03-23 DIAGNOSIS — E559 Vitamin D deficiency, unspecified: Secondary | ICD-10-CM | POA: Insufficient documentation

## 2021-03-23 LAB — CMP12+LP+TP+TSH+6AC+CBC/D/PLT
ALT: 11 IU/L (ref 0–32)
AST: 15 IU/L (ref 0–40)
Albumin/Globulin Ratio: 1.6 (ref 1.2–2.2)
Albumin: 4.5 g/dL (ref 3.9–5.0)
Alkaline Phosphatase: 59 IU/L (ref 44–121)
BUN/Creatinine Ratio: 11 (ref 9–23)
BUN: 8 mg/dL (ref 6–20)
Basophils Absolute: 0 10*3/uL (ref 0.0–0.2)
Basos: 1 %
Bilirubin Total: 0.2 mg/dL (ref 0.0–1.2)
Calcium: 9.3 mg/dL (ref 8.7–10.2)
Chloride: 106 mmol/L (ref 96–106)
Chol/HDL Ratio: 4.4 ratio (ref 0.0–4.4)
Cholesterol, Total: 184 mg/dL (ref 100–199)
Creatinine, Ser: 0.71 mg/dL (ref 0.57–1.00)
EOS (ABSOLUTE): 0.1 10*3/uL (ref 0.0–0.4)
Eos: 1 %
Estimated CHD Risk: 1 times avg. (ref 0.0–1.0)
Free Thyroxine Index: 2 (ref 1.2–4.9)
GGT: 9 IU/L (ref 0–60)
Globulin, Total: 2.9 g/dL (ref 1.5–4.5)
Glucose: 76 mg/dL (ref 70–99)
HDL: 42 mg/dL (ref >39)
Hematocrit: 40.5 % (ref 34.0–46.6)
Hemoglobin: 13.3 g/dL (ref 11.1–15.9)
Immature Grans (Abs): 0 10*3/uL (ref 0.0–0.1)
Immature Granulocytes: 0 %
Iron: 76 ug/dL (ref 27–159)
LDH: 185 IU/L (ref 119–226)
LDL Chol Calc (NIH): 110 mg/dL — ABNORMAL HIGH (ref 0–99)
Lymphocytes Absolute: 2.6 10*3/uL (ref 0.7–3.1)
Lymphs: 36 %
MCH: 29.2 pg (ref 26.6–33.0)
MCHC: 32.8 g/dL (ref 31.5–35.7)
MCV: 89 fL (ref 79–97)
Monocytes Absolute: 0.3 10*3/uL (ref 0.1–0.9)
Monocytes: 5 %
Neutrophils Absolute: 4.2 10*3/uL (ref 1.4–7.0)
Neutrophils: 57 %
Phosphorus: 2.9 mg/dL — ABNORMAL LOW (ref 3.0–4.3)
Platelets: 294 10*3/uL (ref 150–450)
Potassium: 4.1 mmol/L (ref 3.5–5.2)
RBC: 4.56 x10E6/uL (ref 3.77–5.28)
RDW: 12.1 % (ref 11.7–15.4)
Sodium: 141 mmol/L (ref 134–144)
T3 Uptake Ratio: 19 % — ABNORMAL LOW (ref 24–39)
T4, Total: 10.4 ug/dL (ref 4.5–12.0)
TSH: 1.35 u[IU]/mL (ref 0.450–4.500)
Total Protein: 7.4 g/dL (ref 6.0–8.5)
Triglycerides: 182 mg/dL — ABNORMAL HIGH (ref 0–149)
Uric Acid: 4.7 mg/dL (ref 2.6–6.2)
VLDL Cholesterol Cal: 32 mg/dL (ref 5–40)
WBC: 7.2 10*3/uL (ref 3.4–10.8)
eGFR: 122 mL/min/{1.73_m2} (ref >59)

## 2021-03-23 LAB — VITAMIN D 25 HYDROXY (VIT D DEFICIENCY, FRACTURES): Vit D, 25-Hydroxy: 17.6 ng/mL — ABNORMAL LOW (ref 30.0–100.0)

## 2021-03-23 LAB — HGB A1C W/O EAG: Hgb A1c MFr Bld: 5.5 % (ref 4.8–5.6)

## 2021-03-23 NOTE — Progress Notes (Signed)
Results discussed with pt by phone. Diet and exercise recommendations discussed re: lipids. Family Hx of high cholesterol. Pt working out (HIIT and walking) 5x/week. Denies etoh use. Will contact again with Vitamin D supplement recommendations per NP Otila Kluver. Pt looking for new pharmacy as she recently moved and will update if Rx needs to be sent in. Routine f/u with pcp. Results routed to pcp per pt request. No further questions/concerns.  ?

## 2021-03-23 NOTE — Progress Notes (Signed)
Noted low carbohydrate/alcohol intake and RN Hildred Alamin will verify pharmacy 3/9 when she returns to clinic.  Clinic closed every Wednesday. ?

## 2021-03-23 NOTE — Addendum Note (Signed)
Addended by: Gerarda Fraction A on: 03/23/2021 02:44 PM ? ? Modules accepted: Orders ? ?

## 2021-03-25 ENCOUNTER — Encounter: Payer: Self-pay | Admitting: Registered Nurse

## 2021-03-25 MED ORDER — CHOLECALCIFEROL 1.25 MG (50000 UT) PO TABS: 1.0000 | ORAL_TABLET | ORAL | 0 refills | Status: AC

## 2021-03-25 NOTE — Telephone Encounter (Signed)
Patient reported reviewed my chart message did not have questions.  [facility] her preferred pharmacy.  Discussed to take once a week by mouth with fatty foods and repeat nonfasting lab in 3 months.  Patient verbalized understanding information/instructions, agreed with plan of care and had no further questions at this time.  See results note also. ?

## 2021-03-30 ENCOUNTER — Ambulatory Visit: Payer: Self-pay | Admitting: Registered Nurse

## 2021-03-30 VITALS — Temp 97.9°F

## 2021-03-30 DIAGNOSIS — J029 Acute pharyngitis, unspecified: Secondary | ICD-10-CM

## 2021-03-30 MED ORDER — ACETAMINOPHEN 500 MG PO TABS: 1000.0000 mg | ORAL_TABLET | Freq: Four times a day (QID) | ORAL | 0 refills | Status: AC | PRN

## 2021-03-30 NOTE — Progress Notes (Signed)
? ?Subjective:  ? ? Patient ID: Haley Salazar, female    DOB: [birth year], 26 y.o.   MRN: [id number] ? ?25y/o caucasian female established patient with new onset sore throat and close covid contacts 03/26/21 x 2.  Covid tested negative today home test.  Denied fever/chills/enlarged lymph nodes/n/v/d/cough/congestion//white spots in throat/dysphagia/dysphasia.  Took tylenol this am. ? ? ? ? ?Review of Systems  ?Constitutional:  Negative for activity change, appetite change, chills, diaphoresis, fatigue and fever.  ?HENT:  Positive for sore throat. Negative for congestion, dental problem, drooling, ear discharge, ear pain, facial swelling, hearing loss, mouth sores, nosebleeds, postnasal drip, rhinorrhea, sinus pressure, sinus pain, tinnitus, trouble swallowing and voice change.   ?Eyes:  Negative for photophobia and visual disturbance.  ?Respiratory:  Negative for cough, shortness of breath, wheezing and stridor.   ?Cardiovascular:  Negative for chest pain.  ?Gastrointestinal:  Negative for abdominal pain, diarrhea, nausea and vomiting.  ?Endocrine: Negative for cold intolerance and heat intolerance.  ?Genitourinary:  Negative for difficulty urinating.  ?Musculoskeletal:  Negative for neck pain and neck stiffness.  ?Skin:  Negative for rash.  ?Allergic/Immunologic: Negative for food allergies.  ?Neurological:  Negative for dizziness, tremors, seizures, syncope, facial asymmetry, speech difficulty, weakness, light-headedness, numbness and headaches.  ?Hematological:  Negative for adenopathy. Does not bruise/bleed easily.  ?Psychiatric/Behavioral:  Negative for agitation, confusion and sleep disturbance.   ? ?   ?Objective:  ? Physical Exam ?Vitals and nursing note reviewed.  ?Constitutional:   ?   General: She is awake. She is not in acute distress. ?   Appearance: Normal appearance. She is well-developed, well-groomed and overweight. She is not ill-appearing, toxic-appearing or diaphoretic.  ?HENT:  ?   Head:  Normocephalic and atraumatic.  ?   Jaw: There is normal jaw occlusion. No trismus, tenderness, swelling or pain on movement.  ?   Salivary Glands: Right salivary gland is not diffusely enlarged or tender. Left salivary gland is not diffusely enlarged or tender.  ?   Right Ear: Hearing, ear Salazar and external ear normal. A middle ear effusion is present.  ?   Left Ear: Hearing, ear Salazar and external ear normal. A middle ear effusion is present.  ?   Nose: Mucosal edema, congestion and rhinorrhea present. No nasal deformity, septal deviation or laceration. Rhinorrhea is clear.  ?   Right Turbinates: Not enlarged, swollen or pale.  ?   Left Turbinates: Not enlarged or swollen.  ?   Right Sinus: Maxillary sinus tenderness present. No frontal sinus tenderness.  ?   Left Sinus: Maxillary sinus tenderness present. No frontal sinus tenderness.  ?   Comments: Maxillary sinuses mildly TTP bilaterally ?   Mouth/Throat:  ?   Lips: Pink. No lesions.  ?   Mouth: Mucous membranes are moist. Mucous membranes are not pale, not dry and not cyanotic. No lacerations, oral lesions or angioedema.  ?   Dentition: No dental abscesses or gum lesions.  ?   Tongue: No lesions. Tongue does not deviate from midline.  ?   Palate: No mass and lesions.  ?   Pharynx: Uvula midline. Pharyngeal swelling and posterior oropharyngeal erythema present. No oropharyngeal exudate or uvula swelling.  ?   Tonsils: No tonsillar exudate or tonsillar abscesses. 1+ on the right. 1+ on the left.  ?   Comments: Bilateral allergic shiners; cobblestoning posterior phaynx; bilateral tonsils edema 1+/4 no exudate; bilateral TMs air fluid level clear; patient wearing cloth mask in clinic ?Eyes:  ?  General: Lids are normal. Vision grossly intact. Gaze aligned appropriately. Allergic shiner present. No visual field deficit or scleral icterus.    ?   Right eye: No foreign body, discharge or hordeolum.     ?   Left eye: No foreign body, discharge or hordeolum.  ?    Extraocular Movements: Extraocular movements intact.  ?   Right eye: Normal extraocular motion and no nystagmus.  ?   Left eye: Normal extraocular motion and no nystagmus.  ?   Conjunctiva/sclera: Conjunctivae normal.  ?   Right eye: Right conjunctiva is not injected. No chemosis, exudate or hemorrhage. ?   Left eye: Left conjunctiva is not injected. No chemosis, exudate or hemorrhage. ?   Pupils: Pupils are equal, round, and reactive to light. Pupils are equal.  ?   Right eye: Pupil is round and reactive.  ?   Left eye: Pupil is round and reactive.  ?Neck:  ?   Thyroid: No thyroid mass or thyromegaly.  ?   Trachea: Trachea and phonation normal. No tracheal tenderness or tracheal deviation.  ?Cardiovascular:  ?   Rate and Rhythm: Normal rate and regular rhythm.  ?   Pulses:     ?     Radial pulses are 2+ on the right side and 2+ on the left side.  ?Pulmonary:  ?   Effort: Pulmonary effort is normal. No accessory muscle usage or respiratory distress.  ?   Breath sounds: Normal breath sounds and air entry. No stridor or transmitted upper airway sounds. No decreased breath sounds, wheezing, rhonchi or rales.  ?   Comments: Spoke full sentences without difficulty; no cough/throat clearing observed in clinic; wearing cloth mask ?Chest:  ?   Chest wall: No tenderness.  ?Abdominal:  ?   General: There is no distension.  ?   Palpations: Abdomen is soft.  ?Musculoskeletal:     ?   General: No tenderness. Normal range of motion.  ?   Right shoulder: Normal.  ?   Left shoulder: Normal.  ?   Right hand: Normal.  ?   Left hand: Normal.  ?   Cervical back: Normal range of motion and neck supple. No swelling, edema, deformity, erythema, signs of trauma, lacerations, rigidity, tenderness or crepitus. No pain with movement. Normal range of motion.  ?   Thoracic back: No swelling, edema, deformity, signs of trauma or lacerations. Normal range of motion.  ?   Right hip: Normal.  ?   Left hip: Normal.  ?   Right knee: Normal.  ?   Left  knee: Normal.  ?Lymphadenopathy:  ?   Head:  ?   Right side of head: No submental, submandibular, tonsillar, preauricular, posterior auricular or occipital adenopathy.  ?   Left side of head: No submental, submandibular, tonsillar, preauricular, posterior auricular or occipital adenopathy.  ?   Cervical: No cervical adenopathy.  ?   Right cervical: No superficial, deep or posterior cervical adenopathy. ?   Left cervical: No superficial, deep or posterior cervical adenopathy.  ?Skin: ?   General: Skin is warm and dry.  ?   Capillary Refill: Capillary refill takes less than 2 seconds.  ?   Coloration: Skin is not ashen, cyanotic, jaundiced, mottled, pale or sallow.  ?   Findings: No abrasion, abscess, acne, bruising, burn, ecchymosis, erythema, signs of injury, laceration, lesion, petechiae, rash or wound.  ?   Nails: There is no clubbing.  ?Neurological:  ?   General: No focal  deficit present.  ?   Mental Status: She is alert and oriented to person, place, and time. She is not disoriented.  ?   GCS: GCS eye subscore is 4. GCS verbal subscore is 5. GCS motor subscore is 6.  ?   Cranial Nerves: Cranial nerves 2-12 are intact. No cranial nerve deficit, dysarthria or facial asymmetry.  ?   Sensory: Sensation is intact. No sensory deficit.  ?   Motor: Motor function is intact. No weakness, tremor, atrophy, abnormal muscle tone or seizure activity.  ?   Coordination: Coordination is intact. Coordination normal.  ?   Gait: Gait is intact. Gait normal.  ?   Comments: In/out of chair and on/off exam table without difficulty; bilateral hand grasp equal 5/5; gait sure and steady in clinic  ?Psychiatric:     ?   Attention and Perception: Attention and perception normal.     ?   Mood and Affect: Mood and affect normal.     ?   Speech: Speech normal.     ?   Behavior: Behavior normal. Behavior is cooperative.     ?   Thought Content: Thought content normal.     ?   Cognition and Memory: Cognition and memory normal.     ?    Judgment: Judgment normal.  ? ? ? ? ?Enlarged tonsils 1+/4 no exudate cobblestoning posterior pharynx; bilateral allergic shiners; bilateral TMs air fluid level clear wearing mask no cough/throat clearing/nasal conge

## 2021-03-30 NOTE — Patient Instructions (Addendum)
Repeat covid test tomorrow ? ?Wear mask at work and no eating in employee lunch room ? ?Pharyngitis ?Pharyngitis is inflammation of the throat (pharynx). It is a very common cause of sore throat. Pharyngitis can be caused by a bacteria, but it is usually caused by a virus. Most cases of pharyngitis get better on their own without treatment. ?What are the causes? ?This condition may be caused by: ?Infection by viruses (viral). Viral pharyngitis spreads easily from person to person (is contagious) through coughing, sneezing, and sharing of personal items or utensils such as cups, forks, spoons, and toothbrushes. ?Infection by bacteria (bacterial). Bacterial pharyngitis may be spread by touching the nose or face after coming in contact with the bacteria, or through close contact, such as kissing. ?Allergies. Allergies can cause buildup of mucus in the throat (post-nasal drip), leading to inflammation and irritation. Allergies can also cause blocked nasal passages, forcing breathing through the mouth, which dries and irritates the throat. ?What increases the risk? ?You are more likely to develop this condition if: ?You are 25-25 years old. ?You are exposed to crowded environments such as daycare, school, or dormitory living. ?You live in a cold climate. ?You have a weakened disease-fighting (immune) system. ?What are the signs or symptoms? ?Symptoms of this condition vary by the cause. Common symptoms of this condition include: ?Sore throat. ?Fatigue. ?Low-grade fever. ?Stuffy nose (nasal congestion) and cough. ?Headache. ?Other symptoms may include: ?Glands in the neck (lymph nodes) that are swollen. ?Skin rashes. ?Plaque-like film on the throat or tonsils. This is often a symptom of bacterial pharyngitis. ?Vomiting. ?Red, itchy eyes (conjunctivitis). ?Loss of appetite. ?Joint pain and muscle aches. ?Enlarged tonsils. ?How is this diagnosed? ?This condition may be diagnosed based on your medical history and a physical  exam. Your health care provider will ask you questions about your illness and your symptoms. ?A swab of your throat may be done to check for bacteria (rapid strep test). Other lab tests may also be done, depending on the suspected cause, but these are rare. ?How is this treated? ?Many times, treatment is not needed for this condition. Pharyngitis usually gets better in 3-4 days without treatment. ?Bacterial pharyngitis may be treated with antibiotic medicines. ?Follow these instructions at home: ?Medicines ?Take over-the-counter and prescription medicines only as told by your health care provider. ?If you were prescribed an antibiotic medicine, take it as told by your health care provider. Do not stop taking the antibiotic even if you start to feel better. ?Use throat sprays to soothe your throat as told by your health care provider. ?Children can get pharyngitis. Do not give your child aspirin because of the association with Reye's syndrome. ?Managing pain ?To help with pain, try: ?Sipping warm liquids, such as broth, herbal tea, or warm water. ?Eating or drinking cold or frozen liquids, such as frozen ice pops. ?Gargling with a mixture of salt and water 3-4 times a day or as needed. To make salt water, completely dissolve ?-1 tsp (3-6 g) of salt in 1 cup (237 mL) of warm water. ?Sucking on hard candy or throat lozenges. ?Putting a cool-mist humidifier in your bedroom at night to moisten the air. ?Sitting in the bathroom with the door closed for 5-10 minutes while you run hot water in the shower. ? ?General instructions ? ?Do not use any products that contain nicotine or tobacco. These products include cigarettes, chewing tobacco, and vaping devices, such as e-cigarettes. If you need help quitting, ask your health care provider. ?Rest  as told by your health care provider. ?Drink enough fluid to keep your urine pale yellow. ?How is this prevented? ?To help prevent becoming infected or spreading infection: ?Wash your  hands often with soap and water for at least 20 seconds. If soap and water are not available, use hand sanitizer. ?Do not touch your eyes, nose, or mouth with unwashed hands, and wash hands after touching these areas. ?Do not share cups or eating utensils. ?Avoid close contact with people who are sick. ?Contact a health care provider if: ?You have large, tender lumps in your neck. ?You have a rash. ?You cough up green, yellow-brown, or bloody mucus. ?Get help right away if: ?Your neck becomes stiff. ?You drool or are unable to swallow liquids. ?You cannot drink or take medicines without vomiting. ?You have severe pain that does not go away, even after you take medicine. ?You have trouble breathing, and it is not caused by a stuffy nose. ?You have new pain and swelling in your joints such as the knees, ankles, wrists, or elbows. ?These symptoms may represent a serious problem that is an emergency. Do not wait to see if the symptoms will go away. Get medical help right away. Call your local emergency services (911 in the [place] Do not drive yourself to the hospital. ?Summary ?Pharyngitis is redness, pain, and swelling (inflammation) of the throat (pharynx). ?While pharyngitis can be caused by a bacteria, the most common causes are viral. ?Most cases of pharyngitis get better on their own without treatment. ?Bacterial pharyngitis is treated with antibiotic medicines. ?This information is not intended to replace advice given to you by your health care provider. Make sure you discuss any questions you have with your health care provider. ?Document Revised: 04/01/2020 Document Reviewed: 04/01/2020 ?Elsevier Patient Education ? [place]. ?Tonsillitis ?Tonsillitis is an infection of the throat that causes the tonsils to become red, tender, and swollen. Tonsils are tissues in the back of your throat. Each tonsil has crevices (crypts). Tonsils normally work to protect the body from infection. ?What are the  causes? ?Sudden (acute) tonsillitis may be caused by a virus or bacteria, including streptococcal bacteria. Long-lasting (chronic) tonsillitis occurs when the crypts of the tonsils become filled with pieces of food and bacteria, which makes it easy for the tonsils to become repeatedly infected. ?Tonsillitis can be spread from person to person when it is caused by a virus or bacteria. It may be spread by inhaling droplets that are released with coughing or sneezing. You may also come into contact with viruses or bacteria on surfaces, such as cups or utensils. ?What are the signs or symptoms? ?Symptoms of this condition include: ?A sore throat. This may include trouble swallowing. ?White patches on the tonsils. ?Swollen tonsils. ?Fever. ?Headache. ?Tiredness. ?Loss of appetite. ?Snoring during sleep when you did not snore before. ?Small, foul-smelling, yellowish-white pieces of material (tonsilloliths) that you occasionally cough up or spit out. These can cause you to have bad breath. ?How is this diagnosed? ?This condition is diagnosed with a physical exam. Diagnosis can be confirmed with the results of lab tests, including a throat culture. ?How is this treated? ?Treatment for this condition depends on the cause, but usually focuses on treating the symptoms associated with it. Treatment may include: ?Medicines to relieve pain and manage fever. ?Steroid medicines to reduce swelling. ?Antibiotic medicines if the condition is caused by bacteria. ?If episodes of tonsillitis are severe and frequent, your health care provider may recommend surgery to remove the  tonsils (tonsillectomy). ?Follow these instructions at home: ?Medicines ?Take over-the-counter and prescription medicines only as told by your health care provider. ?If you were prescribed an antibiotic medicine, take it as told by your health care provider. Do not stop taking the antibiotic even if you start to feel better. ?Eating and drinking ?Drink enough fluid  to keep your urine pale yellow. ?While your throat is sore, eat soft or liquid foods, such as sherbet, soups, or soft, warm cereals, such as oatmeal or hot wheat cereal. ?Drink warm liquids. ?Eat frozen ice po

## 2021-03-31 ENCOUNTER — Encounter: Payer: Self-pay | Admitting: Registered Nurse

## 2021-03-31 ENCOUNTER — Telehealth: Payer: Self-pay | Admitting: Registered Nurse

## 2021-03-31 DIAGNOSIS — Z20822 Contact with and (suspected) exposure to covid-19: Secondary | ICD-10-CM

## 2021-03-31 NOTE — Telephone Encounter (Signed)
Patient contacted via telephone stated woke up congestion and worse sore throat today.  Slept in until 0815 called out sick from work.  Took home covid test negative again this am but unsure if nose bleed affected results.  Patient has one more test at home.  Discussed repeat tomorrow am.  Continue plan of care as discussed yesterday in clinic.  Hydrate, tylenol prn, honey 1 tablespoon every 4 hours prn cough/sore throat.  Patient reported resting today, isolating from others.  Discussed again could be covid, adenovirus, parainfluenza virus, mononucleosis, post nasal drip from allergies/virus, strep.  Patient denied fever/chills/tonsillar exudate/trouble swallowing/breathing.  Congestion improved this afternoon and tylenol helping sore throat.  Patient to contact clinic staff if white spots in back of throat, worsening symptoms, positive covid test or concerns.  Patient verbalized understanding information/instructions, agreed with plan of care and had no further questions at this time. ?

## 2021-04-01 NOTE — Telephone Encounter (Signed)
Spoke with pt by phone. Congestion, sore throat, raspy voice today still but improved from yesterday. No new or worsening sx. Negative covid test again this morning. Advised no additional covid testing indicated at this time unless new sx develop. Continue home care as previously discussed with NP. Working remote today. Likely will tomorrow as well. Once feeling well enough to return onsite, mask wear as long as having cough or sneezing. Pt verbalizes understanding and agreement. Denies further questions or concerns. ?

## 2021-04-01 NOTE — Telephone Encounter (Signed)
Case discussed with RN Hildred Alamin in clinic today; reviewed note agreed with plan of care repeat covid test negative.  Patient not onsite today attempted visit in workcenter/working remote. ?

## 2021-04-05 NOTE — Telephone Encounter (Signed)
Left message for symptom update earlier today for patient at work.  She left message for NP "Yes I am feeling a lot better. My sore throat is mostly all cleared up. There is a little soreness left, but it is not bothersome. I am still a little congested and still have some lingering mucus in my throat, but once again nothing bad. The worst is probably the lingering mucus in my throat, that is the most noticeable of my symptoms currently.  I am at work wearing a mask till the symptoms go away all the way. But I feel well enough to work and be productive. No headache or anything either"  Left message for patient continue hydrating and notify clinic staff if new or worsening symptoms this week. ?

## 2021-04-06 NOTE — Telephone Encounter (Signed)
Patient reported feeling well today denied concerns.  No audible cough/congestion/throat clearing.  Spoke full sentences without difficulty.  Skin warm dry and pink.  Respirations even and unlabored.  Discussed with patient news article this am stated metapneumovirus, parainfluenza virus and adenovirus still on upswing in [place] and flu/covid decreasing.  Most likely she had one of the 3 upswing viruses last week.  Patient verbalized understanding information and had no further questions at this time.  Encounter closed. ?

## 2021-07-02 IMAGING — US US PELVIS COMPLETE WITH TRANSVAGINAL
1 series · 13 of 25 positions shown · non-contrast
Comparison: None

CLINICAL DATA: Question of PCOS, evaluate ovaries.

EXAM:
TRANSABDOMINAL AND TRANSVAGINAL ULTRASOUND OF PELVIS
TECHNIQUE: Both transabdominal and transvaginal ultrasound examinations of the
pelvis were performed. Transabdominal technique was performed for
global imaging of the pelvis including uterus, ovaries, adnexal
regions, and pelvic cul-de-sac. It was necessary to proceed with
endovaginal exam following the transabdominal exam to visualize the
uterus, endometrium as well as LEFT and RIGHT ovary.

[Series 1: us pelvis complete with transvaginal · 0.22mm/px · 13 of 84 slices shown]
[im 1/84]
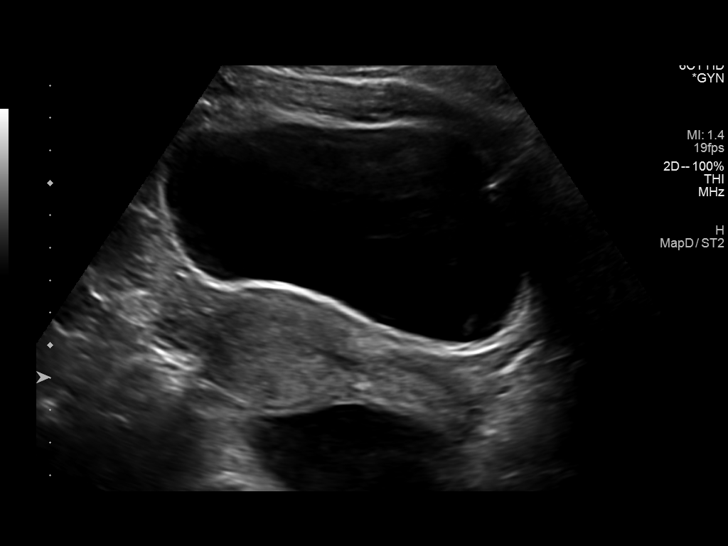
[im 7/84]
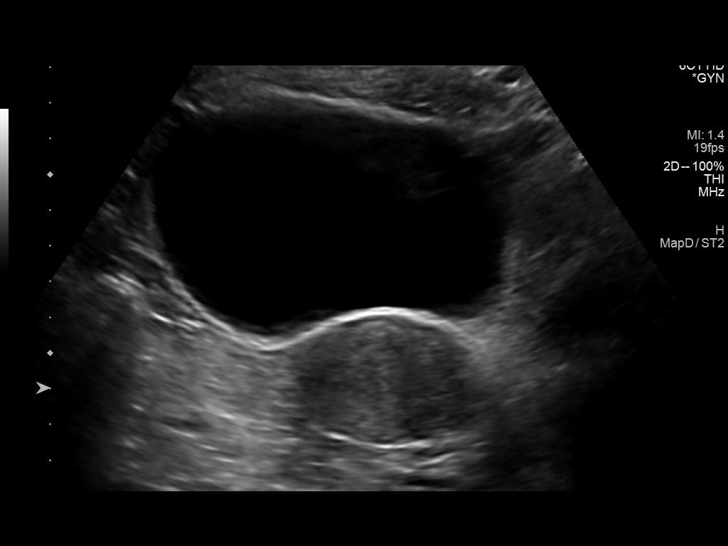
[im 14/84]
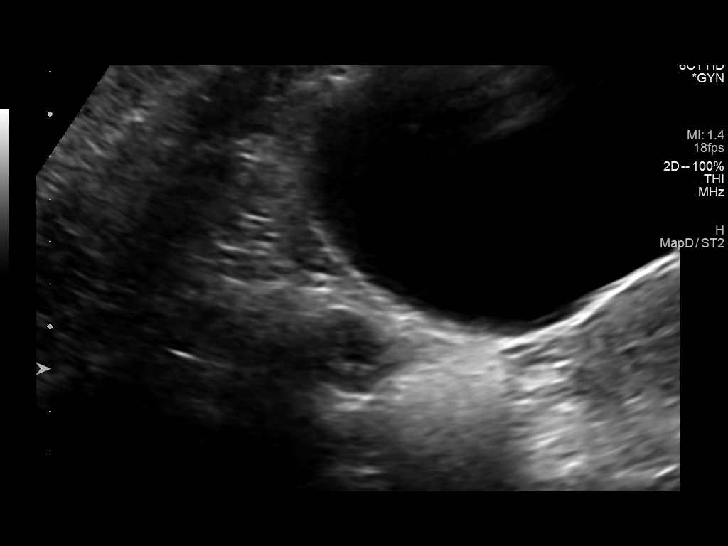
[im 21/84]
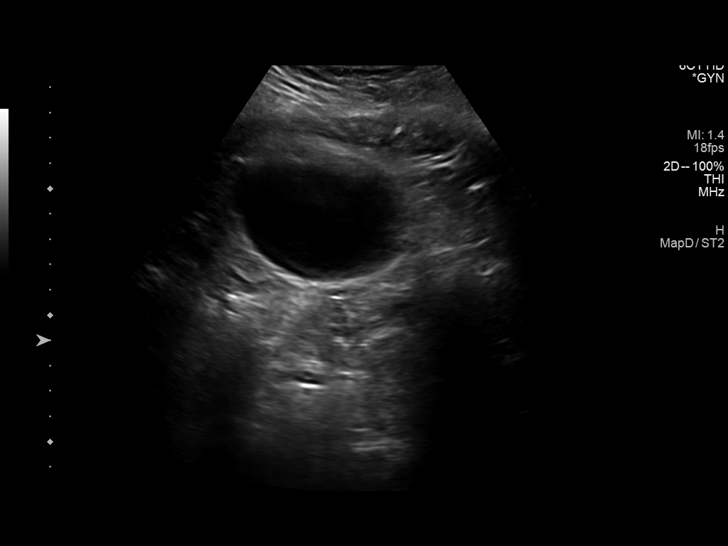
[im 28/84]
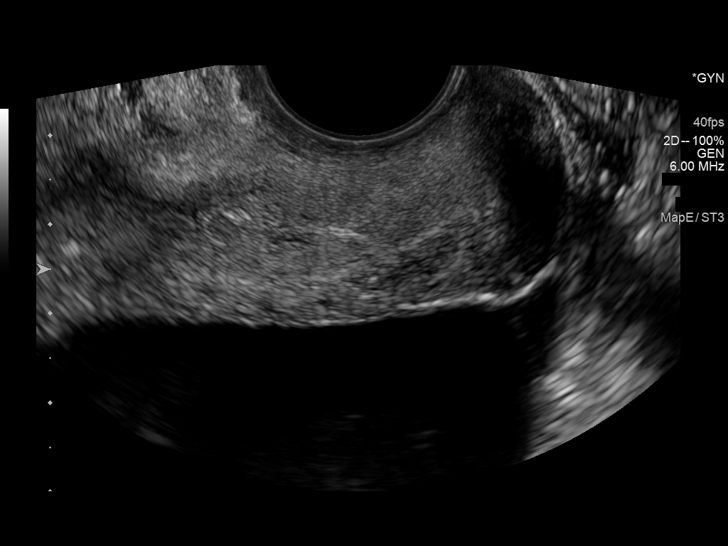
[im 35/84]
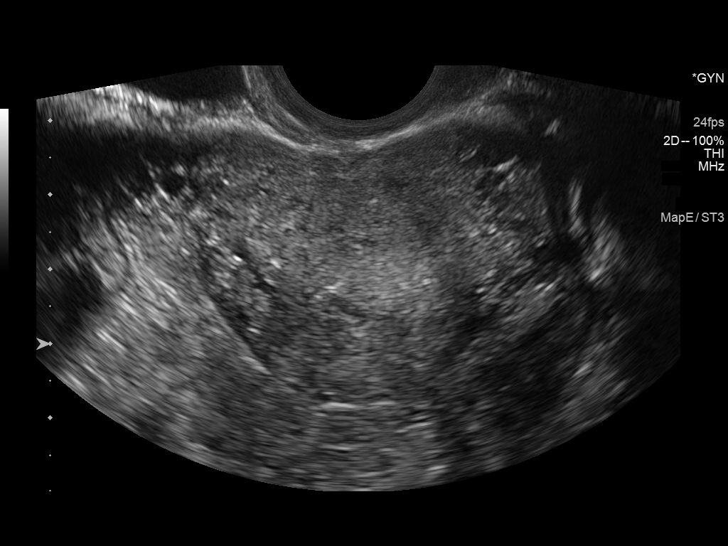
[im 42/84]
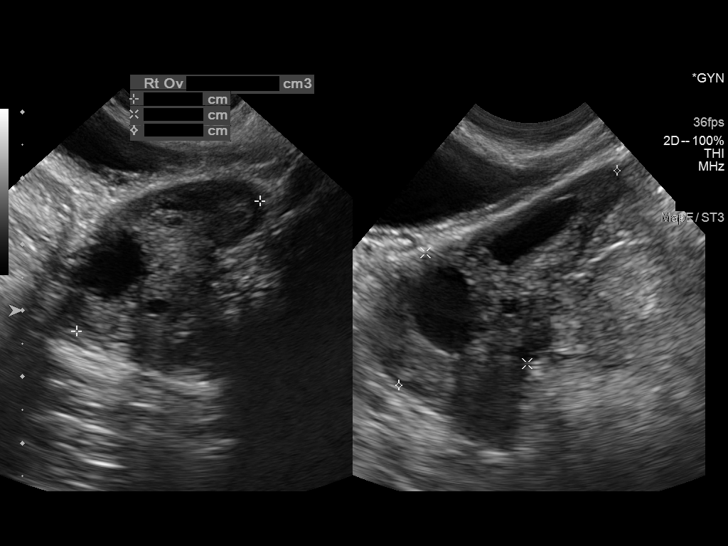
[im 49/84]
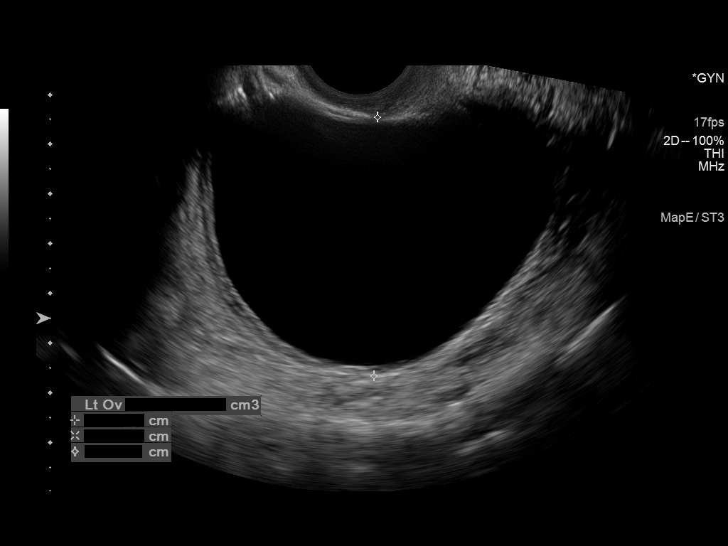
[im 56/84]
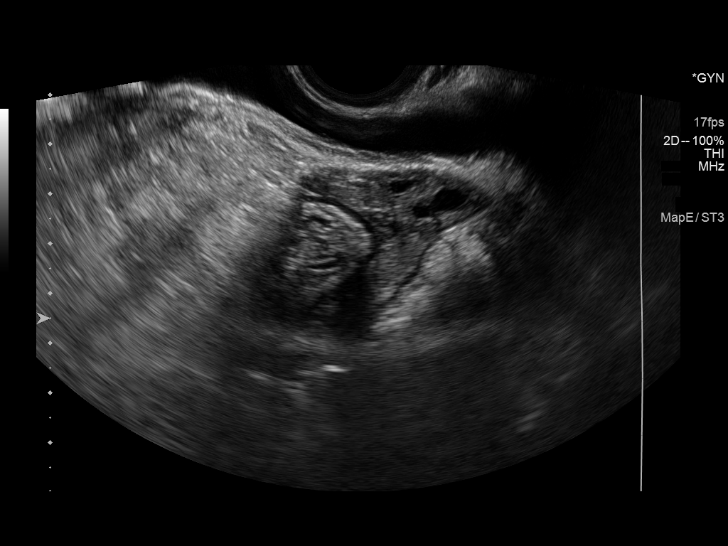
[im 63/84]
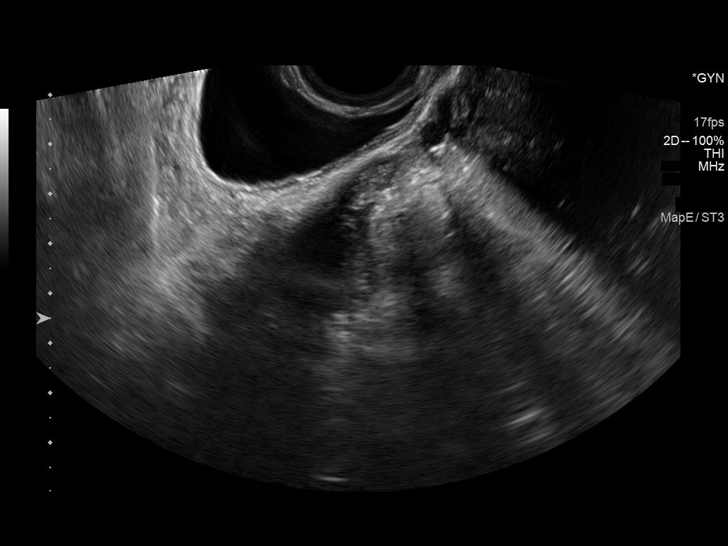
[im 70/84]
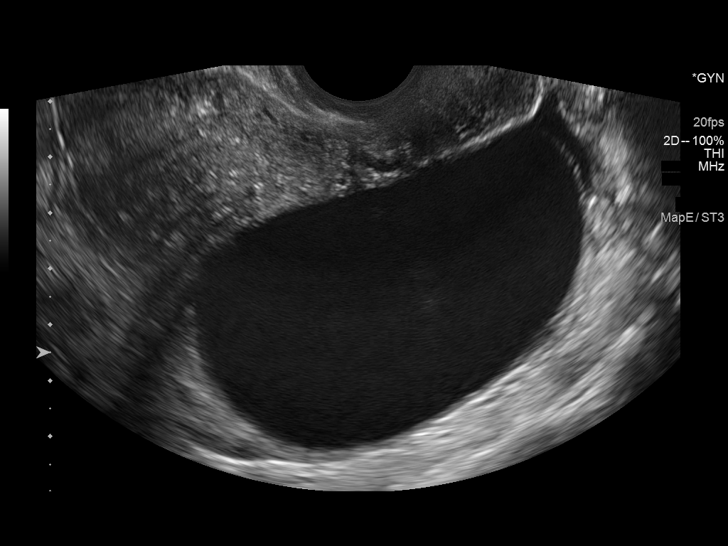
[im 77/84]
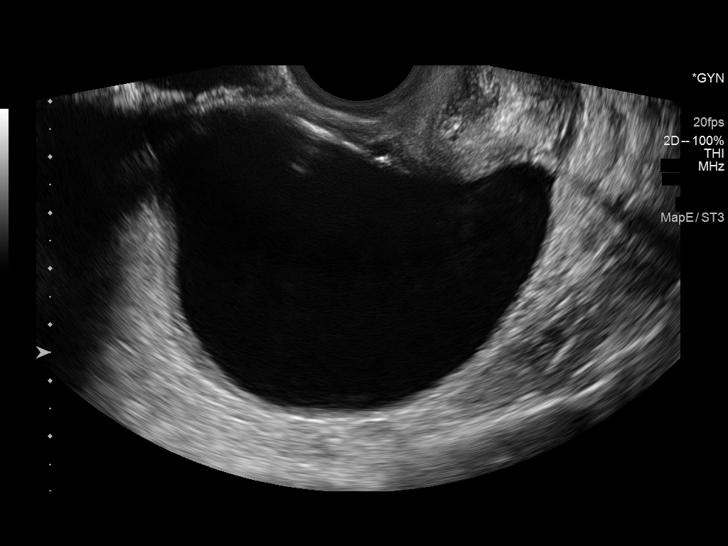
[im 84/84]
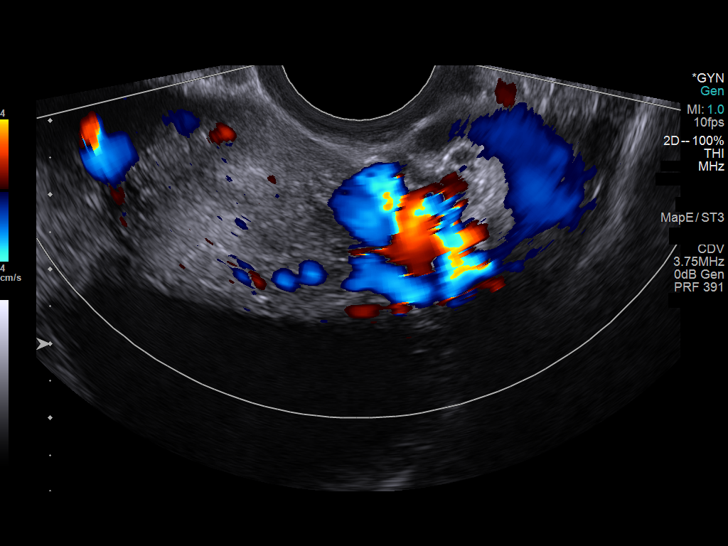

[13 of 25 positions shown; findings below may reference images not displayed]

FINDINGS: Uterus

Measurements: 9.3 x 3.6 x 5.0 cm = volume: 88 mL. No fibroids or
other mass visualized. Anteflexed uterus.

Endometrium

Thickness: 11 mm.  No focal abnormality visualized.

Right ovary

Measurements: 3.4 x 2.0 x 4.0 cm = volume: 14 mL. Normal
appearance/no adnexal mass.

Left ovary

Measurements: 9.8 x 5.6 x 5.2 cm = volume: 150 mL. Ovarian tissue
seen at the edge of a large simple appearing cyst arising from the
LEFT ovary and extending posterior to the uterus. Cyst measuring
x 5.0 x 6.5 cm.

Other findings

Trace fluid in the pelvis.
IMPRESSION: Large LEFT ovarian cyst. Measuring greater than 7 cm greatest
dimension. Correlate with any pelvic pain or discomfort. Recommend
follow-up US in 3-6 months. Note: This recommendation does not apply
to premenarchal patients or to those with increased risk (genetic,
family history, elevated tumor markers or other high-risk factors)
of ovarian cancer. Reference: Radiology [DATE]):[id number].

No specific signs of PCOS.

## 2021-10-11 ENCOUNTER — Encounter: Payer: Self-pay | Admitting: *Deleted

## 2021-10-12 ENCOUNTER — Ambulatory Visit: Payer: Self-pay | Admitting: Registered Nurse

## 2021-10-12 ENCOUNTER — Encounter: Payer: Self-pay | Admitting: Registered Nurse

## 2021-10-12 VITALS — BP 129/96 | HR 90 | Temp 97.2°F | Resp 16

## 2021-10-12 DIAGNOSIS — J029 Acute pharyngitis, unspecified: Secondary | ICD-10-CM

## 2021-10-12 MED ORDER — FLUTICASONE PROPIONATE 50 MCG/ACT NA SUSP: 1.0000 | Freq: Two times a day (BID) | NASAL | 0 refills | Status: DC

## 2021-10-12 MED ORDER — SALINE SPRAY 0.65 % NA SOLN: 2.0000 | NASAL | 0 refills | Status: DC

## 2021-10-12 NOTE — Progress Notes (Signed)
Subjective:    Patient ID: Haley Salazar, female    DOB: 1995-11-10, 26 y.o.   MRN: [id number]  26y/o caucasian established female patient here for evaluation of sore throat.  Was seen at urgent care flu/covid/strep negative.  Home covid test 0300 today negative. Having some post nasal drip.  Denied close contact with known sick individuals.  Feeling hot/cold but no fever on thermometer.  Denied dysphagia/dysphasia/wheezing/shortness of breath/white patches in back of throat/enlarged lymph nodes in neck.      Review of Systems  Constitutional:  Positive for chills and diaphoresis. Negative for activity change, appetite change and fever.  HENT:  Positive for congestion, postnasal drip and sore throat. Negative for mouth sores, sinus pressure, sinus pain, tinnitus, trouble swallowing and voice change.   Eyes:  Negative for photophobia and visual disturbance.  Respiratory:  Negative for cough, shortness of breath, wheezing and stridor.   Cardiovascular:  Negative for chest pain.  Gastrointestinal:  Negative for diarrhea, nausea and vomiting.  Endocrine: Negative for cold intolerance and heat intolerance.  Genitourinary:  Negative for difficulty urinating.  Musculoskeletal:  Negative for gait problem, neck pain and neck stiffness.  Skin:  Negative for rash.  Allergic/Immunologic: Positive for environmental allergies. Negative for food allergies.  Neurological:  Negative for dizziness, tremors, seizures, syncope, facial asymmetry, speech difficulty, weakness, light-headedness and numbness.  Hematological:  Negative for adenopathy. Does not bruise/bleed easily.  Psychiatric/Behavioral:  Negative for agitation, confusion and sleep disturbance.        Objective:   Physical Exam Vitals and nursing note reviewed.  Constitutional:      General: She is awake. She is not in acute distress.    Appearance: Normal appearance. She is well-developed, well-groomed and overweight. She is not  ill-appearing, toxic-appearing or diaphoretic.  HENT:     Head: Normocephalic and atraumatic.     Jaw: There is normal jaw occlusion. No trismus.     Salivary Glands: Right salivary gland is not diffusely enlarged. Left salivary gland is not diffusely enlarged.     Comments: Very mild erythema oropharynx tonsils 0-1+/4 mild erythema lateral edges; bilateral allergic shiners; some nasal sniffing/congestion observed in clinic; wearing disposable surgical mask as she is still unsure if she has covid     Right Ear: Hearing, ear Salazar and external ear normal. A middle ear effusion is present.     Left Ear: Hearing, ear Salazar and external ear normal. A middle ear effusion is present.     Nose: Mucosal edema, congestion and rhinorrhea present. No nasal deformity, septal deviation or laceration. Rhinorrhea is clear.     Right Turbinates: Enlarged and swollen. Not pale.     Left Turbinates: Enlarged and swollen. Not pale.     Right Sinus: Maxillary sinus tenderness and frontal sinus tenderness present.     Left Sinus: Maxillary sinus tenderness and frontal sinus tenderness present.     Mouth/Throat:     Lips: Pink. No lesions.     Mouth: Mucous membranes are moist. Mucous membranes are not pale, not dry and not cyanotic. No lacerations, oral lesions or angioedema.     Dentition: No dental abscesses or gum lesions.     Tongue: No lesions. Tongue does not deviate from midline.     Palate: No mass and lesions.     Pharynx: Uvula midline. Pharyngeal swelling and posterior oropharyngeal erythema present. No oropharyngeal exudate or uvula swelling.     Tonsils: No tonsillar exudate or tonsillar abscesses. 1+ on the  right. 1+ on the left.  Eyes:     General: Lids are normal. Vision grossly intact. Gaze aligned appropriately. Allergic shiner present. No scleral icterus.       Right eye: No foreign body, discharge or hordeolum.        Left eye: No foreign body, discharge or hordeolum.     Extraocular  Movements: Extraocular movements intact.     Right eye: Normal extraocular motion and no nystagmus.     Left eye: Normal extraocular motion and no nystagmus.     Conjunctiva/sclera: Conjunctivae normal.     Right eye: Right conjunctiva is not injected. No chemosis, exudate or hemorrhage.    Left eye: Left conjunctiva is not injected. No chemosis, exudate or hemorrhage.    Pupils: Pupils are equal, round, and reactive to light. Pupils are equal.     Right eye: Pupil is round and reactive.     Left eye: Pupil is round and reactive.  Neck:     Thyroid: No thyroid mass or thyromegaly.     Trachea: Trachea and phonation normal. No tracheal tenderness or tracheal deviation.  Cardiovascular:     Rate and Rhythm: Normal rate and regular rhythm.     Pulses: Normal pulses.          Radial pulses are 2+ on the right side and 2+ on the left side.     Heart sounds: Normal heart sounds, S1 normal and S2 normal.  Pulmonary:     Effort: Pulmonary effort is normal. No accessory muscle usage or respiratory distress.     Breath sounds: Normal breath sounds and air entry. No stridor. No decreased breath sounds, wheezing, rhonchi or rales.     Comments: No cough observed in clinic; no throat clearing observed; spoke full sentences without difficulty Chest:     Chest wall: No tenderness.  Abdominal:     General: There is no distension.     Palpations: Abdomen is soft.  Musculoskeletal:        General: No tenderness. Normal range of motion.     Right shoulder: No swelling or laceration. Normal strength.     Left shoulder: No swelling or laceration. Normal strength.     Right elbow: No swelling, deformity, effusion or lacerations. Normal range of motion.     Left elbow: No swelling, deformity, effusion or lacerations. Normal range of motion.     Right hand: Normal strength. Normal capillary refill.     Left hand: Normal strength. Normal capillary refill.     Cervical back: Normal range of motion and neck  supple. No swelling, edema, deformity, erythema, signs of trauma, lacerations, rigidity, torticollis, tenderness or crepitus. No pain with movement or spinous process tenderness. Normal range of motion.     Thoracic back: No swelling, edema, deformity, signs of trauma, lacerations, spasms or tenderness.     Right hip: Normal.     Left hip: Normal.     Right knee: Normal.     Left knee: Normal.  Lymphadenopathy:     Head:     Right side of head: No submental, submandibular, tonsillar, preauricular, posterior auricular or occipital adenopathy.     Left side of head: No submental, submandibular, tonsillar, preauricular, posterior auricular or occipital adenopathy.     Cervical: No cervical adenopathy.     Right cervical: No superficial, deep or posterior cervical adenopathy.    Left cervical: No superficial, deep or posterior cervical adenopathy.  Skin:    General: Skin is  warm and dry.     Capillary Refill: Capillary refill takes less than 2 seconds.     Coloration: Skin is not ashen, cyanotic, jaundiced, mottled, pale or sallow.     Findings: No abrasion, abscess, acne, bruising, burn, ecchymosis, erythema, signs of injury, laceration, lesion, petechiae, rash or wound.     Nails: There is no clubbing.  Neurological:     General: No focal deficit present.     Mental Status: She is alert and oriented to person, place, and time. She is not disoriented.     GCS: GCS eye subscore is 4. GCS verbal subscore is 5. GCS motor subscore is 6.     Cranial Nerves: Cranial nerves 2-12 are intact. No cranial nerve deficit, dysarthria or facial asymmetry.     Sensory: Sensation is intact. No sensory deficit.     Motor: Motor function is intact. No weakness, tremor, atrophy, abnormal muscle tone or seizure activity.     Coordination: Coordination is intact. Coordination normal.     Gait: Gait is intact. Gait normal.     Comments: Bilateral hand grasp equal 5/5; in/out of chair without difficulty; gait sure  and stead in clinic  Psychiatric:        Attention and Perception: Attention and perception normal.        Mood and Affect: Mood and affect normal.        Speech: Speech normal.        Behavior: Behavior normal. Behavior is cooperative.        Thought Content: Thought content normal.        Cognition and Memory: Cognition and memory normal.        Judgment: Judgment normal.           Assessment & Plan:   A-acute pharyngitis  P-most likely post nasal drip but if worsening symptoms/ new fever greater than 100.5 retest for covid with home test.  Free [place] govt home test given to patient.  Discussed may use OTC cepacol troches for sore throat also.  Consider dayquil/nyquil OTC to help dry up drip if viral rhinitis.  Flonase nasal 65mg 1 spray each nostril BID.  Nasal saline 2 sprays each nostril q2h wa prn congestion/thick mucous.  Patient denied tenacious mucous.  Hydrate with water as easy to get dehydrated with rhinitis/congestion/mouth breathing.  Continue mask wear at work when around others to help prevent spread of virus.  Discussed multiple different viruses circulating in community e.g. covid/flu/non-covid flu (testing negative for both and allergies ragweed/fungal at this time causing rhinitis.   Usually no specific medical treatment is needed if a virus is causing the sore throat. The throat most often gets better on its own within 5 to 7 days. Antibiotic medicine does not cure viral pharyngitis.  -For acute pharyngitis caused by bacteria, your healthcare provider will prescribe an antibiotic.  -Motrin/Ibuprofen '800mg'$  by mouth every 8 hours with food or naproxen/naprosyn/aleve '500mg'$  by mouth every 12 hours with food prn pain/fever and/or Tylenol '1000mg'$  by mouth every 6 hours as needed for pain or fever OTC ER/call 911 if drooling, unable to swallow, trouble breathing discussed tonsillar abscess symptoms/hot potato voice. -Do not smoke.  -Avoid secondhand smoke and other air pollutants.   -Use a cool mist humidifier to add moisture to the air.  -Get plenty of rest; sleep 7-8 hours per night -You may want to rest your throat by talking less and eating a diet that is mostly liquid or soft for a day  or two.  -Nonprescription throat lozenges and mouthwashes should help relieve the soreness.  -Gargling with warm saltwater and drinking warm liquids may help. (You can make a saltwater solution by adding 1/4 teaspoon of salt to 8 ounces, or 240 mL, of warm water.)  -Change your toothbrush on your last day of antibiotic -Avoid oral intimate contact until antibiotics finished -Wash dishes/silverware/glasses in hot water or diswasher -Do not share glasses/silverware/dishes during meals that touch your lips/mouth -Usually no specific medical treatment is needed if a virus is causing the sore throat. The throat most often gets better on its own within 5 to 7 days. Antibiotic medicine does not cure viral pharyngitis. DDx covid, viral URI, mononucleosis, strep throat, hand foot and mouth, post nasal drip irritation pharynx/throat -Exitcare handout on pharyngitis acute and strep throat  FOLLOW UP with clinic provider if no improvements/worsening in the next 7-10 days. Patient verbalized understanding of instructions and agreed with plan of care.  P2: Hand washing and diet.    Elevated blood pressure will repeat after illness resolved/pain/illness most likely causing elevation.  Patient only had 1 serving caffeine today.  Discussed avoid further servings caffeine this afternoon/evening.  ER if chest pain/visual changes/palpitations/worst headache of her life.  Patient verbalized understanding information/instructions, agreed with plan of care and had no further questions at this time.

## 2021-10-12 NOTE — Patient Instructions (Signed)

## 2021-10-13 DIAGNOSIS — Z23 Encounter for immunization: Secondary | ICD-10-CM | POA: Insufficient documentation

## 2021-10-13 DIAGNOSIS — L680 Hirsutism: Secondary | ICD-10-CM | POA: Insufficient documentation

## 2021-10-13 DIAGNOSIS — D490 Neoplasm of unspecified behavior of digestive system: Secondary | ICD-10-CM | POA: Insufficient documentation

## 2021-10-13 DIAGNOSIS — L709 Acne, unspecified: Secondary | ICD-10-CM | POA: Insufficient documentation

## 2021-10-15 ENCOUNTER — Telehealth: Payer: Self-pay | Admitting: Registered Nurse

## 2021-10-15 ENCOUNTER — Encounter: Payer: Self-pay | Admitting: Registered Nurse

## 2021-10-15 DIAGNOSIS — J029 Acute pharyngitis, unspecified: Secondary | ICD-10-CM

## 2021-10-15 NOTE — Telephone Encounter (Signed)
Spoke with patient in her office/workcenter.  Stated sore throat resolved this am.  Feeling well denied concerns.  Denied rhinitis/n/v/d/cough/fever or chills.

## 2021-12-30 ENCOUNTER — Encounter: Payer: Self-pay | Admitting: *Deleted

## 2022-02-04 ENCOUNTER — Other Ambulatory Visit (HOSPITAL_BASED_OUTPATIENT_CLINIC_OR_DEPARTMENT_OTHER): Payer: Self-pay

## 2022-02-04 MED ORDER — COVID-19 MRNA 2023-2024 VACCINE (COMIRNATY) 0.3 ML INJECTION
0.3000 mL | Freq: Once | INTRAMUSCULAR | 0 refills | Status: AC
  Filled 2022-02-04: qty 0.3, 1d supply, fill #0

## 2022-02-15 ENCOUNTER — Encounter: Payer: Self-pay | Admitting: Bariatrics

## 2022-02-15 ENCOUNTER — Ambulatory Visit: Payer: No Typology Code available for payment source | Admitting: Bariatrics

## 2022-02-15 VITALS — BP 113/74 | HR 85 | Temp 97.8°F | Ht 64.0 in | Wt 178.0 lb

## 2022-02-15 DIAGNOSIS — E669 Obesity, unspecified: Secondary | ICD-10-CM

## 2022-02-15 DIAGNOSIS — Z6830 Body mass index (BMI) 30.0-30.9, adult: Secondary | ICD-10-CM

## 2022-02-15 DIAGNOSIS — E559 Vitamin D deficiency, unspecified: Secondary | ICD-10-CM | POA: Diagnosis not present

## 2022-02-15 DIAGNOSIS — Z6828 Body mass index (BMI) 28.0-28.9, adult: Secondary | ICD-10-CM | POA: Insufficient documentation

## 2022-02-15 DIAGNOSIS — R5383 Other fatigue: Secondary | ICD-10-CM | POA: Insufficient documentation

## 2022-02-15 DIAGNOSIS — R0683 Snoring: Secondary | ICD-10-CM | POA: Diagnosis not present

## 2022-02-15 DIAGNOSIS — Z0289 Encounter for other administrative examinations: Secondary | ICD-10-CM

## 2022-02-21 ENCOUNTER — Encounter: Payer: Self-pay | Admitting: Registered Nurse

## 2022-02-21 ENCOUNTER — Telehealth: Payer: Self-pay | Admitting: Registered Nurse

## 2022-02-21 DIAGNOSIS — J029 Acute pharyngitis, unspecified: Secondary | ICD-10-CM

## 2022-02-21 NOTE — Telephone Encounter (Signed)
Patient seen by RN Evlyn Kanner today complained of fatigue sore throat has not performed home covid test.  Contacted patient and recommended home covid test before return to work onsite tomorrow.  Contact clinic staff if new or worsening symptoms.  Patient A&Ox3 spoke full sentences without difficulty resting at home at this time.  Denied concerns or further questions and will do her test in am before coming to work.  No audible cough/throat clearing/wheezing/nasal congestion during 2 minute call.  Patient verbalized understanding information/instructions, agreed with plan of care and had no further questions at this time.

## 2022-02-22 ENCOUNTER — Other Ambulatory Visit: Payer: Self-pay | Admitting: Occupational Medicine

## 2022-02-22 ENCOUNTER — Ambulatory Visit: Payer: Self-pay | Admitting: Registered Nurse

## 2022-02-22 ENCOUNTER — Encounter: Payer: Self-pay | Admitting: Registered Nurse

## 2022-02-22 VITALS — BP 130/86 | HR 89 | Temp 98.0°F

## 2022-02-22 DIAGNOSIS — J069 Acute upper respiratory infection, unspecified: Secondary | ICD-10-CM

## 2022-02-22 DIAGNOSIS — H6993 Unspecified Eustachian tube disorder, bilateral: Secondary | ICD-10-CM

## 2022-02-22 NOTE — Progress Notes (Signed)
Subjective:    Patient ID: Haley Salazar, female    DOB: 27-Nov-1995, 27 y.o.   MRN: [id number]  26y/o established female here for evaluation bilateral ear pain, sore throat, fatigue, cough typically at night.  Denied fever/chills/n/v/d.  Home covid test negative this am.  Denied known sick contacts.  Post nasal drip noted.  Typically no winter allergies.      Review of Systems  Constitutional:  Positive for fatigue. Negative for activity change, appetite change, chills, diaphoresis and fever.  HENT:  Positive for ear pain, postnasal drip and sore throat. Negative for congestion, ear discharge, facial swelling, hearing loss, mouth sores, nosebleeds, rhinorrhea, sinus pressure, sinus pain, sneezing, tinnitus, trouble swallowing and voice change.   Eyes:  Negative for photophobia and visual disturbance.  Respiratory:  Positive for cough. Negative for choking, chest tightness, shortness of breath, wheezing and stridor.   Cardiovascular:  Negative for chest pain.  Gastrointestinal:  Negative for diarrhea, nausea and vomiting.  Genitourinary:  Negative for difficulty urinating.  Musculoskeletal:  Negative for neck pain and neck stiffness.  Skin:  Negative for rash.  Neurological:  Negative for dizziness, tremors, seizures, syncope, facial asymmetry, speech difficulty, weakness, light-headedness, numbness and headaches.  Hematological:  Negative for adenopathy. Does not bruise/bleed easily.  Psychiatric/Behavioral:  Negative for agitation, confusion and sleep disturbance.        Objective:   Physical Exam Vitals and nursing note reviewed.  Constitutional:      General: She is awake. She is not in acute distress.    Appearance: Normal appearance. She is well-developed, well-groomed and normal weight. She is not ill-appearing, toxic-appearing or diaphoretic.  HENT:     Head: Normocephalic and atraumatic.     Jaw: There is normal jaw occlusion. No trismus.     Salivary Glands: Right  salivary gland is not diffusely enlarged or tender. Left salivary gland is not diffusely enlarged or tender.     Right Ear: Hearing, ear Salazar and external ear normal. No decreased hearing noted. No laceration, drainage, swelling or tenderness. A middle ear effusion is present. There is no impacted cerumen. No foreign body. No mastoid tenderness. No PE tube. No hemotympanum. Tympanic membrane is bulging. Tympanic membrane is not injected, scarred, perforated, erythematous or retracted.     Left Ear: Hearing, ear Salazar and external ear normal. No decreased hearing noted. No laceration, drainage, swelling or tenderness. A middle ear effusion is present. There is no impacted cerumen. No foreign body. No mastoid tenderness. No PE tube. No hemotympanum. Tympanic membrane is bulging. Tympanic membrane is not injected, scarred, perforated, erythematous or retracted.     Ears:     Comments: Bilateral TMs intact bulging air fluid level clear; bilateral allergic shiners; cobblestoning posterior pharynx    Nose: Mucosal edema and rhinorrhea present. No nasal deformity, septal deviation, laceration, nasal tenderness or congestion. Rhinorrhea is clear.     Right Nostril: No epistaxis or occlusion.     Left Nostril: No epistaxis or occlusion.     Right Turbinates: Not enlarged, swollen or pale.     Left Turbinates: Not enlarged, swollen or pale.     Right Sinus: No maxillary sinus tenderness or frontal sinus tenderness.     Left Sinus: No maxillary sinus tenderness or frontal sinus tenderness.     Mouth/Throat:     Lips: Pink. No lesions.     Mouth: Mucous membranes are moist. Mucous membranes are not pale, not dry and not cyanotic. No lacerations, oral lesions  or angioedema.     Dentition: No dental abscesses or gum lesions.     Tongue: No lesions. Tongue does not deviate from midline.     Palate: No mass and lesions.     Pharynx: Uvula midline. Pharyngeal swelling and posterior oropharyngeal erythema present.  No oropharyngeal exudate or uvula swelling.     Tonsils: No tonsillar exudate or tonsillar abscesses.  Eyes:     General: Lids are normal. Vision grossly intact. Gaze aligned appropriately. Allergic shiner present. No scleral icterus.       Right eye: No foreign body, discharge or hordeolum.        Left eye: No foreign body, discharge or hordeolum.     Extraocular Movements: Extraocular movements intact.     Right eye: Normal extraocular motion and no nystagmus.     Left eye: Normal extraocular motion and no nystagmus.     Conjunctiva/sclera: Conjunctivae normal.     Right eye: Right conjunctiva is not injected. No chemosis, exudate or hemorrhage.    Left eye: Left conjunctiva is not injected. No chemosis, exudate or hemorrhage.    Pupils: Pupils are equal, round, and reactive to light. Pupils are equal.     Right eye: Pupil is round and reactive.     Left eye: Pupil is round and reactive.  Neck:     Thyroid: No thyroid mass or thyromegaly.     Trachea: Trachea and phonation normal. No tracheal tenderness or tracheal deviation.  Cardiovascular:     Rate and Rhythm: Normal rate and regular rhythm.     Pulses:          Radial pulses are 2+ on the right side and 2+ on the left side.     Heart sounds: Normal heart sounds, S1 normal and S2 normal.  Pulmonary:     Effort: Pulmonary effort is normal. No accessory muscle usage or respiratory distress.     Breath sounds: Normal breath sounds and air entry. No stridor, decreased air movement or transmitted upper airway sounds. No decreased breath sounds, wheezing, rhonchi or rales.     Comments: Spoke full sentences without difficulty; no cough observed in clinic Chest:     Chest wall: No tenderness.  Abdominal:     General: There is no distension.     Palpations: Abdomen is soft.  Musculoskeletal:        General: No tenderness. Normal range of motion.     Right hand: Normal strength. Normal capillary refill.     Left hand: Normal strength.  Normal capillary refill.     Cervical back: Normal range of motion and neck supple. No swelling, edema, deformity, erythema, signs of trauma, lacerations, rigidity, spasms, torticollis, tenderness or crepitus. Normal range of motion.     Thoracic back: No swelling, edema, deformity, signs of trauma, lacerations, spasms or tenderness. Normal range of motion.     Right hip: Normal.     Left hip: Normal.     Right knee: Normal.     Left knee: Normal.  Lymphadenopathy:     Head:     Right side of head: No submental, submandibular, tonsillar, preauricular, posterior auricular or occipital adenopathy.     Left side of head: No submental, submandibular, tonsillar, preauricular, posterior auricular or occipital adenopathy.     Cervical: No cervical adenopathy.     Right cervical: No superficial, deep or posterior cervical adenopathy.    Left cervical: No superficial, deep or posterior cervical adenopathy.  Skin:  General: Skin is warm and dry.     Capillary Refill: Capillary refill takes less than 2 seconds.     Coloration: Skin is not ashen, cyanotic, jaundiced, mottled, pale or sallow.     Findings: No abrasion, abscess, acne, bruising, burn, ecchymosis, erythema, signs of injury, laceration, lesion, petechiae, rash or wound.     Nails: There is no clubbing.  Neurological:     General: No focal deficit present.     Mental Status: She is alert and oriented to person, place, and time. Mental status is at baseline. She is not disoriented.     GCS: GCS eye subscore is 4. GCS verbal subscore is 5. GCS motor subscore is 6.     Cranial Nerves: Cranial nerves 2-12 are intact. No cranial nerve deficit, dysarthria or facial asymmetry.     Sensory: No sensory deficit.     Motor: Motor function is intact. No weakness, tremor, atrophy, abnormal muscle tone or seizure activity.     Coordination: Coordination is intact. Coordination normal.     Gait: Gait is intact. Gait normal.     Comments: In/out of  chair without difficulty; gait sure and steady in clinic; bilateral hand grasp equal 5/5  Psychiatric:        Attention and Perception: Attention and perception normal.        Mood and Affect: Mood and affect normal.        Speech: Speech normal.        Behavior: Behavior normal. Behavior is cooperative.        Thought Content: Thought content normal.        Cognition and Memory: Cognition and memory normal.        Judgment: Judgment normal.           Assessment & Plan:   A-viral URI with cough, eustachian tube dysfunction bilateral  P-Patient may use normal saline nasal spray 2 sprays each nostril q2h wa as needed. flonase 26mg 1 spray each nostril BID OTC.  Phenylephrine '10mg'$  po q6h prn rhinitis given 4 UD from clinic stock.  Discussed if no relief with these show driver's license at pharmacy for sudafed '30mg'$  1-2 tabs po q4-6h prn runny nose max '240mg'$ /8 tabs in 24 hours. Patient denied personal or family history of ENT cancer.  OTC antihistamine of choice claritin/zyrtec '10mg'$  po daily.  Avoid triggers if possible.  Shower prior to bedtime Call or return to clinic as needed if these symptoms worsen or fail to improve as anticipated.   Exitcare handout on viral URI.  Patient verbalized understanding of instructions, agreed with plan of care and had no further questions at this time.    No evidence of invasive bacterial infection, non toxic and well hydrated.  I do not see where any further testing or imaging is necessary at this time.   I will suggest supportive care, rest, good hygiene and encourage the patient to take adequate fluids.  The patient is to return to clinic or EMERGENCY ROOM if symptoms worsen or change significantly e.g. ear pain, fever, purulent discharge from ears or bleeding.  Exitcare handout on eustachian tube dysfunction.  Discussed with patient post nasal drip irritates throat/causes swelling blocks eustachian tubes from draining and fluid fills up middle ear.   Bacteria/viruses can grow in fluid and with moving head tube compressed and increases pressure in tube/ear worsening pain.  Studies show will take 30 days for fluid to resolve after post nasal drip controlled with nasal steroid/antihistamine. Antibiotics  and steroids do not speed up fluid removal.  May use tylenol or ibuprofen OTC po prn pain.  Patient verbalized agreement and understanding of treatment plan and had no further questions at this time.  P2:  Avoidance and hand washing.

## 2022-02-22 NOTE — Patient Instructions (Signed)
Eustachian Tube Dysfunction  Eustachian tube dysfunction refers to a condition in which a blockage develops in the narrow passage that connects the middle ear to the back of the nose (eustachian tube). The eustachian tube regulates air pressure in the middle ear by letting air move between the ear and nose. It also helps to drain fluid from the middle ear space. Eustachian tube dysfunction can affect one or both ears. When the eustachian tube does not function properly, air pressure, fluid, or both can build up in the middle ear. What are the causes? This condition occurs when the eustachian tube becomes blocked or cannot open normally. Common causes of this condition include: Ear infections. Colds and other infections that affect the nose, mouth, and throat (upper respiratory tract). Allergies. Irritation from cigarette smoke. Irritation from stomach acid coming up into the esophagus (gastroesophageal reflux). The esophagus is the part of the body that moves food from the mouth to the stomach. Sudden changes in air pressure, such as from descending in an airplane or scuba diving. Abnormal growths in the nose or throat, such as: Growths that line the nose (nasal polyps). Abnormal growth of cells (tumors). Enlarged tissue at the back of the throat (adenoids). What increases the risk? You are more likely to develop this condition if: You smoke. You are overweight. You are a child who has: Certain birth defects of the mouth, such as cleft palate. Large tonsils or adenoids. What are the signs or symptoms? Common symptoms of this condition include: A feeling of fullness in the ear. Ear pain. Clicking or popping noises in the ear. Ringing in the ear (tinnitus). Hearing loss. Loss of balance. Dizziness. Symptoms may get worse when the air pressure around you changes, such as when you travel to an area of high elevation, fly on an airplane, or go scuba diving. How is this diagnosed? This  condition may be diagnosed based on: Your symptoms. A physical exam of your ears, nose, and throat. Tests, such as those that measure: The movement of your eardrum. Your hearing (audiometry). How is this treated? Treatment depends on the cause and severity of your condition. In mild cases, you may relieve your symptoms by moving air into your ears. This is called "popping the ears." In more severe cases, or if you have symptoms of fluid in your ears, treatment may include: Medicines to relieve congestion (decongestants). Medicines that treat allergies (antihistamines). Nasal sprays or ear drops that contain medicines that reduce swelling (steroids). A procedure to drain the fluid in your eardrum. In this procedure, a small tube may be placed in the eardrum to: Drain the fluid. Restore the air in the middle ear space. A procedure to insert a balloon device through the nose to inflate the opening of the eustachian tube (balloon dilation). Follow these instructions at home: Lifestyle Do not do any of the following until your health care provider approves: Travel to high altitudes. Fly in airplanes. Work in a pressurized cabin or room. Scuba dive. Do not use any products that contain nicotine or tobacco. These products include cigarettes, chewing tobacco, and vaping devices, such as e-cigarettes. If you need help quitting, ask your health care provider. Keep your ears dry. Wear fitted earplugs during showering and bathing. Dry your ears completely after. General instructions Take over-the-counter and prescription medicines only as told by your health care provider. Use techniques to help pop your ears as recommended by your health care provider. These may include: Chewing gum. Yawning. Frequent, forceful swallowing.   Closing your mouth, holding your nose closed, and gently blowing as if you are trying to blow air out of your nose. Keep all follow-up visits. This is important. Contact a  health care provider if: Your symptoms do not go away after treatment. Your symptoms come back after treatment. You are unable to pop your ears. You have: A fever. Pain in your ear. Pain in your head or neck. Fluid draining from your ear. Your hearing suddenly changes. You become very dizzy. You lose your balance. Get help right away if: You have a sudden, severe increase in any of your symptoms. Summary Eustachian tube dysfunction refers to a condition in which a blockage develops in the eustachian tube. It can be caused by ear infections, allergies, inhaled irritants, or abnormal growths in the nose or throat. Symptoms may include ear pain or fullness, hearing loss, or ringing in the ears. Mild cases are treated with techniques to unblock the ears, such as yawning or chewing gum. More severe cases are treated with medicines or procedures. This information is not intended to replace advice given to you by your health care provider. Make sure you discuss any questions you have with your health care provider. Document Revised: 03/16/2020 Document Reviewed: 03/16/2020 Elsevier Patient Education  [place]. Viral Respiratory Infection A respiratory infection is an illness that affects part of the respiratory system, such as the lungs, nose, or throat. A respiratory infection that is caused by a virus is called a viral respiratory infection. Common types of viral respiratory infections include: A cold. The flu (influenza). A respiratory syncytial virus (RSV) infection. What are the causes? This condition is caused by a virus. The virus may spread through contact with droplets or direct contact with infected people or their mucus or secretions. The virus may spread from person to person (is contagious). What are the signs or symptoms? Symptoms of this condition include: A stuffy or runny nose. A sore throat or cough. Shortness of breath or difficulty breathing. Yellow or green  mucus (sputum). Other symptoms may include: A fever. Sweating or chills. Fatigue. Achy muscles. A headache. How is this diagnosed? This condition may be diagnosed based on: Your symptoms. A physical exam. Testing of secretions from the nose or throat. Chest X-ray. How is this treated? This condition may be treated with medicines, such as: Antiviral medicine. This may shorten the length of time a person has symptoms. Expectorants. These make it easier to cough up mucus. Decongestant nasal sprays. Acetaminophen or NSAIDs, such as ibuprofen, to relieve fever and pain. Antibiotic medicines are not prescribed for viral infections.This is because antibiotics are designed to kill bacteria. They do not kill viruses. Follow these instructions at home: Managing pain and congestion Take over-the-counter and prescription medicines only as told by your health care provider. If you have a sore throat, gargle with a mixture of salt and water 3-4 times a day or as needed. To make salt water, completely dissolve -1 tsp (3-6 g) of salt in 1 cup (237 mL) of warm water. Use nose drops made from salt water to ease congestion and soften raw skin around your nose. Take 2 tsp (10 mL) of honey at bedtime to lessen coughing at night. Do not give honey to children who are younger than 1 year. Drink enough fluid to keep your urine pale yellow. This helps prevent dehydration and helps loosen up mucus. General instructions  Rest as much as possible. Do not drink alcohol. Do not use any products  that contain nicotine or tobacco. These products include cigarettes, chewing tobacco, and vaping devices, such as e-cigarettes. If you need help quitting, ask your health care provider. Keep all follow-up visits. This is important. How is this prevented?     Get an annual flu shot. You may get the flu shot in late summer, fall, or winter. Ask your health care provider when you should get your flu shot. Avoid  spreading your infection to other people. If you are sick: Wash your hands with soap and water often, especially after you cough or sneeze. Wash for at least 20 seconds. If soap and water are not available, use alcohol-based hand sanitizer. Cover your mouth when you cough. Cover your nose and mouth when you sneeze. Do not share cups or eating utensils. Clean commonly used objects often. Clean commonly touched surfaces. Stay home from work or school as told by your health care provider. Avoid contact with people who are sick during cold and flu season. This is generally fall and winter. Contact a health care provider if: Your symptoms last for 10 days or longer. Your symptoms get worse over time. You have severe sinus pain in your face or forehead. The glands in your jaw or neck become very swollen. You have shortness of breath. Get help right away if you: Feel pain or pressure in your chest. Have trouble breathing. Faint or feel like you will faint. Have severe and persistent vomiting. Feel confused or disoriented. These symptoms may represent a serious problem that is an emergency. Do not wait to see if the symptoms will go away. Get medical help right away. Call your local emergency services (911 in the [place] Do not drive yourself to the hospital. Summary A respiratory infection is an illness that affects part of the respiratory system, such as the lungs, nose, or throat. A respiratory infection that is caused by a virus is called a viral respiratory infection. Common types of viral respiratory infections include a cold, influenza, and respiratory syncytial virus (RSV) infection. Symptoms of this condition include a stuffy or runny nose, cough, fatigue, achy muscles, sore throat, and fevers or chills. Antibiotic medicines are not prescribed for viral infections. This is because antibiotics are designed to kill bacteria. They are not effective against viruses. This information is not  intended to replace advice given to you by your health care provider. Make sure you discuss any questions you have with your health care provider. Document Revised: 04/09/2020 Document Reviewed: 04/09/2020 Elsevier Patient Education  [place].

## 2022-02-22 NOTE — Progress Notes (Unsigned)
Office: [phone]  /  Fax: [phone]   Initial Visit  Haley Salazar was seen in clinic today to evaluate for obesity. She is interested in losing weight to improve overall health and reduce the risk of weight related complications. She presents today to review program treatment options, initial physical assessment, and evaluation.     She was referred by: Self-Referral.  Coworker.  When asked what else they would like to accomplish? She states: Adopt healthier eating patterns and Improve quality of life  When asked how has your weight affected you? 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  Peak Weight:178 lbs ,Visceral Fat Rating:5, Body Fat%:35.3, Weight trend over the last 12 months: Decreasing  General:  Alert, oriented and cooperative. Patient is in no acute distress.  Respiratory: Normal respiratory effort, no problems with respiration noted  Extremities: Normal range of motion.    Mental Status: Normal mood and affect. Normal behavior. Normal judgment and thought content.    Assessment and Plan:  1. Other fatigue Has fatigue with certain activities.  Plan: 1.  Will do EKG and indirect calorimetry at next visit. 2.  Thyroid panel, B12, vitamin D, CBC at next visit. - EKG 12-Lead; Future  2. Snoring Apnea?  No family history of sleep apnea.  Plan: 1.  Work on weight loss.  3. Vitamin D deficiency Has taken high-dose vitamin D.  Plan: Will check vitamin D.  4. Generalized obesity 5. BMI 30.0-30.9,adult Return to office in 2 weeks or earlier for EKG and indirect calorimetry.  We reviewed weight, biometrics, associated medical conditions and contributing factors with patient. She would benefit from weight loss therapy via a modified calorie, low-carb, high-protein nutritional plan tailored to their REE (resting energy expenditure) which will be determined by indirect calorimetry.  We will also assess for cardiometabolic risk and nutritional derangements via fasting serologies at her next appointment.      Obesity Treatment / Action Plan:  Patient will work on garnering support from family and friends to begin weight loss journey. Will work on eliminating or reducing the presence of highly palatable, calorie dense foods in the home. Will be scheduled for indirect calorimetry to determine resting energy expenditure in a fasting state.  This will allow [place] to create a reduced calorie, high-protein meal plan to promote loss of fat mass while preserving muscle mass. Was counseled on pharmacotherapy and role as an adjunct in weight management.   Obesity Education Performed Today:  She was weighed on the bioimpedance scale and results were discussed and documented  in the synopsis.  We discussed obesity as a disease and the importance of a more detailed evaluation of all the factors contributing to the disease.  We discussed the importance of long term lifestyle changes which include nutrition, exercise and behavioral modifications as well as the  importance of customizing this to her specific health and social needs.  We discussed the benefits of reaching a healthier weight to alleviate the symptoms of existing conditions and reduce the risks of the biomechanical, metabolic and psychological effects of obesity.  Haley Salazar appears to be in the action stage of change and states they are ready to start intensive lifestyle modifications and behavioral modifications.  30 minutes was spent today on this visit including the above counseling, pre-visit chart review, and post-visit documentation.  Reviewed by clinician on day of visit: allergies, medications, problem list, medical history, surgical history, family history, social history, and previous encounter notes.   I, Dawn Whitmire, FNP-C, am acting as transcriptionist for Dr. Jearld Lesch.   Time spent on visit including pre-visit chart review and post-visit charting and care was 30 minutes.     I have reviewed the above documentation for accuracy and completeness, and I agree with the above. Jearld Lesch, DO

## 2022-02-22 NOTE — Telephone Encounter (Signed)
Patient feeling better today see office visit note home covid test negative.

## 2022-02-23 ENCOUNTER — Ambulatory Visit (INDEPENDENT_AMBULATORY_CARE_PROVIDER_SITE_OTHER): Payer: No Typology Code available for payment source | Admitting: Bariatrics

## 2022-02-23 ENCOUNTER — Encounter: Payer: Self-pay | Admitting: Bariatrics

## 2022-02-23 VITALS — BP 122/83 | HR 57 | Temp 98.3°F | Ht 64.0 in | Wt 179.0 lb

## 2022-02-23 DIAGNOSIS — R7309 Other abnormal glucose: Secondary | ICD-10-CM

## 2022-02-23 DIAGNOSIS — D649 Anemia, unspecified: Secondary | ICD-10-CM

## 2022-02-23 DIAGNOSIS — Z1331 Encounter for screening for depression: Secondary | ICD-10-CM | POA: Diagnosis not present

## 2022-02-23 DIAGNOSIS — E7800 Pure hypercholesterolemia, unspecified: Secondary | ICD-10-CM

## 2022-02-23 DIAGNOSIS — R0602 Shortness of breath: Secondary | ICD-10-CM

## 2022-02-23 DIAGNOSIS — E669 Obesity, unspecified: Secondary | ICD-10-CM

## 2022-02-23 DIAGNOSIS — R5383 Other fatigue: Secondary | ICD-10-CM

## 2022-02-23 DIAGNOSIS — Z6830 Body mass index (BMI) 30.0-30.9, adult: Secondary | ICD-10-CM

## 2022-02-23 DIAGNOSIS — Z0000 Encounter for general adult medical examination without abnormal findings: Secondary | ICD-10-CM | POA: Insufficient documentation

## 2022-02-23 DIAGNOSIS — E559 Vitamin D deficiency, unspecified: Secondary | ICD-10-CM

## 2022-02-24 LAB — HEMOGLOBIN A1C
Est. average glucose Bld gHb Est-mCnc: 105 mg/dL
Hgb A1c MFr Bld: 5.3 % (ref 4.8–5.6)

## 2022-02-24 LAB — COMPREHENSIVE METABOLIC PANEL
ALT: 10 IU/L (ref 0–32)
AST: 13 IU/L (ref 0–40)
Albumin/Globulin Ratio: 1.6 (ref 1.2–2.2)
Albumin: 4.7 g/dL (ref 4.0–5.0)
Alkaline Phosphatase: 62 IU/L (ref 44–121)
BUN/Creatinine Ratio: 12 (ref 9–23)
BUN: 9 mg/dL (ref 6–20)
Bilirubin Total: 0.3 mg/dL (ref 0.0–1.2)
CO2: 20 mmol/L (ref 20–29)
Calcium: 9.9 mg/dL (ref 8.7–10.2)
Chloride: 104 mmol/L (ref 96–106)
Creatinine, Ser: 0.77 mg/dL (ref 0.57–1.00)
Globulin, Total: 3 g/dL (ref 1.5–4.5)
Glucose: 80 mg/dL (ref 70–99)
Potassium: 4.6 mmol/L (ref 3.5–5.2)
Sodium: 141 mmol/L (ref 134–144)
Total Protein: 7.7 g/dL (ref 6.0–8.5)
eGFR: 110 mL/min/{1.73_m2} (ref >59)

## 2022-02-24 LAB — ANEMIA PANEL
Ferritin: 87 ng/mL (ref 15–150)
Folate, Hemolysate: 337 ng/mL
Folate, RBC: 851 ng/mL (ref >498)
Hematocrit: 39.6 % (ref 34.0–46.6)
Iron Saturation: 25 % (ref 15–55)
Iron: 105 ug/dL (ref 27–159)
Retic Ct Pct: 1.1 % (ref 0.6–2.6)
Total Iron Binding Capacity: 422 ug/dL (ref 250–450)
UIBC: 317 ug/dL (ref 131–425)
Vitamin B-12: 230 pg/mL — ABNORMAL LOW (ref 232–1245)

## 2022-02-24 LAB — LIPID PANEL WITH LDL/HDL RATIO
Cholesterol, Total: 182 mg/dL (ref 100–199)
HDL: 53 mg/dL (ref >39)
LDL Chol Calc (NIH): 99 mg/dL (ref 0–99)
LDL/HDL Ratio: 1.9 ratio (ref 0.0–3.2)
Triglycerides: 177 mg/dL — ABNORMAL HIGH (ref 0–149)
VLDL Cholesterol Cal: 30 mg/dL (ref 5–40)

## 2022-02-24 LAB — TSH+T4F+T3FREE
Free T4: 1.35 ng/dL (ref 0.82–1.77)
T3, Free: 3.7 pg/mL (ref 2.0–4.4)
TSH: 1.21 u[IU]/mL (ref 0.450–4.500)

## 2022-02-24 LAB — INSULIN, RANDOM: INSULIN: 13.7 u[IU]/mL (ref 2.6–24.9)

## 2022-02-24 LAB — VITAMIN D 25 HYDROXY (VIT D DEFICIENCY, FRACTURES): Vit D, 25-Hydroxy: 29 ng/mL — ABNORMAL LOW (ref 30.0–100.0)

## 2022-03-01 ENCOUNTER — Telehealth: Payer: Self-pay

## 2022-03-01 ENCOUNTER — Encounter (INDEPENDENT_AMBULATORY_CARE_PROVIDER_SITE_OTHER): Payer: Self-pay | Admitting: Bariatrics

## 2022-03-01 DIAGNOSIS — E781 Pure hyperglyceridemia: Secondary | ICD-10-CM | POA: Insufficient documentation

## 2022-03-01 DIAGNOSIS — E538 Deficiency of other specified B group vitamins: Secondary | ICD-10-CM | POA: Insufficient documentation

## 2022-03-01 NOTE — Telephone Encounter (Signed)
-----   Message from Georgia Lopes, DO sent at 03/01/2022 11:49 AM EST ----- Call pt. Her B 12 is too low. I want her to get some B 12 over-the-counter at 1,000 mcg ( micrograms ) daily. We will recheck in the future.

## 2022-03-01 NOTE — Telephone Encounter (Signed)
Left message for patient to return call. Will also send a MyChart message.

## 2022-03-03 ENCOUNTER — Ambulatory Visit: Payer: No Typology Code available for payment source | Admitting: Bariatrics

## 2022-03-09 ENCOUNTER — Ambulatory Visit: Payer: No Typology Code available for payment source | Admitting: Bariatrics

## 2022-03-09 ENCOUNTER — Encounter: Payer: Self-pay | Admitting: Bariatrics

## 2022-03-09 VITALS — BP 110/76 | HR 83 | Temp 98.2°F | Ht 64.0 in | Wt 178.0 lb

## 2022-03-09 DIAGNOSIS — Z6830 Body mass index (BMI) 30.0-30.9, adult: Secondary | ICD-10-CM

## 2022-03-09 DIAGNOSIS — E781 Pure hyperglyceridemia: Secondary | ICD-10-CM

## 2022-03-09 DIAGNOSIS — D649 Anemia, unspecified: Secondary | ICD-10-CM | POA: Diagnosis not present

## 2022-03-09 DIAGNOSIS — E669 Obesity, unspecified: Secondary | ICD-10-CM

## 2022-03-09 DIAGNOSIS — E559 Vitamin D deficiency, unspecified: Secondary | ICD-10-CM

## 2022-03-09 DIAGNOSIS — F5089 Other specified eating disorder: Secondary | ICD-10-CM

## 2022-03-09 DIAGNOSIS — E538 Deficiency of other specified B group vitamins: Secondary | ICD-10-CM

## 2022-03-09 DIAGNOSIS — E88819 Insulin resistance, unspecified: Secondary | ICD-10-CM | POA: Insufficient documentation

## 2022-03-09 DIAGNOSIS — F509 Eating disorder, unspecified: Secondary | ICD-10-CM | POA: Insufficient documentation

## 2022-03-09 MED ORDER — VITAMIN D (ERGOCALCIFEROL) 1.25 MG (50000 UNIT) PO CAPS: 50000.0000 [IU] | ORAL_CAPSULE | ORAL | 0 refills | Status: DC
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
## 2022-03-14 NOTE — Progress Notes (Unsigned)
Chief Complaint:   OBESITY Haley Salazar (MR# [id number]) is a 27 y.o. female who presents for evaluation and treatment of obesity and related comorbidities. Current BMI is Body mass index is 30.73 kg/m. Haley Salazar has been struggling with her weight for many years and has been unsuccessful in either losing weight, maintaining weight loss, or reaching her healthy weight goal.  Haley Salazar is here for her initial visit. She was seen for an information session on 02/15/2022.  Shaconda is currently in the action stage of change and ready to dedicate time achieving and maintaining a healthier weight. Haley Salazar is interested in becoming our patient and working on intensive lifestyle modifications including (but not limited to) diet and exercise for weight loss.  Haley Salazar's habits were reviewed today and are as follows: Her family eats meals together, she thinks her family will eat healthier with her, her desired weight loss is 24 lbs, she has been heavy most of her life, she started gaining weight in September 2021, her heaviest weight ever was 197 pounds, she is a picky eater and doesn't like to eat healthier foods, she has significant food cravings issues, she skips meals frequently, she is frequently drinking liquids with calories, she frequently makes poor food choices, she has problems with excessive hunger, she frequently eats larger portions than normal, she has binge eating behaviors, and she struggles with emotional eating.  Depression Screen Haley Salazar's Food and Mood (modified PHQ-9) score was 23.  Subjective:   1. Other fatigue Haley Salazar admits to daytime somnolence and admits to waking up still tired. Patient has a history of symptoms of daytime fatigue, morning fatigue, and morning headache. Haley Salazar generally gets 8+ hours of sleep per night, and states that she has nightime awakenings. Snoring is present. Apneic episodes are present. Epworth Sleepiness Score is 7.   2. SOB  (shortness of breath) on exertion Haley Salazar notes increasing shortness of breath with exercising and seems to be worsening over time with weight gain. She notes getting out of breath sooner with activity than she used to. This has not gotten worse recently. Haley Salazar denies shortness of breath at rest or orthopnea.  3. Elevated cholesterol Haley Salazar is not on medications. Her last lipid panel on 03/22/2021, showed triglycerides 182 and LDL 110.   4. Health care maintenance Given obesity.   5. Vitamin D deficiency Haley Salazar is taking multivitamins.   6. Elevated glucose Haley Salazar's mother has pre-diabetes.   7. Anemia, unspecified type Haley Salazar has had anemia in the past.   Assessment/Plan:   1. Other fatigue Haley Salazar does feel that her weight is causing her energy to be lower than it should be. Fatigue may be related to obesity, depression or many other causes. Labs will be ordered, and in the meanwhile, Haley Salazar will focus on self care including making healthy food choices, increasing physical activity and focusing on stress reduction.  - EKG 12-Lead - TSH+T4F+T3Free  2. SOB (shortness of breath) on exertion Haley Salazar does feel that she gets out of breath more easily that she used to when she exercises. Haley Salazar's shortness of breath appears to be obesity related and exercise induced. She has agreed to work on weight loss and gradually increase exercise to treat her exercise induced shortness of breath. Will continue to monitor closely.  - TSH+T4F+T3Free  3. Elevated cholesterol We will check labs today, and we will follow-up at her next visit.   - Lipid Panel With LDL/HDL Ratio  4. Health care maintenance We will check labs today.  EKG and IC were done today, and results were reviewed with the patient today.   - VITAMIN D 25 Hydroxy (Vit-D Deficiency, Fractures) - TSH+T4F+T3Free - Lipid Panel With LDL/HDL Ratio - Insulin, random - Hemoglobin A1c - Comprehensive metabolic panel -  Anemia panel  5. Vitamin D deficiency Haley Salazar will continue her multivitamins.   6. Elevated glucose We will check labs today, and we will follow-up at her next visit.   - Insulin, random - Hemoglobin A1c  7. Anemia, unspecified type We will check labs today, and we will follow-up at her next visit.   - Anemia panel  8. Depression screening Haley Salazar had a positive depression screening. Depression is commonly associated with obesity and often results in emotional eating behaviors. We will monitor this closely and work on CBT to help improve the non-hunger eating patterns. Referral to Psychology may be required if no improvement is seen as she continues in our clinic.  9. Generalized obesity  10. BMI 30.0-30.9,adult Haley Salazar is currently in the action stage of change and her goal is to continue with weight loss efforts. I recommend Haley Salazar begin the structured treatment plan as follows:  She has agreed to the Category 3 Plan.  Meal planning was discussed. Reviewed labs from 03/22/2021, CMP, LDL, lipid and lipid panel. Will minimize meal skipping. Discussed meal plan in detail. Eating Out sheet was provided.   Exercise goals: No exercise has been prescribed at this time.   Behavioral modification strategies: increasing lean protein intake, decreasing simple carbohydrates, increasing vegetables, increasing water intake, decreasing eating out, no skipping meals, meal planning and cooking strategies, keeping healthy foods in the home, and planning for success.  She was informed of the importance of frequent follow-up visits to maximize her success with intensive lifestyle modifications for her multiple health conditions. She was informed we would discuss her lab results at her next visit unless there is a critical issue that needs to be addressed sooner. Haley Salazar agreed to keep her next visit at the agreed upon time to discuss these results.  Objective:   Blood pressure 122/83, pulse  (!) 57, temperature 98.3 F (36.8 C), height '5\' 4"'$  (1.626 m), weight 179 lb (81.2 kg), SpO2 99 %. Body mass index is 30.73 kg/m.  EKG: Normal sinus rhythm, rate 69 BPM.  Indirect Calorimeter completed today shows a VO2 of 275 and a REE of 1901.  Her calculated basal metabolic rate is [id number] thus her basal metabolic rate is better than expected.  General: Cooperative, alert, well developed, in no acute distress. HEENT: Conjunctivae and lids unremarkable. Cardiovascular: Regular rhythm.  Lungs: Normal work of breathing. Neurologic: No focal deficits.   Lab Results  Component Value Date   CREATININE 0.77 02/23/2022   BUN 9 02/23/2022   NA 141 02/23/2022   K 4.6 02/23/2022   CL 104 02/23/2022   CO2 20 02/23/2022   Lab Results  Component Value Date   ALT 10 02/23/2022   AST 13 02/23/2022   GGT 9 03/22/2021   ALKPHOS 62 02/23/2022   BILITOT 0.3 02/23/2022   Lab Results  Component Value Date   HGBA1C 5.3 02/23/2022   HGBA1C 5.5 03/22/2021   HGBA1C 5.7 06/22/2020   Lab Results  Component Value Date   INSULIN 13.7 02/23/2022   Lab Results  Component Value Date   TSH 1.210 02/23/2022   Lab Results  Component Value Date   CHOL 182 02/23/2022   HDL 53 02/23/2022   LDLCALC 99 02/23/2022   LDLDIRECT  122.0 06/22/2020   TRIG 177 (H) 02/23/2022   CHOLHDL 4.4 03/22/2021   Lab Results  Component Value Date   WBC 7.2 03/22/2021   HGB 13.3 03/22/2021   HCT 39.6 02/23/2022   MCV 89 03/22/2021   PLT 294 03/22/2021   Lab Results  Component Value Date   IRON 105 02/23/2022   TIBC 422 02/23/2022   FERRITIN 87 02/23/2022   Attestation Statements:   Reviewed by clinician on day of visit: allergies, medications, problem list, medical history, surgical history, family history, social history, and previous encounter notes.   Wilhemena Durie, am acting as Location manager for [employer], DO.  I have reviewed the above documentation for accuracy and completeness, and I agree  with the above. Jearld Lesch, DO

## 2022-03-15 ENCOUNTER — Telehealth (INDEPENDENT_AMBULATORY_CARE_PROVIDER_SITE_OTHER): Payer: No Typology Code available for payment source | Admitting: Psychology

## 2022-03-15 ENCOUNTER — Encounter: Payer: Self-pay | Admitting: Bariatrics

## 2022-03-15 DIAGNOSIS — F419 Anxiety disorder, unspecified: Secondary | ICD-10-CM | POA: Diagnosis not present

## 2022-03-15 DIAGNOSIS — F909 Attention-deficit hyperactivity disorder, unspecified type: Secondary | ICD-10-CM

## 2022-03-15 DIAGNOSIS — F32A Depression, unspecified: Secondary | ICD-10-CM

## 2022-03-15 DIAGNOSIS — F5089 Other specified eating disorder: Secondary | ICD-10-CM

## 2022-03-17 ENCOUNTER — Ambulatory Visit: Payer: No Typology Code available for payment source | Admitting: Bariatrics

## 2022-03-22 NOTE — Progress Notes (Unsigned)
Chief Complaint:   Haley Salazar is here to discuss her progress with her obesity treatment plan along with follow-up of her obesity related diagnoses. Haley Salazar is on the Category 3 Plan and states she is following her eating plan approximately 75% of the time. Haley Salazar states she is at the gym for 60 minutes 3-4 times per week.  Today's visit was #: 2 Starting weight: 179 lbs Starting date: 02/23/2022 Today's weight: 178 lbs Today's date: 03/09/2022 Total lbs lost to date: 1 Total lbs lost since last in-office visit: 1  Interim History: Haley Salazar is down 1 lb since her first visit. She states that it was ok to follow the plan. She states breakfast was hard and also dinner. She is doing well with her water intake.   Subjective:   1. Vitamin D insufficiency Haley Salazar is taking multivitamins with iron. Recent Vitamin D level was 29.0.  2. B12 deficiency Haley Salazar bought B12 supplementation at 1,000 mcg. Recent B12 level was 230.  3. Hypertriglyceridemia Haley Salazar's triglycerides were 177.  4. Anemia, unspecified type Haley Salazar is currently stable. Reviewed last CBC and anemia panel.   5. Insulin resistance Haley Salazar's recent insulin level was 13.7.  6. Other disorder of eating Haley Salazar see a therapist regularly. She notes "avoidance of goal" and self-sabotage.   Assessment/Plan:   1. Vitamin D insufficiency Haley Salazar agreed to start prescription Vitamin D 50,000 IU once weekly with no refills.   - Vitamin D, Ergocalciferol, (DRISDOL) 1.25 MG (50000 UNIT) CAPS capsule; Take 1 capsule (50,000 Units total) by mouth every 7 (seven) days.  Dispense: 5 capsule; Refill: 0  2. B12 deficiency Haley Salazar will take OTC B12 1,000 mcg daily.   3. Hypertriglyceridemia Haley Salazar will work on decreasing carbohydrates. She will take OTC Omega 3 fish oil. DHA/EPA.  4. Anemia, unspecified type Haley Salazar will continue to follow-up with her PCP to monitor.   5. Insulin resistance The goal  is at or below 10, ideal level at 5.  6. Other disorder of eating Haley Salazar was referred to Dr. Mallie Mussel, our Bariatric Psychologist, for evaluation.  7. Generalized obesity  8. BMI 30.0-30.9,adult Haley Salazar is currently in the action stage of change. As such, her goal is to continue with weight loss efforts. She has agreed to the Category 3 Plan.   Meal planning was discussed. Will adhere to the plan 80-90%. Reviewed labs from 02/23/2022, CMP, Lipitor, iron/anemia panel, Vit D, B12, A1c, and insulin.   Exercise goals: As is.   Behavioral modification strategies: increasing lean protein intake, decreasing simple carbohydrates, increasing vegetables, increasing water intake, decreasing eating out, no skipping meals, meal planning and cooking strategies, keeping healthy foods in the home, and planning for success.  Haley Salazar has agreed to follow-up with our clinic in 2 weeks with Everardo Pacific, FNP-C. She was informed of the importance of frequent follow-up visits to maximize her success with intensive lifestyle modifications for her multiple health conditions.   Objective:   Blood pressure 110/76, pulse 83, temperature 98.2 F (36.8 C), height '5\' 4"'$  (1.626 m), weight 178 lb (80.7 kg), SpO2 100 %. Body mass index is 30.55 kg/m.  General: Cooperative, alert, well developed, in no acute distress. HEENT: Conjunctivae and lids unremarkable. Cardiovascular: Regular rhythm.  Lungs: Normal work of breathing. Neurologic: No focal deficits.   Lab Results  Component Value Date   CREATININE 0.77 02/23/2022   BUN 9 02/23/2022   NA 141 02/23/2022   K 4.6 02/23/2022   CL 104 02/23/2022   CO2 20  02/23/2022   Lab Results  Component Value Date   ALT 10 02/23/2022   AST 13 02/23/2022   GGT 9 03/22/2021   ALKPHOS 62 02/23/2022   BILITOT 0.3 02/23/2022   Lab Results  Component Value Date   HGBA1C 5.3 02/23/2022   HGBA1C 5.5 03/22/2021   HGBA1C 5.7 06/22/2020   Lab Results  Component  Value Date   INSULIN 13.7 02/23/2022   Lab Results  Component Value Date   TSH 1.210 02/23/2022   Lab Results  Component Value Date   CHOL 182 02/23/2022   HDL 53 02/23/2022   LDLCALC 99 02/23/2022   LDLDIRECT 122.0 06/22/2020   TRIG 177 (H) 02/23/2022   CHOLHDL 4.4 03/22/2021   Lab Results  Component Value Date   VD25OH 29.0 (L) 02/23/2022   VD25OH 17.6 (L) 03/22/2021   Lab Results  Component Value Date   WBC 7.2 03/22/2021   HGB 13.3 03/22/2021   HCT 39.6 02/23/2022   MCV 89 03/22/2021   PLT 294 03/22/2021   Lab Results  Component Value Date   IRON 105 02/23/2022   TIBC 422 02/23/2022   FERRITIN 87 02/23/2022   Attestation Statements:   Reviewed by clinician on day of visit: allergies, medications, problem list, medical history, surgical history, family history, social history, and previous encounter notes.   Wilhemena Durie, am acting as Location manager for [employer], DO.  I have reviewed the above documentation for accuracy and completeness, and I agree with the above. Jearld Lesch, DO

## 2022-03-23 ENCOUNTER — Encounter: Payer: Self-pay | Admitting: Bariatrics

## 2022-03-23 ENCOUNTER — Ambulatory Visit: Payer: No Typology Code available for payment source | Admitting: Bariatrics

## 2022-03-23 VITALS — BP 131/80 | HR 74 | Temp 98.0°F | Ht 64.0 in | Wt 180.0 lb

## 2022-03-23 DIAGNOSIS — R632 Polyphagia: Secondary | ICD-10-CM | POA: Diagnosis not present

## 2022-03-23 DIAGNOSIS — E669 Obesity, unspecified: Secondary | ICD-10-CM | POA: Diagnosis not present

## 2022-03-23 DIAGNOSIS — Z6830 Body mass index (BMI) 30.0-30.9, adult: Secondary | ICD-10-CM | POA: Diagnosis not present

## 2022-03-23 DIAGNOSIS — E559 Vitamin D deficiency, unspecified: Secondary | ICD-10-CM | POA: Diagnosis not present

## 2022-03-23 MED ORDER — VITAMIN D (ERGOCALCIFEROL) 1.25 MG (50000 UNIT) PO CAPS: 50000.0000 [IU] | ORAL_CAPSULE | ORAL | 0 refills | Status: DC

## 2022-03-24 ENCOUNTER — Encounter: Payer: Self-pay | Admitting: Bariatrics

## 2022-03-24 ENCOUNTER — Telehealth: Payer: Self-pay

## 2022-03-24 ENCOUNTER — Other Ambulatory Visit (INDEPENDENT_AMBULATORY_CARE_PROVIDER_SITE_OTHER): Payer: Self-pay | Admitting: Bariatrics

## 2022-03-24 ENCOUNTER — Telehealth (INDEPENDENT_AMBULATORY_CARE_PROVIDER_SITE_OTHER): Payer: Self-pay | Admitting: Bariatrics

## 2022-03-24 MED ORDER — WEGOVY 0.25 MG/0.5ML ~~LOC~~ SOAJ: 0.2500 mg | SUBCUTANEOUS | 0 refills | Status: DC

## 2022-03-24 NOTE — Telephone Encounter (Signed)
Started prior auth for [employer] 0.'25mg'$ 

## 2022-03-24 NOTE — Telephone Encounter (Signed)
3/7 Patient is calling in to let Dr. Owens Shark know thats her insurance ([employer]) on covers [place]. JE

## 2022-03-24 NOTE — Telephone Encounter (Signed)
Per insureance, COST EXCEEDS MAXIMUM; Notified patient of Covermymeds message. Patient verbalized understanding and stated she will call her benefits and call the office back.

## 2022-03-28 NOTE — Telephone Encounter (Signed)
Resent PA for [facility] for patient.

## 2022-03-28 NOTE — Telephone Encounter (Signed)
[employer] 0.25 approved 03/28/22-10/28/22.

## 2022-03-29 ENCOUNTER — Telehealth (INDEPENDENT_AMBULATORY_CARE_PROVIDER_SITE_OTHER): Payer: No Typology Code available for payment source | Admitting: Psychology

## 2022-03-29 DIAGNOSIS — F5089 Other specified eating disorder: Secondary | ICD-10-CM | POA: Diagnosis not present

## 2022-03-29 DIAGNOSIS — F32A Depression, unspecified: Secondary | ICD-10-CM | POA: Diagnosis not present

## 2022-03-29 DIAGNOSIS — F419 Anxiety disorder, unspecified: Secondary | ICD-10-CM

## 2022-03-29 DIAGNOSIS — F909 Attention-deficit hyperactivity disorder, unspecified type: Secondary | ICD-10-CM

## 2022-03-29 NOTE — Progress Notes (Signed)
  Office: [phone]  /  Fax: [phone]    Date: March 29, 2022    Appointment Start Time: 11:31am Duration: 28 minutes Provider: Glennie Isle, Psy.D. Type of Session: Individual Therapy  Location of Patient: Work (private location) Location of Provider: Provider's Home (private office) Type of Contact: Telepsychological Visit via MyChart Video Visit  Session Content: Haley Salazar is a 27 y.o. female presenting for a follow-up appointment to address the previously established treatment goal of increasing coping skills.Today's appointment was a telepsychological visit. Haley Salazar provided verbal consent for today's telepsychological appointment and she is aware she is responsible for securing confidentiality on her end of the session. Prior to proceeding with today's appointment, Love's physical location at the time of this appointment was obtained as well a phone number she could be reached at in the event of technical difficulties. Haley Salazar and this provider participated in today's telepsychological service.   This provider conducted a brief check-in. Haley Salazar stated, "Work has been really stressful," adding she is off the rest of the week to spend time with her mother. A risk assessment was completed. Haley Salazar denied experiencing suicidal, self-harm and homicidal ideation, plan, and intent since the last appointment with this provider. She continues to acknowledge understanding regarding the importance of reaching out to trusted individuals and/or emergency resources if she is unable to ensure safety. She described future plans that include ongoing efforts to improve her well-being and eating habits. Moreover, Haley Salazar indicated she continues to struggle with after dinner snacking. Haley Salazar also reported challenges with specific foods on the structured meal plan. She was engaged in problem solving. Furthermore, psychoeducation provided regarding the importance of eating regularly and consuming  protein. Overall, Haley Salazar was receptive to today's appointment as evidenced by openness to sharing, responsiveness to feedback, and willingness to implement discussed strategies .  Mental Status Examination:  Appearance: neat Behavior: appropriate to circumstances Mood: neutral Affect: mood congruent Speech: WNL Eye Contact: appropriate Psychomotor Activity: WNL Gait: unable to assess Thought Process: linear, logical, and goal directed and denies suicidal, homicidal, and self-harm ideation, plan and intent  Thought Content/Perception: no hallucinations, delusions, bizarre thinking or behavior endorsed or observed Orientation: AAOx4 Memory/Concentration: memory, attention, language, and fund of knowledge intact  Insight: fair Judgment: fair  Interventions:  Conducted a brief chart review Conducted a risk assessment Provided empathic reflections and validation Provided positive reinforcement Employed supportive psychotherapy interventions to facilitate reduced distress and to improve coping skills with identified stressors Engaged patient in problem solving  DSM-5 Diagnosis(es):  F50.89 Other Specified Feeding or Eating Disorder, Emotional Eating Behaviors, F90.9 Unspecified Attention-Deficit/Hyperactivity Disorder , F41.9 Unspecified Anxiety Disorder, and  F32.A Unspecified Depressive Disorder  Treatment Goal & Progress: During the initial appointment with this provider, the following treatment goal was established: increase coping skills. Progress is limited, as Haley Salazar has just begun treatment with this provider; however, she is receptive to the interaction and interventions and rapport is being established.   Plan: The next appointment is scheduled for 04/12/2022 at 11am, which will be via MyChart Video Visit. The next session will focus on working towards the established treatment goal. Haley Salazar noted she is in the  process of scheduling an ADHD evaluation with [facility]. Additionally, Haley Salazar will continue with her primary therapist and psychiatric provider.

## 2022-04-05 NOTE — Progress Notes (Signed)
Chief Complaint:   Haley Salazar is here to discuss her progress with her obesity treatment plan along with follow-up of her obesity related diagnoses. [name] is on the Category 3 plan and states she is following her eating plan approximately 50% of the time. [name] states she is going to the gym for 60 minutes 4 times per week.  Today's visit was #: 3 Starting weight: 179 lbs Starting date: 02/23/22 Today's weight: 180 lbs Today's date: 03/23/22 Total lbs lost to date: 0 Total lbs lost since last in-office visit: +2  Interim History: She is up 2 pounds since her last visit.  She states things have been bad overall with her routine and schedule.    Subjective:   1. Vitamin D insufficiency Taking vitamin D.  2. Polyphagia "Not eating enough". Increased hunger but a picky eater.  Assessment/Plan:   1. Vitamin D insufficiency Refill- - Vitamin D, Ergocalciferol, (DRISDOL) 1.25 MG (50000 UNIT) CAPS capsule; Take 1 capsule (50,000 Units total) by mouth every 7 (seven) days.  Dispense: 5 capsule; Refill: 0  2. Polyphagia 1.  Will eat cauliflower and chicken. 2.  Increase protein. 3.  Information on Qsymia/GLP-1.  Will call insurance company and call us back.  3. Generalized obesity BMI 30.0-30.9,adult 1.  Meal planning. 2.  Intentional eating. 3.  [place] or [employer] for lunch.  Haley Salazar is currently in the action stage of change. As such, her goal is to continue with weight loss efforts. She has agreed to the Category 3 plan.  Exercise goals:  as is  Behavioral modification strategies: increasing lean protein intake, decreasing simple carbohydrates, increasing vegetables, increasing water intake, decreasing eating out, no skipping meals, meal planning and cooking strategies, keeping healthy foods in the home, and planning for success.  [name] has agreed to follow-up with our clinic in 2 weeks with [facility]. She was informed of the importance of frequent  follow-up visits to maximize her success with intensive lifestyle modifications for her multiple health conditions.    Objective:   Blood pressure 131/80, pulse 74, temperature 98 F (36.7 C), height 5\' 4"  (1.626 m), weight 180 lb (81.6 kg), SpO2 98 %. Body mass index is 30.9 kg/m.  General: Cooperative, alert, well developed, in no acute distress. HEENT: Conjunctivae and lids unremarkable. Cardiovascular: Regular rhythm.  Lungs: Normal work of breathing. Neurologic: No focal deficits.   Lab Results  Component Value Date   CREATININE 0.77 02/23/2022   BUN 9 02/23/2022   NA 141 02/23/2022   K 4.6 02/23/2022   CL 104 02/23/2022   CO2 20 02/23/2022   Lab Results  Component Value Date   ALT 10 02/23/2022   AST 13 02/23/2022   GGT 9 03/22/2021   ALKPHOS 62 02/23/2022   BILITOT 0.3 02/23/2022   Lab Results  Component Value Date   HGBA1C 5.3 02/23/2022   HGBA1C 5.5 03/22/2021   HGBA1C 5.7 06/22/2020   Lab Results  Component Value Date   INSULIN 13.7 02/23/2022   Lab Results  Component Value Date   TSH 1.210 02/23/2022   Lab Results  Component Value Date   CHOL 182 02/23/2022   HDL 53 02/23/2022   LDLCALC 99 02/23/2022   LDLDIRECT 122.0 06/22/2020   TRIG 177 (H) 02/23/2022   CHOLHDL 4.4 03/22/2021   Lab Results  Component Value Date   VD25OH 29.0 (L) 02/23/2022   VD25OH 17.6 (L) 03/22/2021   Lab Results  Component Value Date   WBC 7.2 03/22/2021  HGB 13.3 03/22/2021   HCT 39.6 02/23/2022   MCV 89 03/22/2021   PLT 294 03/22/2021   Lab Results  Component Value Date   IRON 105 02/23/2022   TIBC 422 02/23/2022   FERRITIN 87 02/23/2022    Attestation Statements:   Reviewed by clinician on day of visit: allergies, medications, problem list, medical history, surgical history, family history, social history, and previous encounter notes.  I, Dawn Whitmire, FNP-C, am acting as transcriptionist for Dr. Jearld Lesch.  I have reviewed the above  documentation for accuracy and completeness, and I agree with the above. Jearld Lesch, DO

## 2022-04-06 ENCOUNTER — Ambulatory Visit: Payer: No Typology Code available for payment source | Admitting: Nurse Practitioner

## 2022-04-06 ENCOUNTER — Encounter: Payer: Self-pay | Admitting: Nurse Practitioner

## 2022-04-06 VITALS — BP 114/78 | HR 86 | Temp 98.1°F | Ht 64.0 in | Wt 174.0 lb

## 2022-04-06 DIAGNOSIS — E559 Vitamin D deficiency, unspecified: Secondary | ICD-10-CM

## 2022-04-06 DIAGNOSIS — E669 Obesity, unspecified: Secondary | ICD-10-CM

## 2022-04-06 DIAGNOSIS — Z6829 Body mass index (BMI) 29.0-29.9, adult: Secondary | ICD-10-CM

## 2022-04-06 MED ORDER — VITAMIN D (ERGOCALCIFEROL) 1.25 MG (50000 UNIT) PO CAPS: 50000.0000 [IU] | ORAL_CAPSULE | ORAL | 0 refills | Status: DC

## 2022-04-06 NOTE — Progress Notes (Signed)
Office: [phone]  /  Fax: [phone]  WEIGHT SUMMARY AND BIOMETRICS  Weight Lost Since Last Visit: 6lb  No data recorded  Vitals Temp: 98.1 F (36.7 C) BP: 114/78 Pulse Rate: 86 SpO2: 96 %   Anthropometric Measurements Height: 5\' 4"  (1.626 m) Weight: 174 lb (78.9 kg) BMI (Calculated): 29.85 Weight at Last Visit: 180lb Weight Lost Since Last Visit: 6lb Starting Weight: 179lb Total Weight Loss (lbs): 5 lb (2.268 kg)   Body Composition  Body Fat %: 35.9 % Fat Mass (lbs): 62.8 lbs Muscle Mass (lbs): 106.4 lbs Total Body Water (lbs): 76.4 lbs Visceral Fat Rating : 5   Other Clinical Data Today's Visit #: 4 Starting Date: 02/23/22     HPI  Chief Complaint: OBESITY  Haley Salazar is here to discuss her progress with her obesity treatment plan. She is on the the Category 3 Plan and states she is following her eating plan approximately 70 % of the time. She states she is exercising 60 minutes 3 days per week-Just cancelled her membership with [employer] and plans to start going to [employer].   Interval History:  Since last office visit she has lost 6 pounds. She has been "better about my protein and ok with vegetables".  She is averaging around 60-70 grams of protein.  She finds she is getting full faster since starting Wegovy.  She is drinking a protein shake and water daily.     Pharmacotherapy for weight loss: She is currently taking Wegovy 0.25mg  (today was 2nd dose) for medical weight loss ([place] approved until 10/28/22).  Denies side effects.    Previous pharmacotherapy for medical weight loss:   never     Bariatric surgery:  Patient never had bariatric surgery.    Vit D deficiency  She is taking Vit D 50,000 IU weekly.  Denies side effects.  Denies nausea, vomiting or muscle weakness.    Lab Results  Component Value Date   VD25OH 29.0 (L) 02/23/2022   VD25OH 17.6 (L) 03/22/2021    PHYSICAL EXAM:  Blood pressure 114/78, pulse 86, temperature  98.1 F (36.7 C), height 5\' 4"  (1.626 m), weight 174 lb (78.9 kg), SpO2 96 %. Body mass index is 29.87 kg/m.  General: She is overweight, cooperative, alert, well developed, and in no acute distress. PSYCH: Has normal mood, affect and thought process.   Extremities: No edema.  Neurologic: No gross sensory or motor deficits. No tremors or fasciculations noted.    DIAGNOSTIC DATA REVIEWED:  BMET    Component Value Date/Time   NA 141 02/23/2022 0952   K 4.6 02/23/2022 0952   CL 104 02/23/2022 0952   CO2 20 02/23/2022 0952   GLUCOSE 80 02/23/2022 0952   GLUCOSE 96 06/22/2020 1357   BUN 9 02/23/2022 0952   CREATININE 0.77 02/23/2022 0952   CREATININE 0.67 10/29/2019 0910   CALCIUM 9.9 02/23/2022 0952   GFRNONAA 124 10/29/2019 0910   GFRAA 144 10/29/2019 0910   Lab Results  Component Value Date   HGBA1C 5.3 02/23/2022   HGBA1C 5.7 06/22/2020   Lab Results  Component Value Date   INSULIN 13.7 02/23/2022   Lab Results  Component Value Date   TSH 1.210 02/23/2022   CBC    Component Value Date/Time   WBC 7.2 03/22/2021 0930   WBC 9.4 06/22/2020 1357   RBC 4.56 03/22/2021 0930   RBC 4.41 06/22/2020 1357   HGB 13.3 03/22/2021 0930   HCT 39.6 02/23/2022 0952   PLT  294 03/22/2021 0930   MCV 89 03/22/2021 0930   MCH 29.2 03/22/2021 0930   MCH 29.2 10/29/2019 0910   MCHC 32.8 03/22/2021 0930   MCHC 33.7 06/22/2020 1357   RDW 12.1 03/22/2021 0930   Iron Studies    Component Value Date/Time   IRON 105 02/23/2022 0952   TIBC 422 02/23/2022 0952   FERRITIN 87 02/23/2022 0952   IRONPCTSAT 25 02/23/2022 0952   Lipid Panel     Component Value Date/Time   CHOL 182 02/23/2022 0952   TRIG 177 (H) 02/23/2022 0952   HDL 53 02/23/2022 0952   CHOLHDL 4.4 03/22/2021 0930   CHOLHDL 4 06/22/2020 1357   VLDL 40.2 (H) 06/22/2020 1357   LDLCALC 99 02/23/2022 0952   LDLCALC 141 (H) 10/29/2019 0910   LDLDIRECT 122.0 06/22/2020 1357   Hepatic Function Panel     Component  Value Date/Time   PROT 7.7 02/23/2022 0952   ALBUMIN 4.7 02/23/2022 0952   AST 13 02/23/2022 0952   ALT 10 02/23/2022 0952   ALKPHOS 62 02/23/2022 0952   BILITOT 0.3 02/23/2022 0952      Component Value Date/Time   TSH 1.210 02/23/2022 0952   Nutritional Lab Results  Component Value Date   VD25OH 29.0 (L) 02/23/2022   VD25OH 17.6 (L) 03/22/2021     ASSESSMENT AND PLAN  TREATMENT PLAN FOR OBESITY:  Recommended Dietary Goals  Haley Salazar is currently in the action stage of change. As such, her goal is to continue weight management plan. She has agreed to the Category 3 Plan.  Behavioral Intervention  We discussed the following Behavioral Modification Strategies today: increasing lean protein intake, decreasing simple carbohydrates , increasing vegetables, avoiding skipping meals, increasing water intake, work on meal planning and easy cooking plans, planning for success, and keeping healthy foods at home.  Additional resources provided today: NA  Recommended Physical Activity Goals  Haley Salazar has been advised to work up to 150 minutes of moderate intensity aerobic activity a week and strengthening exercises 2-3 times per week for cardiovascular health, weight loss maintenance and preservation of muscle mass.   She has agreed to Continue current level of physical activity    Pharmacotherapy We discussed various medication options to help Haley Salazar with her weight loss efforts and we both agreed to continue Wegovy 0.25mg .  ASSOCIATED CONDITIONS ADDRESSED TODAY  Action/Plan  Vitamin D insufficiency -     Vitamin D (Ergocalciferol); Take 1 capsule (50,000 Units total) by mouth every 7 (seven) days.  Dispense: 5 capsule; Refill: 0. Side effects discussed.   Low Vitamin D level contributes to fatigue and are associated with obesity, breast, and colon cancer. She agrees to continue to take prescription Vitamin D @50 ,000 IU every week and will follow-up for routine testing of  Vitamin D, at least 2-3 times per year to avoid over-replacement.   Generalized obesity  BMI 29.0-29.9,adult      Return in about 2 weeks (around 04/20/2022).Marland Kitchen She was informed of the importance of frequent follow up visits to maximize her success with intensive lifestyle modifications for her multiple health conditions.   ATTESTASTION STATEMENTS:  Reviewed by clinician on day of visit: allergies, medications, problem list, medical history, surgical history, family history, social history, and previous encounter notes.     Ailene Rud. Khia Dieterich FNP-C

## 2022-04-07 ENCOUNTER — Encounter: Payer: Self-pay | Admitting: Registered Nurse

## 2022-04-07 ENCOUNTER — Telehealth: Payer: Self-pay | Admitting: Registered Nurse

## 2022-04-07 DIAGNOSIS — E538 Deficiency of other specified B group vitamins: Secondary | ICD-10-CM

## 2022-04-07 DIAGNOSIS — E559 Vitamin D deficiency, unspecified: Secondary | ICD-10-CM

## 2022-04-07 MED ORDER — VITAMIN B-12 1000 MCG PO TABS: 1000.0000 ug | ORAL_TABLET | Freq: Every day | ORAL | Status: AC

## 2022-04-07 NOTE — Telephone Encounter (Signed)
[]   Cholesterol, Total mg/dL 182 100 mg/dL 199   []  Triglycerides mg/dL 177 H 0 mg/dL 149   []  HDL Cholesterol mg/dL 53 39 mg/dL   []  VLDL Cholesterol Cal mg/dL 30 5 mg/dL 40   []  LDL Chol Calc (NIH) mg/dL 99 0 mg/dL 99   []  LDL/HDL Ratio ratio 1.9 0 ratio 3     Glucose mg/dL 80 70 mg/dL 99    []  BUN mg/dL 9 6 mg/dL 20   []  Creatinine mg/dL 0.77 0.57 mg/dL 1   []  eGFR mL/min/1.73 110 59 mL/min/1.73   []  BUN/Creatinine Ratio 12 9 23    []  Sodium mmol/L 141 134 mmol/L 144   []  Potassium mmol/L 4.6 3.5 mmol/L 5.2   []  Chloride mmol/L 104 96 mmol/L 106   []  Carbon Dioxide, Total mmol/L 20 20 mmol/L 29   []  Calcium mg/dL 9.9 8.7 mg/dL 10.2   []  Protein, Total g/dL 7.7 6 g/dL 8.5   []  Albumin g/dL 4.7 4 g/dL 5   []  Globulin, Total g/dL 3 1.5 g/dL 4.5   []  A/G Ratio 1.6 1.2 2.2   []  Bilirubin, Total mg/dL 0.3 0 mg/dL 1.2   []  Alkaline Phosphatase IU/L 62 44 IU/L 121   []  AST (SGOT) IU/L 13 0 IU/L 40   []  ALT (SGPT) IU/L 10 0 IU/L 32    Clear Selections         []  Hemoglobin A1c % 5.3 4.8 % 5.6   []  Please Note: Abbreviation single line result  speaker_notes  []  Estim. Avg Glu (eAG) mg/dL 105     Vitamin D 29 LDL 99  BP 114/78   Pulse 86   Temp 98.1 F (36.7 C)   Ht 5\' 4"  (1.626 m)   Wt 174 lb (78.9 kg)   LMP  (LMP Unknown)   SpO2 96%   BMI 29.87 kg/m   BSA 1.89 m       Met 3/3 requirements for 2025 [facility] at [facility] appt complete paperwork with patient.  Vitamin B12 and Vitamin D low recommend supplement and recheck in 3 months. Recheck annual labs fasting in 1 year.

## 2022-04-12 ENCOUNTER — Telehealth (INDEPENDENT_AMBULATORY_CARE_PROVIDER_SITE_OTHER): Payer: No Typology Code available for payment source | Admitting: Psychology

## 2022-04-12 DIAGNOSIS — F909 Attention-deficit hyperactivity disorder, unspecified type: Secondary | ICD-10-CM

## 2022-04-12 DIAGNOSIS — F32A Depression, unspecified: Secondary | ICD-10-CM | POA: Diagnosis not present

## 2022-04-12 DIAGNOSIS — F5089 Other specified eating disorder: Secondary | ICD-10-CM | POA: Diagnosis not present

## 2022-04-12 DIAGNOSIS — F419 Anxiety disorder, unspecified: Secondary | ICD-10-CM

## 2022-04-12 NOTE — Progress Notes (Signed)
  Office: [phone]  /  Fax: [phone]    Date: April 12, 2022    Appointment Start Time: 11:00am Duration: 28 minutes Provider: Glennie Isle, Psy.D. Type of Session: Individual Therapy  Location of Patient: Work (private location) Location of Provider: Provider's Home (private office) Type of Contact: Telepsychological Visit via MyChart Video Visit  Session Content: Haley Salazar is a 27 y.o. female presenting for a follow-up appointment to address the previously established treatment goal of increasing coping skills.Today's appointment was a telepsychological visit. Haley Salazar provided verbal consent for today's telepsychological appointment and she is aware she is responsible for securing confidentiality on her end of the session. Prior to proceeding with today's appointment, Haley Salazar's physical location at the time of this appointment was obtained as well a phone number she could be reached at in the event of technical difficulties. Haley Salazar and this provider participated in today's telepsychological service.   This provider conducted a brief check-in. Haley Salazar shared, "It's been going okay." Despite ongoing work stressors, she discussed making better choices when it comes to eating. A risk assessment was completed. Haley Salazar denied experiencing suicidal, self-harm and homicidal ideation, plan, and intent since the last appointment with this provider. She continues to acknowledge understanding regarding the importance of reaching out to trusted individuals and/or emergency resources if she is unable to ensure safety. She described future plans that include ongoing efforts to improve her well-being and eating habits. Moreover, Haley Salazar discussed challenges eating regularly at work. Thus, she was engaged in problem solving. She agreed to try the following: develop a Freight [employer]; take foods/snacks to work for the week; purchase frozen veggies and approved frozen meals; and double recipes.  Explored/discussed possible obstacles. Overall, Haley Salazar was receptive to today's appointment as evidenced by openness to sharing, responsiveness to feedback, and willingness to implement discussed strategies .  Mental Status Examination:  Appearance: neat Behavior: appropriate to circumstances Mood: neutral Affect: mood congruent Speech: WNL Eye Contact: appropriate Psychomotor Activity: WNL Gait: unable to assess Thought Process: linear, logical, and goal directed and denies suicidal, homicidal, and self-harm ideation, plan and intent  Thought Content/Perception: no hallucinations, delusions, bizarre thinking or behavior endorsed or observed Orientation: AAOx4 Memory/Concentration: memory, attention, language, and fund of knowledge intact  Insight: fair Judgment: fair  Interventions:  Conducted a brief chart review Conducted a risk assessment Reviewed content from the previous session Employed supportive psychotherapy interventions to facilitate reduced distress and to improve coping skills with identified stressors Engaged patient in problem solving  DSM-5 Diagnosis(es):  F50.89 Other Specified Feeding or Eating Disorder, Emotional Eating Behaviors, F90.9 Unspecified Attention-Deficit/Hyperactivity Disorder , F41.9 Unspecified Anxiety Disorder, and  F32.A Unspecified Depressive Disorder  Treatment Goal & Progress: During the initial appointment with this provider, the following treatment goal was established: increase coping skills. Fletcher has demonstrated progress in her goal as evidenced by increased awareness of hunger patterns.   Plan: The next appointment is scheduled for 05/03/2022 at 2:30pm, which will be via MyChart Video Visit. The next session will focus on working towards the established treatment goal. Haley Salazar noted she is in the  process of scheduling an ADHD evaluation with [facility]. Additionally, Haley Salazar will continue with her primary therapist and  psychiatric provider.

## 2022-04-13 ENCOUNTER — Encounter: Payer: Self-pay | Admitting: Bariatrics

## 2022-04-13 ENCOUNTER — Encounter: Payer: Self-pay | Admitting: Registered Nurse

## 2022-04-13 ENCOUNTER — Telehealth: Payer: Self-pay | Admitting: Registered Nurse

## 2022-04-13 DIAGNOSIS — A084 Viral intestinal infection, unspecified: Secondary | ICD-10-CM

## 2022-04-13 DIAGNOSIS — J029 Acute pharyngitis, unspecified: Secondary | ICD-10-CM

## 2022-04-13 MED ORDER — ONDANSETRON HCL 4 MG PO TABS: 4.0000 mg | ORAL_TABLET | Freq: Two times a day (BID) | ORAL | 0 refills | Status: AC | PRN

## 2022-04-13 MED ORDER — LOPERAMIDE HCL 2 MG PO TABS: 2.0000 mg | ORAL_TABLET | Freq: Four times a day (QID) | ORAL | 0 refills | Status: DC | PRN

## 2022-04-13 NOTE — Telephone Encounter (Signed)
Patient left message for clinic staff "Hi, my throat was slightly hurting at the end of yesterday but I woke up multiple times in the middle of the night with severe pain in my throat. It hurt to talk and my body overall felt in pain and I felt a little nauseous. I took some medicine around 3 am and I am waking up feeling like my symptoms are more manageable, but I still feel them all. The only one that went away is the nausea. I took a COVID test and it was negative.  I have no fever currently. "  Patient contacted via telephone.  Stated took tylenol cold and flu this am and feeling a little better ate something asking if she can go to work.  Has had loose stools in the past week "I thought it was foods I was eating"  Nausea without vomiting continues.  Has had diarrhea in the previous 24 hours.  I have recommended clear fluids and advanced to soft as tolerated.  Avoid dairy, spicy and fried foods until diarrhea resolves. Avoid dehydration drink noncaffeinated beverages (water, ginger ale, soup broth, popsicles, no sugar added gatorade/powerade) to urinate every 2-4 hours pale yellow urine.  It is easy to become dehydrated when having diarrhea along with electrolyte imbalances.  Patient to take  temperature and if less than 100.5 F may take over the Imodium 2mg  after each loose stool up to 8 tabs in 24 hours.   exitcare handouts on diarrhea and foods to relieve diarrhea, viral gastroenteritis and nausea/vomiting sent to her my chart. Medications as directed.  I have alerted the patient to call if high fever, dehydration, marked weakness, fainting, increased abdominal pain, blood in stool or vomit. Patient given work/school excuse note for 24 hours per employer infectious disease policy.  RN Kimrey notified to contact HR/patient supervisor today.   I have recommended clear fluids and bland diet.  Avoid dairy/spicy, fried and large portions of meat while having nausea.  If vomiting hold po intake x 1 hour.  Then  sips clear fluids like broths, ginger ale, power ade, gatorade, pedialyte may advance to soft/bland if no vomiting x 24 hours and appetite returned otherwise hydration main focus.  zofran 4mg  1-2 tabs po BID prn n/v   Discussed phenergan, zofran regular and ODT and patient preference was nondrowsy regular tablet has used in the past 2017 with good success.  Return to the clinic if symptoms persist or worsen; I have alerted the patient to call if high fever, dehydration, marked weakness, fainting, increased abdominal pain, blood in stool or vomit (red or black).   Exitcare handout on nausea/vomiting.  Discussed with patient mildly dehydrated today.  Coffee/Tea are diuretic recommended gatorade/nondairy popsicles/ginger ale/broths first line if possible or alternating tea/water.     Discussed most likely viral/post nasal drip irritation.  Usually no specific medical treatment is needed if a virus is causing the sore throat. The throat most often gets better on its own within 5 to 7 days. Antibiotic medicine does not cure viral pharyngitis.  -For acute pharyngitis caused by bacteria, your healthcare provider will prescribe an antibiotic.  -Motrin/Ibuprofen 800mg  by mouth every 8 hours with food or naproxen/naprosyn/aleve 500mg  by mouth every 12 hours with food prn pain/fever and/or Tylenol 1000mg  by mouth every 6 hours as needed for pain or fever OTC ER/call 911 if drooling, unable to swallow, trouble breathing discussed tonsillar abscess symptoms/hot potato voice. -Do not smoke.  -Consider honey 1 tablespoon every 4 hours -  Patient refused Rx for Dukes mouthwash. -Avoid secondhand smoke and other air pollutants.  -Use a cool mist humidifier to add moisture to the air.  -Get plenty of rest; sleep 7-8 hours per night -You may want to rest your throat by talking less and eating a diet that is mostly liquid or soft for a day or two.  -Nonprescription throat lozenges and mouthwashes should help relieve the  soreness.  -Gargling with warm saltwater and drinking warm liquids may help. (You can make a saltwater solution by adding 1/4 teaspoon of salt to 8 ounces, or 240 mL, of warm water.)  -Avoid oral intimate contact until symptoms resolved -Wash dishes/silverware/glasses in hot water or diswasher -Do not share glasses/silverware/dishes during meals that touch your lips/mouth -Usually no specific medical treatment is needed if a virus is causing the sore throat. The throat most often gets better on its own within 5 to 7 days. Antibiotic medicine does not cure viral pharyngitis. DDx mononucleosis, strep throat, hand foot and mouth, post nasal drip irritation pharynx/throat, adenovirus -Exitcare handout on pharyngitis acute sent to patient my chart  FOLLOW UP with clinic provider if no improvements in the next 7-10 days. Discussed sore throat has been common with seasonal allergies and viruses currently circulating in community adenovirus, flu, covid.  Patient verbalized understanding of instructions and agreed with plan of care.  P2: Hand washing and diet.   Patient verbalized agreement and understanding of treatment plan and had no further questions at this time. P2: Hand washing and fitness

## 2022-04-14 NOTE — Telephone Encounter (Signed)
Patient reports feeling better. Denies any NVD in 24 hrs. Denies any fever. Worked 3 hrs yesterday remote. Reports occasional stomach cramps and fatigue. Able to eat and drink. HR and Supervisor aware able to RTW tomorrow or work remote. NP made aware.

## 2022-04-16 NOTE — Telephone Encounter (Signed)
Noted and agreed with plan of care will follow up with patient via telephone this weekend.

## 2022-04-17 NOTE — Telephone Encounter (Signed)
Patient stated feeling well and fatigue resolving.  Work Friday went well and she has no further concerns or questions A&Ox3 spoke full sentences without difficulty.  No audible cough/congestion/throat clearing/shortness of breath.  Stated stomach upset has resolved.

## 2022-04-21 ENCOUNTER — Encounter: Payer: Self-pay | Admitting: Bariatrics

## 2022-04-21 ENCOUNTER — Ambulatory Visit: Payer: No Typology Code available for payment source | Admitting: Bariatrics

## 2022-04-21 VITALS — BP 117/80 | HR 86 | Temp 97.8°F | Ht 64.0 in | Wt 171.0 lb

## 2022-04-21 DIAGNOSIS — R632 Polyphagia: Secondary | ICD-10-CM

## 2022-04-21 DIAGNOSIS — E669 Obesity, unspecified: Secondary | ICD-10-CM | POA: Diagnosis not present

## 2022-04-21 DIAGNOSIS — E559 Vitamin D deficiency, unspecified: Secondary | ICD-10-CM

## 2022-04-21 DIAGNOSIS — Z6829 Body mass index (BMI) 29.0-29.9, adult: Secondary | ICD-10-CM | POA: Diagnosis not present

## 2022-04-21 MED ORDER — VITAMIN D (ERGOCALCIFEROL) 1.25 MG (50000 UNIT) PO CAPS: 50000.0000 [IU] | ORAL_CAPSULE | ORAL | 0 refills | Status: DC

## 2022-04-21 MED ORDER — WEGOVY 0.25 MG/0.5ML ~~LOC~~ SOAJ: 0.2500 mg | SUBCUTANEOUS | 0 refills | Status: DC

## 2022-04-21 NOTE — Progress Notes (Addendum)
   WEIGHT SUMMARY AND BIOMETRICS  Weight Lost Since Last Visit: 4lb   Vitals Temp: 97.8 F (36.6 C) BP: 117/80 Pulse Rate: 86 SpO2: 99 %   Anthropometric Measurements Height: 5\' 4"  (1.626 m) Weight: 171 lb (77.6 kg) BMI (Calculated): 29.34 Weight at Last Visit: 174lb Weight Lost Since Last Visit: 4lb Weight Gained Since Last Visit: 0 Starting Weight: 179lb Total Weight Loss (lbs): 8 lb (3.629 kg)   Body Composition  Body Fat %: 36.7 % Fat Mass (lbs): 63 lbs Muscle Mass (lbs): 103 lbs Total Body Water (lbs): 75 lbs Visceral Fat Rating : 5   Other Clinical Data Fasting: no Labs: no Today's Visit #: 5 Starting Date: 02/23/22    OBESITY Haley Salazar is here to discuss her progress with her obesity treatment plan along with follow-up of her obesity related diagnoses.     Nutrition Plan: the Category 3 plan - 75% adherence.  Current exercise: none  Interim History:  She is down another 3 lbs since her last visit  Protein intake is as prescribed, Not meeting calorie goals., and Water intake is adequate.  Pharmacotherapy: Haley Salazar is on Wegovy 0.25 mg SQ weekly Adverse side effects: None Hunger is moderately controlled.  Cravings are moderately controlled.  Assessment/Plan:    Vitamin D insufficiency  Vitamin D is not at goal of 50.  Most recent vitamin D level was 29. She is on  prescription ergocalciferol 50,000 IU weekly. Lab Results  Component Value Date   VD25OH 29.0 (L) 02/23/2022   VD25OH 17.6 (L) 03/22/2021    Plan: Refill prescription vitamin D 50,000 IU weekly.    Haley Salazar endorses excessive hunger.  Medication(s): [id number]  Effects of medication:  moderately controlled. Cravings are moderately controlled.   Plan: Medication(s): Wegovy 0.25 mg SQ weekly Will increase water, protein and fiber to help assuage hunger.  Will minimize foods that have a high glucose index/load to minimize reactive hypoglycemia.        Generalized Obesity: Current BMI BMI (Calculated): 29.34   Pharmacotherapy Plan Continue and refill  Wegovy 0.25 mg SQ weekly  Haley Salazar is currently in the action stage of change. As such, her goal is to continue with weight loss efforts.  She has agreed to the Category 3 plan.  Exercise goals: For substantial health benefits, adults should do at least 150 minutes (2 hours and 30 minutes) a week of moderate-intensity, or 75 minutes (1 hour and 15 minutes) a week of vigorous-intensity aerobic physical activity, or an equivalent combination of moderate- and vigorous-intensity aerobic activity. Aerobic activity should be performed in episodes of at least 10 minutes, and preferably, it should be spread throughout the week.  Behavioral modification strategies: decreasing simple carbohydrates , no meal skipping, meal planning , and increase frequency of journaling.  Haley Salazar has agreed to follow-up with our clinic in 3 weeks.       Objective:   VITALS: Per patient if applicable, see vitals. GENERAL: Alert and in no acute distress. CARDIOPULMONARY: No increased WOB. Speaking in clear sentences.  PSYCH: Pleasant and cooperative. Speech normal rate and rhythm. Affect is appropriate. Insight and judgement are appropriate. Attention is focused, linear, and appropriate.  NEURO: Oriented as arrived to appointment on time with no prompting.   Attestation Statements:    This was prepared with the assistance of Presenter, broadcasting.  Occasional wrong-word or sound-a-like substitutions may have occurred due to the inherent limitations of voice recognition software.   Jearld Lesch, DO

## 2022-05-03 ENCOUNTER — Telehealth (INDEPENDENT_AMBULATORY_CARE_PROVIDER_SITE_OTHER): Payer: No Typology Code available for payment source | Admitting: Psychology

## 2022-05-03 DIAGNOSIS — F5089 Other specified eating disorder: Secondary | ICD-10-CM | POA: Diagnosis not present

## 2022-05-03 DIAGNOSIS — F419 Anxiety disorder, unspecified: Secondary | ICD-10-CM

## 2022-05-03 DIAGNOSIS — F909 Attention-deficit hyperactivity disorder, unspecified type: Secondary | ICD-10-CM | POA: Diagnosis not present

## 2022-05-03 DIAGNOSIS — F32A Depression, unspecified: Secondary | ICD-10-CM | POA: Diagnosis not present

## 2022-05-03 NOTE — Progress Notes (Signed)
Office: [phone]  /  Fax: [phone]    Date: May 03, 2022    Appointment Start Time: 2:31pm Duration: 33 minutes Provider: Lawerance Cruel, Psy.D. Type of Session: Individual Therapy  Location of Patient: Work (private location) Location of Provider: Provider's Home (private office) Type of Contact: Telepsychological Visit via MyChart Video Visit  Session Content: Haley Salazar is a 27 y.o. female presenting for a follow-up appointment to address the previously established treatment goal of increasing coping skills.Today's appointment was a telepsychological visit. Haley Salazar provided verbal consent for today's telepsychological appointment and she is aware she is responsible for securing confidentiality on her end of the session. Prior to proceeding with today's appointment, Haley Salazar's physical location at the time of this appointment was obtained as well a phone number she could be reached at in the event of technical difficulties. Haley Salazar and this provider participated in today's telepsychological service.   This provider conducted a brief check-in. Haley Salazar shared about recent work stress. Despite ongoing stressors, she reported an improvement in eating habits and implementation of discussed strategies. A risk assessment was completed. Haley Salazar reported experiencing suicidal ideation since the last appointment with this provider, noting it was "super bad" on Sunday. She stated she coped by taking time for herself. She denied experiencing suicidal plan and intent. She denied experiencing suicidal ideation, plan, and intent during today's appointment, noting the last time was Sunday. Haley Salazar noted a plan to discuss further with her primary therapist during their next appointment this week. She denied experiencing self-harm and homicidal ideation, plan, and intent since the last appointment with this provider. She continues to acknowledge understanding regarding the importance of reaching out to  trusted individuals and/or emergency resources if she is unable to ensure safety. She described future plans that include ongoing efforts to improve her well-being and eating habits. Moreover, psychoeducation provided regarding self-compassion. Haley Salazar was engaged in a self-compassion exercise to help with eating-related challenges and other ongoing stressors. She was encouraged to regularly ask herself, "What do I need right now?" and "How can I comfort and care for myself in this moment?" Overall, Haley Salazar was receptive to today's appointment as evidenced by openness to sharing, responsiveness to feedback, and willingness to work toward increasing self-compassion.  Mental Status Examination:  Appearance: neat Behavior: appropriate to circumstances Mood: neutral Affect: mood congruent Speech: WNL Eye Contact: appropriate Psychomotor Activity: WNL Gait: unable to assess Thought Process: linear, logical, and goal directed and denies suicidal, homicidal, and self-harm ideation, plan and intent  Thought Content/Perception: no hallucinations, delusions, bizarre thinking or behavior endorsed or observed Orientation: AAOx4 Memory/Concentration: memory, attention, language, and fund of knowledge intact  Insight: fair Judgment: fair  Interventions:  Conducted a brief chart review Conducted a risk assessment Provided empathic reflections and validation Reviewed content from the previous session Provided positive reinforcement Employed supportive psychotherapy interventions to facilitate reduced distress and to improve coping skills with identified stressors Psychoeducation provided regarding self-compassion Engaged pt in a self-compassion exercise  DSM-5 Diagnosis(es):  F50.89 Other Specified Feeding or Eating Disorder, Emotional Eating Behaviors, F90.9 Unspecified Attention-Deficit/Hyperactivity Disorder , F41.9 Unspecified Anxiety Disorder, and  F32.A Unspecified Depressive  Disorder  Treatment Goal & Progress: During the initial appointment with this provider, the following treatment goal was established: increase coping skills. Haley Salazar has demonstrated progress in her goal as evidenced by increased awareness of hunger patterns. Haley Salazar also continues to demonstrate willingness to engage in learned skill(s).  Plan: The next appointment is scheduled for 05/10/2022 at 2pm, which will be via MyChart  Video Visit. The next session will focus on working towards the established treatment goal. Haley Salazar will continue with her primary therapist, and their next appointment is this Friday. She is scheduled for an ADHD evaluation on November 07, 2022 with [facility].

## 2022-05-10 ENCOUNTER — Telehealth (INDEPENDENT_AMBULATORY_CARE_PROVIDER_SITE_OTHER): Payer: No Typology Code available for payment source | Admitting: Psychology

## 2022-05-10 DIAGNOSIS — F419 Anxiety disorder, unspecified: Secondary | ICD-10-CM

## 2022-05-10 DIAGNOSIS — F909 Attention-deficit hyperactivity disorder, unspecified type: Secondary | ICD-10-CM | POA: Diagnosis not present

## 2022-05-10 DIAGNOSIS — F32A Depression, unspecified: Secondary | ICD-10-CM

## 2022-05-10 DIAGNOSIS — F5089 Other specified eating disorder: Secondary | ICD-10-CM | POA: Diagnosis not present

## 2022-05-10 NOTE — Progress Notes (Signed)
Office: [phone]  /  Fax: [phone]    Date: May 10, 2022    Appointment Start Time: 2:00pm Duration: 34 minutes Provider: Lawerance Cruel, Psy.D. Type of Session: Individual Therapy  Location of Patient: Work (private location) Location of Provider: Provider's Home (private office) Type of Contact: Telepsychological Visit via MyChart Video Visit  Session Content: Sibley is a 27 y.o. female presenting for a follow-up appointment to address the previously established treatment goal of increasing coping skills.Today's appointment was a telepsychological visit. Haley Salazar provided verbal consent for today's telepsychological appointment and she is aware she is responsible for securing confidentiality on her end of the session. Prior to proceeding with today's appointment, Katryna's physical location at the time of this appointment was obtained as well a phone number she could be reached at in the event of technical difficulties. Haley Salazar and this provider participated in today's telepsychological service.   This provider conducted a brief check-in. Haley Salazar shared about deviations secondary to her birthday, noting she is back on track with her eating habits. A risk assessment was completed. Haley Salazar denied experiencing suicidal, self-harm and homicidal ideation, plan, and intent since the last appointment with this provider. She reported she continues to have easy access to the developed safety plan, and continues to acknowledge understanding regarding the importance of reaching out to trusted individuals and/or emergency resources if she is unable to ensure safety. Remainder of session focused further on self-compassion. Limited self-care was identified. As such, psychoeducation regarding the importance of self-care utilizing the oxygen mask metaphor was provided. Psychoeducation regarding pleasurable activities, including its impact on emotional eating and overall well-being was also  provided. Ixchel was provided with a handout with various options for pleasurable activities, and was encouraged to engage in one activity a day and additional activities as needed when triggered to emotionally eat. Haley Salazar agreed. Haley Salazar provided verbal consent during today's appointment for this provider to send a handout with  pleasurable activities via e-mail. Overall, Haley Salazar was receptive to today's appointment as evidenced by openness to sharing, responsiveness to feedback, and willingness to engage in pleasurable activities to assist with coping.  Mental Status Examination:  Appearance: neat Behavior: appropriate to circumstances Mood: neutral Affect: mood congruent Speech: WNL Eye Contact: appropriate Psychomotor Activity: WNL Gait: unable to assess Thought Process: linear, logical, and goal directed and denies suicidal, homicidal, and self-harm ideation, plan and intent  Thought Content/Perception: no hallucinations, delusions, bizarre thinking or behavior endorsed or observed Orientation: AAOx4 Memory/Concentration: memory, attention, language, and fund of knowledge intact  Insight: fair Judgment: fair  Interventions:  Conducted a brief chart review Conducted a risk assessment Provided empathic reflections and validation Reviewed content from the previous session Provided positive reinforcement Employed supportive psychotherapy interventions to facilitate reduced distress and to improve coping skills with identified stressors Psychoeducation provided regarding pleasurable activities Psychoeducation provided regarding self-care  DSM-5 Diagnosis(es):  F50.89 Other Specified Feeding or Eating Disorder, Emotional Eating Behaviors, F90.9 Unspecified Attention-Deficit/Hyperactivity Disorder , F41.9 Unspecified Anxiety Disorder, and  F32.A Unspecified Depressive Disorder  Treatment Goal & Progress: During the initial appointment with this provider, the following treatment goal  was established: increase coping skills. Rovena has demonstrated progress in her goal as evidenced by increased awareness of hunger patterns. Haley Salazar also continues to demonstrate willingness to engage in learned skill(s).  Plan: The next appointment is scheduled for 05/30/2022 at 2pm, which will be via MyChart Video Visit. The next session will focus on working towards the established treatment goal. Haley Salazar will continue with her primary  therapist.

## 2022-05-16 ENCOUNTER — Ambulatory Visit: Payer: No Typology Code available for payment source | Admitting: Nurse Practitioner

## 2022-05-16 ENCOUNTER — Encounter: Payer: Self-pay | Admitting: Nurse Practitioner

## 2022-05-16 ENCOUNTER — Ambulatory Visit: Payer: No Typology Code available for payment source | Admitting: Occupational Medicine

## 2022-05-16 VITALS — BP 116/83 | HR 75 | Temp 98.1°F | Ht 64.0 in | Wt 168.0 lb

## 2022-05-16 DIAGNOSIS — E669 Obesity, unspecified: Secondary | ICD-10-CM | POA: Diagnosis not present

## 2022-05-16 DIAGNOSIS — Z6828 Body mass index (BMI) 28.0-28.9, adult: Secondary | ICD-10-CM | POA: Diagnosis not present

## 2022-05-16 DIAGNOSIS — E559 Vitamin D deficiency, unspecified: Secondary | ICD-10-CM

## 2022-05-16 DIAGNOSIS — Z0000 Encounter for general adult medical examination without abnormal findings: Secondary | ICD-10-CM

## 2022-05-16 MED ORDER — WEGOVY 0.25 MG/0.5ML ~~LOC~~ SOAJ: 0.2500 mg | SUBCUTANEOUS | 0 refills | Status: DC

## 2022-05-16 NOTE — Progress Notes (Signed)
Office: [phone]  /  Fax: [phone]  WEIGHT SUMMARY AND BIOMETRICS  Weight Lost Since Last Visit: 6lb  No data recorded  Vitals Temp: 98.1 F (36.7 C) BP: 116/83 Pulse Rate: 75 SpO2: 97 %   Anthropometric Measurements Height: 5\' 4"  (1.626 m) Weight: 168 lb (76.2 kg) BMI (Calculated): 28.82 Weight at Last Visit: 174lb Weight Lost Since Last Visit: 6lb Starting Weight: 179lb Total Weight Loss (lbs): 11 lb (4.99 kg)   Body Composition  Body Fat %: 37.7 % Fat Mass (lbs): 63.4 lbs Muscle Mass (lbs): 99.4 lbs Total Body Water (lbs): 77.4 lbs Visceral Fat Rating : 5   Other Clinical Data Fasting: No Labs: No Today's Visit #: 6 Starting Date: 02/23/22     HPI  Chief Complaint: OBESITY  Haley Salazar is here to discuss her progress with her obesity treatment plan. She is on the the Category 3 Plan and states she is following her eating plan approximately 65 % of the time. She states she is exercising 60 minutes 3 days per week.   Interval History:  Since last office visit she has lost 6 pounds. She celebrated her birthday since her last visit.  She is struggling with cravings. She is averaging around 85 grams of protein daily.  She is drinking 2 protein shakes, water and occ ginger ale daily.   She is going to [employer] 3 days per week since stopping [employer].   Pharmacotherapy for weight loss: She is currently taking Wegovy 0.25mg  for medical weight loss.  Denies side effects.    Vit D deficiency  She is taking Vit D 50,000 IU weekly.  Denies side effects.  Denies nausea, vomiting or muscle weakness.    Lab Results  Component Value Date   VD25OH 29.0 (L) 02/23/2022   VD25OH 17.6 (L) 03/22/2021     PHYSICAL EXAM:  Blood pressure 116/83, pulse 75, temperature 98.1 F (36.7 C), height 5\' 4"  (1.626 m), weight 168 lb (76.2 kg), SpO2 97 %. Body mass index is 28.84 kg/m.  General: She is overweight, cooperative, alert, well developed, and in no  acute distress. PSYCH: Has normal mood, affect and thought process.   Extremities: No edema.  Neurologic: No gross sensory or motor deficits. No tremors or fasciculations noted.    DIAGNOSTIC DATA REVIEWED:  BMET    Component Value Date/Time   NA 141 02/23/2022 0952   K 4.6 02/23/2022 0952   CL 104 02/23/2022 0952   CO2 20 02/23/2022 0952   GLUCOSE 80 02/23/2022 0952   GLUCOSE 96 06/22/2020 1357   BUN 9 02/23/2022 0952   CREATININE 0.77 02/23/2022 0952   CREATININE 0.67 10/29/2019 0910   CALCIUM 9.9 02/23/2022 0952   GFRNONAA 124 10/29/2019 0910   GFRAA 144 10/29/2019 0910   Lab Results  Component Value Date   HGBA1C 5.3 02/23/2022   HGBA1C 5.7 06/22/2020   Lab Results  Component Value Date   INSULIN 13.7 02/23/2022   Lab Results  Component Value Date   TSH 1.210 02/23/2022   CBC    Component Value Date/Time   WBC 7.2 03/22/2021 0930   WBC 9.4 06/22/2020 1357   RBC 4.56 03/22/2021 0930   RBC 4.41 06/22/2020 1357   HGB 13.3 03/22/2021 0930   HCT 39.6 02/23/2022 0952   PLT 294 03/22/2021 0930   MCV 89 03/22/2021 0930   MCH 29.2 03/22/2021 0930   MCH 29.2 10/29/2019 0910   MCHC 32.8 03/22/2021 0930   MCHC 33.7 06/22/2020  1357   RDW 12.1 03/22/2021 0930   Iron Studies    Component Value Date/Time   IRON 105 02/23/2022 0952   TIBC 422 02/23/2022 0952   FERRITIN 87 02/23/2022 0952   IRONPCTSAT 25 02/23/2022 0952   Lipid Panel     Component Value Date/Time   CHOL 182 02/23/2022 0952   TRIG 177 (H) 02/23/2022 0952   HDL 53 02/23/2022 0952   CHOLHDL 4.4 03/22/2021 0930   CHOLHDL 4 06/22/2020 1357   VLDL 40.2 (H) 06/22/2020 1357   LDLCALC 99 02/23/2022 0952   LDLCALC 141 (H) 10/29/2019 0910   LDLDIRECT 122.0 06/22/2020 1357   Hepatic Function Panel     Component Value Date/Time   PROT 7.7 02/23/2022 0952   ALBUMIN 4.7 02/23/2022 0952   AST 13 02/23/2022 0952   ALT 10 02/23/2022 0952   ALKPHOS 62 02/23/2022 0952   BILITOT 0.3 02/23/2022 0952       Component Value Date/Time   TSH 1.210 02/23/2022 0952   Nutritional Lab Results  Component Value Date   VD25OH 29.0 (L) 02/23/2022   VD25OH 17.6 (L) 03/22/2021     ASSESSMENT AND PLAN  TREATMENT PLAN FOR OBESITY:  Recommended Dietary Goals  Haley Salazar is currently in the action stage of change. As such, her goal is to continue weight management plan. She has agreed to keeping a food journal and adhering to recommended goals of 1700 calories and 100 protein.  Will increase calories and see how she does concerning her hunger.  She is working out several days pre week and notes increased hunger.  Will aim to eat more protein.    Behavioral Intervention  We discussed the following Behavioral Modification Strategies today: increasing lean protein intake, decreasing simple carbohydrates , increasing vegetables, increasing lower glycemic fruits, increasing fiber rich foods, avoiding skipping meals, increasing water intake, continue to practice mindfulness when eating, and planning for success.  Additional resources provided today: NA  Recommended Physical Activity Goals  Haley Salazar has been advised to work up to 150 minutes of moderate intensity aerobic activity a week and strengthening exercises 2-3 times per week for cardiovascular health, weight loss maintenance and preservation of muscle mass.   She has agreed to Continue current level of physical activity    Pharmacotherapy We discussed various medication options to help [name] with her weight loss efforts and we both agreed to continue Wegovy 0.25mg  due to adequate weight loss.  ASSOCIATED CONDITIONS ADDRESSED TODAY  Action/Plan  Vitamin D insufficiency Continue Vit D as directed.    Generalized obesity  BMI 28.0-28.9,adult  Other orders -     ZOXWRU; Inject 0.25 mg into the skin once a week.  Dispense: 2 mL; Refill: 0         Return in about 4 weeks (around 06/13/2022).Marland Kitchen She was informed of the importance of  frequent follow up visits to maximize her success with intensive lifestyle modifications for her multiple health conditions.   ATTESTASTION STATEMENTS:  Reviewed by clinician on day of visit: allergies, medications, problem list, medical history, surgical history, family history, social history, and previous encounter notes.      Theodis Sato. Jaryiah Mehlman FNP-C

## 2022-05-16 NOTE — Progress Notes (Signed)
Be well insurance premium discount evaluation: Met  Patient completed PCM office visit epic reviewed by RN Kimrey and transcribed. Labs  Tobacco attestation signed. [facility] ROI formed signed. Forms placed in the chart.   Patient given handouts for [facility] pharmacies and discount drugs list,MyChart, Tele doc setup, Tele doc Behavioral, [place] counseling and [facility].  What to do for infectious illness protocol. Given handout for list of medications that can be filled at [facility]. Given Clinic hours and Clinic Email.  

## 2022-05-20 ENCOUNTER — Other Ambulatory Visit: Payer: Self-pay | Admitting: Nurse Practitioner

## 2022-05-24 ENCOUNTER — Other Ambulatory Visit: Payer: Self-pay | Admitting: Nurse Practitioner

## 2022-05-24 ENCOUNTER — Encounter: Payer: Self-pay | Admitting: Nurse Practitioner

## 2022-05-24 DIAGNOSIS — E669 Obesity, unspecified: Secondary | ICD-10-CM

## 2022-05-24 MED ORDER — WEGOVY 0.5 MG/0.5ML ~~LOC~~ SOAJ
0.5000 mg | SUBCUTANEOUS | 0 refills | Status: DC
Start: 2022-05-24 — End: 2022-06-08

## 2022-05-30 ENCOUNTER — Telehealth (INDEPENDENT_AMBULATORY_CARE_PROVIDER_SITE_OTHER): Payer: No Typology Code available for payment source | Admitting: Psychology

## 2022-05-30 DIAGNOSIS — F909 Attention-deficit hyperactivity disorder, unspecified type: Secondary | ICD-10-CM | POA: Diagnosis not present

## 2022-05-30 DIAGNOSIS — F32A Depression, unspecified: Secondary | ICD-10-CM

## 2022-05-30 DIAGNOSIS — F419 Anxiety disorder, unspecified: Secondary | ICD-10-CM | POA: Diagnosis not present

## 2022-05-30 DIAGNOSIS — F5089 Other specified eating disorder: Secondary | ICD-10-CM | POA: Diagnosis not present

## 2022-05-30 NOTE — Progress Notes (Signed)
Office: [phone]  /  Fax: [phone]    Date: May 30, 2022    Appointment Start Time: 2:01pm Duration: 32 minutes Provider: Lawerance Cruel, Psy.D. Type of Session: Individual Therapy  Location of Patient: Parked in car (address obtained; private/safe location) Location of Provider: Provider's Home (private office) Type of Contact: Telepsychological Visit via MyChart Video Visit  Session Content: Haley Salazar is a 27 y.o. female presenting for a follow-up appointment to address the previously established treatment goal of increasing coping skills.Today's appointment was a telepsychological visit. Haley Salazar provided verbal consent for today's telepsychological appointment and she is aware she is responsible for securing confidentiality on her end of the session. Prior to proceeding with today's appointment, Graciana's physical location at the time of this appointment was obtained as well a phone number she could be reached at in the event of technical difficulties. Haley Salazar and this provider participated in today's telepsychological service.   This provider conducted a brief check-in. Haley Salazar shared she took the day off to see a friend. A risk assessment was completed. Haley Salazar denied experiencing suicidal, self-harm and homicidal ideation, plan, and intent since the last appointment with this provider. She reported she continues to have easy access to the developed safety plan, and continues to acknowledge understanding regarding the importance of reaching out to trusted individuals and/or emergency resources if she is unable to ensure safety. Regarding eating habits, she stated, "It is going okay." Further explored. She stated she is engaging in emotional eating behaviors due to a recent conflict with a nurse. Additionally, Haley Salazar further stated she is experiencing decreased appetite and GI symptoms since around Friday when eating which she attributed to an increase in anxiety. Engaged Haley Salazar  in a grounding exercise (5-4-3-2-1). It was recommended she follow-up with her PCP as well as primary therapist. She stated she has not taken hydroxyzine as it makes her sleepy. This provider recommended she reach out to her psychiatrist as well. She noted a plan to drive home listening to stand up comedy, and watch Bob's Burgers when she is home. Haley Salazar provided verbal consent during today's appointment for this provider to send the handout for today's grounding exercise via e-mail. Overall, Haley Salazar was receptive to today's appointment as evidenced by openness to sharing, responsiveness to feedback., and willingness to follow-up with recommendations.   Mental Status Examination:  Appearance: neat Behavior: appropriate to circumstances Mood: anxious Affect: mood congruent; tearful at times Speech: WNL Eye Contact: appropriate Psychomotor Activity: WNL Gait: unable to assess Thought Process: linear, logical, and goal directed and denies suicidal, homicidal, and self-harm ideation, plan and intent  Thought Content/Perception: no hallucinations, delusions, bizarre thinking or behavior endorsed or observed Orientation: AAOx4 Memory/Concentration: memory, attention, language, and fund of knowledge intact  Insight: fair Judgment: fair  Interventions:  Conducted a brief chart review Conducted a risk assessment Provided empathic reflections and validation Employed supportive psychotherapy interventions to facilitate reduced distress and to improve coping skills with identified stressors Engaged patient in problem solving Engaged patient in a grounding exercise   DSM-5 Diagnosis(es):  F50.89 Other Specified Feeding or Eating Disorder, Emotional Eating Behaviors, F90.9 Unspecified Attention-Deficit/Hyperactivity Disorder , F41.9 Unspecified Anxiety Disorder, and  F32.A Unspecified Depressive Disorder  Treatment Goal & Progress: During the initial appointment with this provider, the following  treatment goal was established: increase coping skills. Dajour has demonstrated progress in her goal as evidenced by increased awareness of hunger patterns. Haley Salazar also continues to demonstrate willingness to engage in learned skill(s).  Plan: The next appointment is  scheduled for 06/21/2022 at 8:30am, which will be via MyChart Video Visit. The next session will focus on working towards the established treatment goal. Haley Salazar will continue with her primary therapist.

## 2022-06-08 ENCOUNTER — Ambulatory Visit: Payer: No Typology Code available for payment source | Admitting: Bariatrics

## 2022-06-08 ENCOUNTER — Encounter: Payer: Self-pay | Admitting: Bariatrics

## 2022-06-08 VITALS — BP 131/82 | HR 89 | Temp 97.9°F | Ht 64.0 in | Wt 167.0 lb

## 2022-06-08 DIAGNOSIS — E669 Obesity, unspecified: Secondary | ICD-10-CM | POA: Diagnosis not present

## 2022-06-08 DIAGNOSIS — R632 Polyphagia: Secondary | ICD-10-CM | POA: Diagnosis not present

## 2022-06-08 DIAGNOSIS — Z6828 Body mass index (BMI) 28.0-28.9, adult: Secondary | ICD-10-CM

## 2022-06-08 DIAGNOSIS — E559 Vitamin D deficiency, unspecified: Secondary | ICD-10-CM | POA: Diagnosis not present

## 2022-06-08 MED ORDER — ONDANSETRON HCL 4 MG PO TABS: 4.0000 mg | ORAL_TABLET | Freq: Three times a day (TID) | ORAL | 0 refills | Status: DC | PRN

## 2022-06-08 MED ORDER — WEGOVY 0.5 MG/0.5ML ~~LOC~~ SOAJ: 0.5000 mg | SUBCUTANEOUS | 0 refills | Status: DC

## 2022-06-08 MED ORDER — VITAMIN D (ERGOCALCIFEROL) 1.25 MG (50000 UNIT) PO CAPS: 50000.0000 [IU] | ORAL_CAPSULE | ORAL | 0 refills | Status: DC

## 2022-06-12 NOTE — Telephone Encounter (Signed)
Be well paperwork signed 05/17/22

## 2022-06-14 ENCOUNTER — Encounter: Payer: Self-pay | Admitting: Bariatrics

## 2022-06-14 NOTE — Progress Notes (Signed)
Chief Complaint:   OBESITY Haley Salazar is here to discuss her progress with her obesity treatment plan along with follow-up of her obesity related diagnoses. Haley Salazar is on keeping a food journal and adhering to recommended goals of 1700 calories and 100 grams of protein and states she is following her eating plan approximately 25% of the time. Haley Salazar states she is doing cardio and weights for 20-30 minutes 2-3 times per week.  Today's visit was #: 7 Starting weight: 179 lbs Starting date: 02/23/2022 Today's weight: 167 lbs Today's date: 06/08/2022 Total lbs lost to date: 12 Total lbs lost since last in-office visit: 1  Interim History: Patient is down 1 pound since her last visit.  She has had diarrhea and some nausea.  She is doing well with her water intake.  Subjective:   1. Vitamin D insufficiency Patient is taking vitamin D prescription.  2. Polyphagia Patient is taking Wegovy.  Assessment/Plan:   1. Vitamin D insufficiency Patient will continue prescription vitamin D, and we will refill for 1 month.  - Vitamin D, Ergocalciferol, (DRISDOL) 1.25 MG (50000 UNIT) CAPS capsule; Take 1 capsule (50,000 Units total) by mouth every 7 (seven) days.  Dispense: 5 capsule; Refill: 0  2. Polyphagia Patient will continue her medications, and we will refill Wegovy at 0.5 mg and Zofran 4 mg for 1 month.  - Semaglutide-Weight Management (WEGOVY) 0.5 MG/0.5ML SOAJ; Inject 0.5 mg into the skin once a week.  Dispense: 2 mL; Refill: 0 - ondansetron (ZOFRAN) 4 MG tablet; Take 1 tablet (4 mg total) by mouth every 8 (eight) hours as needed for nausea or vomiting.  Dispense: 20 tablet; Refill: 0  3. Generalized obesity - Semaglutide-Weight Management (WEGOVY) 0.5 MG/0.5ML SOAJ; Inject 0.5 mg into the skin once a week.  Dispense: 2 mL; Refill: 0  4. BMI 28.0-28.9,adult Haley Salazar is currently in the action stage of change. As such, her goal is to continue with weight loss efforts. She has  agreed to the Category 3 Plan.   Meal planning was discussed.  Patient is to keep water intake high.  We will recheck fasting labs at her next visit.  Exercise goals: As is.  Behavioral modification strategies: increasing lean protein intake, decreasing simple carbohydrates, increasing vegetables, increasing water intake, decreasing eating out, no skipping meals, meal planning and cooking strategies, keeping healthy foods in the home, and planning for success.  Haley Salazar has agreed to follow-up with our clinic in 4 weeks. She was informed of the importance of frequent follow-up visits to maximize her success with intensive lifestyle modifications for her multiple health conditions.   Objective:   Blood pressure 131/82, pulse 89, temperature 97.9 F (36.6 C), height 5\' 4"  (1.626 m), weight 167 lb (75.8 kg), SpO2 96 %. Body mass index is 28.67 kg/m.  General: Cooperative, alert, well developed, in no acute distress. HEENT: Conjunctivae and lids unremarkable. Cardiovascular: Regular rhythm.  Lungs: Normal work of breathing. Neurologic: No focal deficits.   Lab Results  Component Value Date   CREATININE 0.77 02/23/2022   BUN 9 02/23/2022   NA 141 02/23/2022   K 4.6 02/23/2022   CL 104 02/23/2022   CO2 20 02/23/2022   Lab Results  Component Value Date   ALT 10 02/23/2022   AST 13 02/23/2022   GGT 9 03/22/2021   ALKPHOS 62 02/23/2022   BILITOT 0.3 02/23/2022   Lab Results  Component Value Date   HGBA1C 5.3 02/23/2022   HGBA1C 5.5 03/22/2021  HGBA1C 5.7 06/22/2020   Lab Results  Component Value Date   INSULIN 13.7 02/23/2022   Lab Results  Component Value Date   TSH 1.210 02/23/2022   Lab Results  Component Value Date   CHOL 182 02/23/2022   HDL 53 02/23/2022   LDLCALC 99 02/23/2022   LDLDIRECT 122.0 06/22/2020   TRIG 177 (H) 02/23/2022   CHOLHDL 4.4 03/22/2021   Lab Results  Component Value Date   VD25OH 29.0 (L) 02/23/2022   VD25OH 17.6 (L) 03/22/2021    Lab Results  Component Value Date   WBC 7.2 03/22/2021   HGB 13.3 03/22/2021   HCT 39.6 02/23/2022   MCV 89 03/22/2021   PLT 294 03/22/2021   Lab Results  Component Value Date   IRON 105 02/23/2022   TIBC 422 02/23/2022   FERRITIN 87 02/23/2022   Attestation Statements:   Reviewed by clinician on day of visit: allergies, medications, problem list, medical history, surgical history, family history, social history, and previous encounter notes.   Trude Mcburney, am acting as Energy manager for [employer], DO.  I have reviewed the above documentation for accuracy and completeness, and I agree with the above. Corinna Capra, DO

## 2022-06-21 ENCOUNTER — Telehealth (INDEPENDENT_AMBULATORY_CARE_PROVIDER_SITE_OTHER): Payer: No Typology Code available for payment source | Admitting: Psychology

## 2022-06-21 DIAGNOSIS — F5089 Other specified eating disorder: Secondary | ICD-10-CM | POA: Diagnosis not present

## 2022-06-21 DIAGNOSIS — F32A Depression, unspecified: Secondary | ICD-10-CM

## 2022-06-21 DIAGNOSIS — F909 Attention-deficit hyperactivity disorder, unspecified type: Secondary | ICD-10-CM

## 2022-06-21 DIAGNOSIS — F419 Anxiety disorder, unspecified: Secondary | ICD-10-CM | POA: Diagnosis not present

## 2022-06-21 NOTE — Progress Notes (Signed)
  Office: [phone]  /  Fax: [phone]    Date: June 21, 2022    Appointment Start Time: 8:31am Duration: 22 minutes Provider: Lawerance Cruel, Psy.D. Type of Session: Individual Therapy  Location of Patient: Work (private location) Location of Provider: Provider's Home (private office) Type of Contact: Telepsychological Visit via MyChart Video Visit  Session Content: Haley Salazar is a 27 y.o. female presenting for a follow-up appointment to address the previously established treatment goal of increasing coping skills.Today's appointment was a telepsychological visit. Haley Salazar provided verbal consent for today's telepsychological appointment and she is aware she is responsible for securing confidentiality on her end of the session. Prior to proceeding with today's appointment, Haley Salazar's physical location at the time of this appointment was obtained as well a phone number she could be reached at in the event of technical difficulties. Haley Salazar and this provider participated in today's telepsychological service.   This provider conducted a brief check-in. Haley Salazar discussed experiencing ongoing anxiety and depression-related symptomatology resulting in GI symptoms; however, she continues to eat. She stated she is discussing work-related stressors with her primary therapist, and she agreed to contact her psychiatrist today. A risk assessment was completed. Haley Salazar denied experiencing suicidal, self-harm and homicidal ideation, plan, and intent since the last appointment with this provider. She described future plans that include ongoing efforts to improve her well-being and eating habits. Regarding eating habits, she stated she is typically "not overeating," but she feels she is not eating enough due to stomach discomfort. She was agreeable to eating smaller/frequent meals. Haley Salazar was engaged in problem solving to find foods that fit her goals and sound appetizing given current symptoms. Reviewed  grounding exercise. Overall, Haley Salazar was receptive to today's appointment as evidenced by openness to sharing, responsiveness to feedback, and willingness to implement discussed strategies .  Mental Status Examination:  Appearance: neat Behavior: appropriate to circumstances Mood: anxious Affect: mood congruent Speech: WNL Eye Contact: appropriate Psychomotor Activity: WNL Gait: unable to assess Thought Process: linear, logical, and goal directed and denies suicidal, homicidal, and self-harm ideation, plan and intent  Thought Content/Perception: no hallucinations, delusions, bizarre thinking or behavior endorsed or observed Orientation: AAOx4 Memory/Concentration: intact Insight: fair Judgment: fair  Interventions:  Conducted a brief chart review Conducted a risk assessment Provided empathic reflections and validation Reviewed content from the previous session Provided positive reinforcement Employed supportive psychotherapy interventions to facilitate reduced distress and to improve coping skills with identified stressors Engaged patient in problem solving  DSM-5 Diagnosis(es):  F50.89 Other Specified Feeding or Eating Disorder, Emotional Eating Behaviors, F90.9 Unspecified Attention-Deficit/Hyperactivity Disorder , F41.9 Unspecified Anxiety Disorder, and  F32.A Unspecified Depressive Disorder  Treatment Goal & Progress: During the initial appointment with this provider, the following treatment goal was established: increase coping skills. Haley Salazar has demonstrated progress in her goal as evidenced by increased awareness of hunger patterns and increased awareness of triggers for emotional eating behaviors. Haley Salazar also continues to demonstrate willingness to engage in learned skill(s).  Plan: The next appointment is scheduled for 07/12/2022 at 2:30pm, which will be via MyChart Video Visit. The next session will focus on working towards the established treatment goal. Haley Salazar will  continue with her primary therapist, and discuss increasing frequency of appointments. She will also contact her psychiatrist to discuss increased anxiety and depression-related symptomatology.

## 2022-07-03 NOTE — Progress Notes (Signed)
Noted patient has had follow up labs 

## 2022-07-07 ENCOUNTER — Encounter: Payer: Self-pay | Admitting: Bariatrics

## 2022-07-07 ENCOUNTER — Ambulatory Visit: Payer: No Typology Code available for payment source | Admitting: Bariatrics

## 2022-07-07 VITALS — BP 104/72 | HR 87 | Temp 98.0°F | Ht 64.0 in | Wt 164.0 lb

## 2022-07-07 DIAGNOSIS — R7309 Other abnormal glucose: Secondary | ICD-10-CM

## 2022-07-07 DIAGNOSIS — Z6828 Body mass index (BMI) 28.0-28.9, adult: Secondary | ICD-10-CM | POA: Diagnosis not present

## 2022-07-07 DIAGNOSIS — E781 Pure hyperglyceridemia: Secondary | ICD-10-CM

## 2022-07-07 DIAGNOSIS — E559 Vitamin D deficiency, unspecified: Secondary | ICD-10-CM

## 2022-07-07 DIAGNOSIS — R632 Polyphagia: Secondary | ICD-10-CM | POA: Diagnosis not present

## 2022-07-07 DIAGNOSIS — E669 Obesity, unspecified: Secondary | ICD-10-CM | POA: Diagnosis not present

## 2022-07-07 DIAGNOSIS — E538 Deficiency of other specified B group vitamins: Secondary | ICD-10-CM

## 2022-07-07 MED ORDER — VITAMIN D (ERGOCALCIFEROL) 1.25 MG (50000 UNIT) PO CAPS: 50000.0000 [IU] | ORAL_CAPSULE | ORAL | 0 refills | Status: DC

## 2022-07-07 MED ORDER — WEGOVY 0.5 MG/0.5ML ~~LOC~~ SOAJ: 0.5000 mg | SUBCUTANEOUS | 0 refills | Status: DC

## 2022-07-07 NOTE — Progress Notes (Signed)
   WEIGHT SUMMARY AND BIOMETRICS  Weight Lost Since Last Visit: 3lb   No data recorded Anthropometric Measurements Height: 5\' 4"  (1.626 m) Weight: 164 lb (74.4 kg) BMI (Calculated): 28.14 Weight at Last Visit: 167lb Weight Lost Since Last Visit: 3lb Starting Weight: 179lb Total Weight Loss (lbs): 15 lb (6.804 kg)   Body Composition  Body Fat %: 35.1 % Fat Mass (lbs): 57.8 lbs Muscle Mass (lbs): 101.6 lbs Total Body Water (lbs): 73.6 lbs Visceral Fat Rating : 5   Other Clinical Data Fasting: yes Labs: no Today's Visit #: 8 Starting Date: 02/23/22    OBESITY Haley Salazar is here to discuss her progress with her obesity treatment plan along with follow-up of her obesity related diagnoses.     Nutrition Plan: the Category 3 plan - 90% adherence.  Current exercise: cardiovascular workout on exercise equipment and strength training.  Interim History:  She is down another 3 lbs since her last visit.  Eating all of the food on the plan., Is not skipping meals, and Meeting protein goals.  Pharmacotherapy: Lakya is on Wegovy 0.50 mg SQ weekly Adverse side effects: None Hunger is moderately controlled.  Cravings are moderately controlled.  Assessment/Plan:   1. Vitamin D insufficiency  Vitamin D is not at goal of 50.  Most recent vitamin D level was 29.0. She is on  prescription ergocalciferol 50,000 IU weekly. Lab Results  Component Value Date   VD25OH 29.0 (L) 02/23/2022   VD25OH 17.6 (L) 03/22/2021    Plan: Refill prescription vitamin D 50,000 IU weekly.   2. Polyphagia Polyphagia Haley Salazar endorses excessive hunger.  Medication(s): RUEAVW Effects of medication:  moderately controlled. Cravings are moderately controlled.   Plan: Medication(s): Wegovy 0.50 mg SQ weekly Will increase water, protein and fiber to help assuage hunger.  Will minimize foods that have a high glucose index/load to minimize reactive hypoglycemia.   Labs done today  (CMP, Lipids, HgbA1c, insulin, vitamin D, B 12).     Generalized Obesity: Current BMI BMI (Calculated): 28.14   Pharmacotherapy Plan Continue and refill  Wegovy 0.50 mg SQ weekly  Haley Salazar is currently in the action stage of change. As such, her goal is to continue with weight loss efforts.  She has agreed to the Category 3 plan.  Exercise goals: For substantial health benefits, adults should do at least 150 minutes (2 hours and 30 minutes) a week of moderate-intensity, or 75 minutes (1 hour and 15 minutes) a week of vigorous-intensity aerobic physical activity, or an equivalent combination of moderate- and vigorous-intensity aerobic activity. Aerobic activity should be performed in episodes of at least 10 minutes, and preferably, it should be spread throughout the week.  Behavioral modification strategies: increasing lean protein intake, no meal skipping, keep healthy foods in the home, and mindful eating.  Shanza has agreed to follow-up with our clinic in 4 weeks.       Objective:   VITALS: Per patient if applicable, see vitals. GENERAL: Alert and in no acute distress. CARDIOPULMONARY: No increased WOB. Speaking in clear sentences.  PSYCH: Pleasant and cooperative. Speech normal rate and rhythm. Affect is appropriate. Insight and judgement are appropriate. Attention is focused, linear, and appropriate.  NEURO: Oriented as arrived to appointment on time with no prompting.   Attestation Statements:    This was prepared with the assistance of Engineer, civil (consulting).  Occasional wrong-word or sound-a-like substitutions may have occurred due to the inherent limitations of voice recognition software.   Corinna Capra, DO

## 2022-07-08 LAB — VITAMIN D 25 HYDROXY (VIT D DEFICIENCY, FRACTURES): Vit D, 25-Hydroxy: 38.9 ng/mL (ref 30.0–100.0)

## 2022-07-08 LAB — COMPREHENSIVE METABOLIC PANEL
ALT: 9 IU/L (ref 0–32)
AST: 11 IU/L (ref 0–40)
Albumin: 4.4 g/dL (ref 4.0–5.0)
Alkaline Phosphatase: 50 IU/L (ref 44–121)
BUN/Creatinine Ratio: 10 (ref 9–23)
BUN: 8 mg/dL (ref 6–20)
Bilirubin Total: 0.4 mg/dL (ref 0.0–1.2)
CO2: 19 mmol/L — ABNORMAL LOW (ref 20–29)
Calcium: 9.3 mg/dL (ref 8.7–10.2)
Chloride: 108 mmol/L — ABNORMAL HIGH (ref 96–106)
Creatinine, Ser: 0.78 mg/dL (ref 0.57–1.00)
Globulin, Total: 2.9 g/dL (ref 1.5–4.5)
Glucose: 82 mg/dL (ref 70–99)
Potassium: 4.7 mmol/L (ref 3.5–5.2)
Sodium: 142 mmol/L (ref 134–144)
Total Protein: 7.3 g/dL (ref 6.0–8.5)
eGFR: 107 mL/min/{1.73_m2} (ref >59)

## 2022-07-08 LAB — LIPID PANEL WITH LDL/HDL RATIO
Cholesterol, Total: 193 mg/dL (ref 100–199)
HDL: 46 mg/dL (ref >39)
LDL Chol Calc (NIH): 117 mg/dL — ABNORMAL HIGH (ref 0–99)
LDL/HDL Ratio: 2.5 ratio (ref 0.0–3.2)
Triglycerides: 173 mg/dL — ABNORMAL HIGH (ref 0–149)
VLDL Cholesterol Cal: 30 mg/dL (ref 5–40)

## 2022-07-08 LAB — HEMOGLOBIN A1C
Est. average glucose Bld gHb Est-mCnc: 103 mg/dL
Hgb A1c MFr Bld: 5.2 % (ref 4.8–5.6)

## 2022-07-08 LAB — INSULIN, RANDOM: INSULIN: 16.3 u[IU]/mL (ref 2.6–24.9)

## 2022-07-08 LAB — VITAMIN B12: Vitamin B-12: 205 pg/mL — ABNORMAL LOW (ref 232–1245)

## 2022-07-11 ENCOUNTER — Encounter: Payer: Self-pay | Admitting: Registered Nurse

## 2022-07-11 ENCOUNTER — Other Ambulatory Visit: Payer: Self-pay | Admitting: Registered Nurse

## 2022-07-11 DIAGNOSIS — Z8639 Personal history of other endocrine, nutritional and metabolic disease: Secondary | ICD-10-CM

## 2022-07-11 DIAGNOSIS — E538 Deficiency of other specified B group vitamins: Secondary | ICD-10-CM

## 2022-07-11 DIAGNOSIS — E7800 Pure hypercholesterolemia, unspecified: Secondary | ICD-10-CM

## 2022-07-11 MED ORDER — CHOLECALCIFEROL 1.25 MG (50000 UT) PO TABS: 1.0000 | ORAL_TABLET | ORAL | 0 refills | Status: AC

## 2022-07-11 MED ORDER — VITAMIN B-12 1000 MCG PO TABS: 2000.0000 ug | ORAL_TABLET | Freq: Every day | ORAL | 0 refills | Status: DC

## 2022-07-11 NOTE — Progress Notes (Signed)
My chart message sent to patient [name], Please follow up with your PCM vitamin B12 low worsening from previous.  Are you taking a supplement or eating B12 rich foods?  Did you take all 90 pills daily?  If yes will increase you to 2000 units daily.    Your vitamin D now normal.  How many weeks of the Rx vitamin D 50,000 did you take?  Typically I have people switch to over the counter once in the normal range 2000 units daily ( ) cholecalciferol with meal and 15 minutes sunlight on skin daily but if you struggled to raise up your level previously with OTC then I will sent in another Rx for you to use in the winter. I did notice your [facility] provider would like your Vitamin D level to be 50 so I sent refill Rx to your pharmacy.  Recheck Vitamin D level per your [facility] provider.  Cholesterol triglycerides and LDL elevated similar to last year.  Risk ratio of coronary heart disease lower than average based on labs.   Consider adding 4 [place] nuts per month and 3 tablespoons chia seeds or 2 tablespoons ground flax seeds per day to your diet to help with cholesterol.Your blood sugar, electrolytes, kidney and liver function were normal.  See handouts on B12 deficiency, cholesterol, high fiber diet sent to your my chart.  I recommend rechecking your B12 level in 3-6 months after making diet/supplement changes.  Please see RN Bess Kinds if you want printed copy of results/handouts.  Please let me know if you have further questions or concerns. Sincerely, Albina Billet NP-C

## 2022-07-12 ENCOUNTER — Telehealth (INDEPENDENT_AMBULATORY_CARE_PROVIDER_SITE_OTHER): Payer: No Typology Code available for payment source | Admitting: Psychology

## 2022-07-12 DIAGNOSIS — F419 Anxiety disorder, unspecified: Secondary | ICD-10-CM | POA: Diagnosis not present

## 2022-07-12 DIAGNOSIS — F5089 Other specified eating disorder: Secondary | ICD-10-CM

## 2022-07-12 DIAGNOSIS — F32A Depression, unspecified: Secondary | ICD-10-CM

## 2022-07-12 DIAGNOSIS — F909 Attention-deficit hyperactivity disorder, unspecified type: Secondary | ICD-10-CM | POA: Diagnosis not present

## 2022-07-12 NOTE — Progress Notes (Signed)
  Office: [phone]  /  Fax: [phone]    Date: July 12, 2022  Appointment Start Time: 2:30pm Duration: 23 minutes Provider: Lawerance Cruel, Psy.D. Type of Session: Individual Therapy  Location of Patient: Home (private location) Location of Provider: Provider's Home (private office) Type of Contact: Telepsychological Visit via MyChart Video Visit  Session Content: Haley Salazar is a 27 y.o. female presenting for a follow-up appointment to address the previously established treatment goal of increasing coping skills.Today's appointment was a telepsychological visit. Haley Salazar provided verbal consent for today's telepsychological appointment and she is aware she is responsible for securing confidentiality on her end of the session. Prior to proceeding with today's appointment, Haley Salazar's physical location at the time of this appointment was obtained as well a phone number she could be reached at in the event of technical difficulties. Haley Salazar and this provider participated in today's telepsychological service.   Of note, today's appointment was switched to a regular telephone call at 2:32pm with Haley Salazar's verbal consent due to technical issues.   This provider conducted a brief check-in. Haley Salazar stated she is "okay" today and is scheduled for an appointment with her psychiatrist today. She added she is currently on a two week leave of absence. Notably, she increased frequency of appointments with her primary therapist. A risk assessment was completed. Haley Salazar denied experiencing suicidal, self-harm and homicidal ideation, plan, and intent since the last appointment with this provider. She described future plans that include ongoing efforts to improve her well-being and eating habits. Additionally, she discussed "overeating" due to being at home and "having nothing planned." Further explored and processed. Haley Salazar was engaged in problem solving to develop a plan to help cope with urges/cravings  involving activities to relax, activities to distract, comforting places, people to call and connect with, and activities that help soothe senses. Overall, Haley Salazar was receptive to today's appointment as evidenced by openness to sharing, responsiveness to feedback, and willingness to implement discussed strategies .  Mental Status Examination:  Appearance: neat Behavior: appropriate to circumstances Mood: anxious Affect: mood congruent Speech: WNL Eye Contact: appropriate Psychomotor Activity: WNL Gait: unable to assess Thought Process: linear, logical, and goal directed and denies suicidal, homicidal, and self-harm ideation, plan and intent  Thought Content/Perception: no hallucinations, delusions, bizarre thinking or behavior endorsed or observed Orientation: AAOx4 Memory/Concentration: memory, attention, language, and fund of knowledge intact  Insight: fair Judgment: fair  Interventions:  Conducted a brief chart review Conducted a risk assessment Provided empathic reflections and validation Employed supportive psychotherapy interventions to facilitate reduced distress and to improve coping skills with identified stressors Engaged patient in problem solving  DSM-5 Diagnosis(es): F50.89 Other Specified Feeding or Eating Disorder, Emotional Eating Behaviors, F90.9 Unspecified Attention-Deficit/Hyperactivity Disorder , F41.9 Unspecified Anxiety Disorder, and  F32.A Unspecified Depressive Disorder  Treatment Goal & Progress: During the initial appointment with this provider, the following treatment goal was established: increase coping skills. Haley Salazar has demonstrated progress in her goal as evidenced by increased awareness of hunger patterns and increased awareness of triggers for emotional eating behaviors. Haley Salazar also continues to demonstrate willingness to engage in learned skill(s).  Plan: The next appointment is scheduled for 08/02/2022 at 2:30pm, which will be via MyChart Video  Visit. The next session will focus on working towards the established treatment goal.

## 2022-08-02 ENCOUNTER — Telehealth (INDEPENDENT_AMBULATORY_CARE_PROVIDER_SITE_OTHER): Payer: No Typology Code available for payment source | Admitting: Psychology

## 2022-08-02 DIAGNOSIS — F419 Anxiety disorder, unspecified: Secondary | ICD-10-CM

## 2022-08-02 DIAGNOSIS — F32A Depression, unspecified: Secondary | ICD-10-CM | POA: Diagnosis not present

## 2022-08-02 DIAGNOSIS — F5089 Other specified eating disorder: Secondary | ICD-10-CM | POA: Diagnosis not present

## 2022-08-02 DIAGNOSIS — F909 Attention-deficit hyperactivity disorder, unspecified type: Secondary | ICD-10-CM | POA: Diagnosis not present

## 2022-08-02 NOTE — Progress Notes (Signed)
  Office: [phone]  /  Fax: [phone]    Date: August 02, 2022  Appointment Start Time: 2:33pm Duration: 24 minutes Provider: Lawerance Cruel, Psy.D. Type of Session: Individual Therapy  Location of Patient: Work (private location) Location of Provider: Provider's Home (private office) Type of Contact: Telepsychological Visit via MyChart Video Visit  Session Content: Haley Salazar is a 27 y.o. female presenting for a follow-up appointment to address the previously established treatment goal of increasing coping skills.Today's appointment was a telepsychological visit. Haley Salazar provided verbal consent for today's telepsychological appointment and she is aware she is responsible for securing confidentiality on her end of the session. Prior to proceeding with today's appointment, Lorah's physical location at the time of this appointment was obtained as well a phone number she could be reached at in the event of technical difficulties. Haley Salazar and this provider participated in today's telepsychological service.   This provider conducted a brief check-in. Haley Salazar shared, "Things have been okay."  She acknowledged challenges with her eating habits due to ongoing work-related stressors. Of note, she shared she was prescribed Wellbutrin by her psychiatric provider. She stated she is experiencing issues with sleep onset since starting Wellbutrin; therefore, she was encouraged to follow-up with her prescribing provider. A risk assessment was completed. Haley Salazar denied experiencing suicidal, self-harm and homicidal ideation, plan, and intent since the last appointment with this provider. She described future plans that include ongoing efforts to improve her well-being and eating habits. Remainder of today's appointment focused on eating habits further. It was reflected she is not eating enough. Psychoeducation regarding SMART goals was provided and Haley Salazar was engaged in goal setting. The following goal was  established: Haley Salazar will eat lunch and journal using the Lose It app at least 5 out of 7 days a week between now and the next appointment with this provider. Overall, Haley Salazar was receptive to today's appointment as evidenced by openness to sharing, responsiveness to feedback, and willingness to work toward the established SMART goal.  Mental Status Examination:  Appearance: neat Behavior: appropriate to circumstances Mood: anxious Affect: mood congruent Speech: WNL Eye Contact: appropriate Psychomotor Activity: WNL Gait: unable to assess Thought Process: linear, logical, and goal directed and denies suicidal, homicidal, and self-harm ideation, plan and intent  Thought Content/Perception: no hallucinations, delusions, bizarre thinking or behavior endorsed or observed Orientation: AAOx4 Memory/Concentration: memory, attention, language, and fund of knowledge intact  Insight: fair Judgment: fair  Interventions:  Conducted a risk assessment Reviewed content from the previous session Provided positive reinforcement Employed supportive psychotherapy interventions to facilitate reduced distress and to improve coping skills with identified stressors Engaged patient in goal setting Psychoeducation provided regarding SMART goals  DSM-5 Diagnosis(es):  F50.89 Other Specified Feeding or Eating Disorder, Emotional Eating Behaviors, F90.9 Unspecified Attention-Deficit/Hyperactivity Disorder , F41.9 Unspecified Anxiety Disorder, and  F32.A Unspecified Depressive Disorder  Treatment Goal & Progress: During the initial appointment with this provider, the following treatment goal was established: increase coping skills. Atha has demonstrated progress in her goal as evidenced by increased awareness of hunger patterns and increased awareness of triggers for emotional eating behaviors. Haley Salazar also continues to demonstrate willingness to engage in learned skill(s).  Plan: The next appointment is  scheduled for 09/05/2022 at 11:30am, which will be via MyChart Video Visit. The next session will focus on working towards the established treatment goal. Haley Salazar will continue with her primary therapist and follow-up with her psychiatric provider.

## 2022-08-04 ENCOUNTER — Encounter: Payer: Self-pay | Admitting: Nurse Practitioner

## 2022-08-04 ENCOUNTER — Ambulatory Visit: Payer: No Typology Code available for payment source | Admitting: Nurse Practitioner

## 2022-08-04 VITALS — BP 106/68 | HR 59 | Temp 98.0°F | Ht 64.0 in | Wt 161.0 lb

## 2022-08-04 DIAGNOSIS — K219 Gastro-esophageal reflux disease without esophagitis: Secondary | ICD-10-CM

## 2022-08-04 DIAGNOSIS — E538 Deficiency of other specified B group vitamins: Secondary | ICD-10-CM | POA: Diagnosis not present

## 2022-08-04 DIAGNOSIS — E7849 Other hyperlipidemia: Secondary | ICD-10-CM | POA: Diagnosis not present

## 2022-08-04 DIAGNOSIS — E559 Vitamin D deficiency, unspecified: Secondary | ICD-10-CM

## 2022-08-04 DIAGNOSIS — Z6827 Body mass index (BMI) 27.0-27.9, adult: Secondary | ICD-10-CM

## 2022-08-04 DIAGNOSIS — R632 Polyphagia: Secondary | ICD-10-CM | POA: Diagnosis not present

## 2022-08-04 DIAGNOSIS — E669 Obesity, unspecified: Secondary | ICD-10-CM

## 2022-08-04 MED ORDER — WEGOVY 0.5 MG/0.5ML ~~LOC~~ SOAJ: 0.5000 mg | SUBCUTANEOUS | 0 refills | Status: DC

## 2022-08-04 MED ORDER — OMEPRAZOLE MAGNESIUM 20 MG PO TBEC
20.0000 mg | DELAYED_RELEASE_TABLET | Freq: Every day | ORAL | 0 refills | Status: DC
Start: 2022-08-04 — End: 2022-11-02

## 2022-08-04 MED ORDER — ONDANSETRON HCL 4 MG PO TABS
4.0000 mg | ORAL_TABLET | Freq: Three times a day (TID) | ORAL | 0 refills | Status: DC | PRN
Start: 2022-08-04 — End: 2022-10-04

## 2022-08-04 NOTE — Progress Notes (Signed)
Office: [phone]  /  Fax: [phone]  WEIGHT SUMMARY AND BIOMETRICS  Weight Lost Since Last Visit: 3lb  Weight Gained Since Last Visit: 0lb   Vitals Temp: 98 F (36.7 C) BP: 106/68 Pulse Rate: (!) 59 SpO2: 98 %   Anthropometric Measurements Height: 5\' 4"  (1.626 m) Weight: 161 lb (73 kg) BMI (Calculated): 27.62 Weight at Last Visit: 164lb Weight Lost Since Last Visit: 3lb Weight Gained Since Last Visit: 0lb Starting Weight: 179lb Total Weight Loss (lbs): 18 lb (8.165 kg)   Body Composition  Body Fat %: 34.3 % Fat Mass (lbs): 55.4 lbs Muscle Mass (lbs): 100.6 lbs Total Body Water (lbs): 72.6 lbs Visceral Fat Rating : 4   Other Clinical Data Fasting: Yes Labs: No Today's Visit #: 9 Starting Date: 02/23/22     HPI  Chief Complaint: OBESITY  Haley Salazar is here to discuss her progress with her obesity treatment plan. She is on the keeping a food journal and adhering to recommended goals of 1700 calories and 80+ protein and states she is following her eating plan approximately 50 % of the time. She states she is exercising 0 minutes 0 days per week.   Interval History:  Since last office visit she has lost 3 pounds.  She took 2 weeks off from work since her last visit.     Pharmacotherapy for weight loss: She is currently taking Wegovy 0.5mg  for medical weight loss every 10 days.  Reports side effects of nausea/vomiting/diarrhea for the first couple days after taking the injection.  Helps with polyphagia and cravings.   Vit B12 def She started taking Vit B12 2,000 mcg after her last labs on 07/07/22. Denies side effectxs    Vit D deficiency  She is taking Vit D 50,000 IU weekly.  Denies side effects.  Denies nausea, vomiting or muscle weakness.    Lab Results  Component Value Date   VD25OH 38.9 07/07/2022   VD25OH 29.0 (L) 02/23/2022   VD25OH 17.6 (L) 03/22/2021    Hyperlipidemia Medication(s): none.  FH:  mother with HLD  Lab Results   Component Value Date   CHOL 193 07/07/2022   HDL 46 07/07/2022   LDLCALC 117 (H) 07/07/2022   LDLDIRECT 122.0 06/22/2020   TRIG 173 (H) 07/07/2022   CHOLHDL 4.4 03/22/2021   Lab Results  Component Value Date   ALT 9 07/07/2022   AST 11 07/07/2022   GGT 9 03/22/2021   ALKPHOS 50 07/07/2022   BILITOT 0.4 07/07/2022   The ASCVD Risk score (Arnett DK, et al., 2019) failed to calculate for the following reasons:   The 2019 ASCVD risk score is only valid for ages 16 to 31   PHYSICAL EXAM:  Blood pressure 106/68, pulse (!) 59, temperature 98 F (36.7 C), height 5\' 4"  (1.626 m), weight 161 lb (73 kg), SpO2 98%. Body mass index is 27.64 kg/m.  General: She is overweight, cooperative, alert, well developed, and in no acute distress. PSYCH: Has normal mood, affect and thought process.   Extremities: No edema.  Neurologic: No gross sensory or motor deficits. No tremors or fasciculations noted.    DIAGNOSTIC DATA REVIEWED:  BMET    Component Value Date/Time   NA 142 07/07/2022 0750   K 4.7 07/07/2022 0750   CL 108 (H) 07/07/2022 0750   CO2 19 (L) 07/07/2022 0750   GLUCOSE 82 07/07/2022 0750   GLUCOSE 96 06/22/2020 1357   BUN 8 07/07/2022 0750   CREATININE 0.78 07/07/2022 0750  CREATININE 0.67 10/29/2019 0910   CALCIUM 9.3 07/07/2022 0750   GFRNONAA 124 10/29/2019 0910   GFRAA 144 10/29/2019 0910   Lab Results  Component Value Date   HGBA1C 5.2 07/07/2022   HGBA1C 5.7 06/22/2020   Lab Results  Component Value Date   INSULIN 16.3 07/07/2022   INSULIN 13.7 02/23/2022   Lab Results  Component Value Date   TSH 1.210 02/23/2022   CBC    Component Value Date/Time   WBC 7.2 03/22/2021 0930   WBC 9.4 06/22/2020 1357   RBC 4.56 03/22/2021 0930   RBC 4.41 06/22/2020 1357   HGB 13.3 03/22/2021 0930   HCT 39.6 02/23/2022 0952   PLT 294 03/22/2021 0930   MCV 89 03/22/2021 0930   MCH 29.2 03/22/2021 0930   MCH 29.2 10/29/2019 0910   MCHC 32.8 03/22/2021 0930   MCHC  33.7 06/22/2020 1357   RDW 12.1 03/22/2021 0930   Iron Studies    Component Value Date/Time   IRON 105 02/23/2022 0952   TIBC 422 02/23/2022 0952   FERRITIN 87 02/23/2022 0952   IRONPCTSAT 25 02/23/2022 0952   Lipid Panel     Component Value Date/Time   CHOL 193 07/07/2022 0750   TRIG 173 (H) 07/07/2022 0750   HDL 46 07/07/2022 0750   CHOLHDL 4.4 03/22/2021 0930   CHOLHDL 4 06/22/2020 1357   VLDL 40.2 (H) 06/22/2020 1357   LDLCALC 117 (H) 07/07/2022 0750   LDLCALC 141 (H) 10/29/2019 0910   LDLDIRECT 122.0 06/22/2020 1357   Hepatic Function Panel     Component Value Date/Time   PROT 7.3 07/07/2022 0750   ALBUMIN 4.4 07/07/2022 0750   AST 11 07/07/2022 0750   ALT 9 07/07/2022 0750   ALKPHOS 50 07/07/2022 0750   BILITOT 0.4 07/07/2022 0750      Component Value Date/Time   TSH 1.210 02/23/2022 0952   Nutritional Lab Results  Component Value Date   VD25OH 38.9 07/07/2022   VD25OH 29.0 (L) 02/23/2022   VD25OH 17.6 (L) 03/22/2021     ASSESSMENT AND PLAN  TREATMENT PLAN FOR OBESITY:  Recommended Dietary Goals  Haley Salazar is currently in the action stage of change. As such, her goal is to continue weight management plan. She has agreed to keeping a food journal and adhering to recommended goals of 1700 calories and 80+ protein.  Behavioral Intervention  We discussed the following Behavioral Modification Strategies today: increasing lean protein intake, decreasing simple carbohydrates , increasing vegetables, increasing lower glycemic fruits, increasing fiber rich foods, avoiding skipping meals, increasing water intake, work on meal planning and preparation, keeping healthy foods at home, continue to practice mindfulness when eating, and planning for success.  Additional resources provided today: NA  Recommended Physical Activity Goals  Haley Salazar has been advised to work up to 150 minutes of moderate intensity aerobic activity a week and strengthening exercises 2-3  times per week for cardiovascular health, weight loss maintenance and preservation of muscle mass.   She has agreed to Think about ways to increase daily physical activity and overcoming barriers to exercise   Pharmacotherapy We discussed various medication options to help Haley Salazar with her weight loss efforts and we both agreed to continue [facility] 0.5mg  due to side effects.  Side effects discussed.  ASSOCIATED CONDITIONS ADDRESSED TODAY  Action/Plan  B12 deficiency Continue Vit B12 OTC.  Will continue to monitor.    Polyphagia -     Ondansetron HCl; Take 1 tablet (4 mg total) by mouth every  8 (eight) hours as needed for nausea or vomiting.  Dispense: 20 tablet; Refill: 0 -     Wegovy; Inject 0.5 mg into the skin once a week.  Dispense: 2 mL; Refill: 0  Other hyperlipidemia Cardiovascular risk and specific lipid/LDL goals reviewed.  We discussed several lifestyle modifications today and Haley Salazar will continue to work on diet, exercise and weight loss efforts. Orders and follow up as documented in patient record.   Counseling Intensive lifestyle modifications are the first line treatment for this issue. Dietary changes: Increase soluble fiber. Decrease simple carbohydrates. Exercise changes: Moderate to vigorous-intensity aerobic activity 150 minutes per week if tolerated. Lipid-lowering medications: see documented in medical record.   Gastroesophageal reflux disease without esophagitis -     Omeprazole Magnesium; Take 1 tablet (20 mg total) by mouth daily.  Dispense: 30 tablet; Refill: 0  Vitamin D insufficiency Continue Vit D as directed  Low Vitamin D level contributes to fatigue and are associated with obesity, breast, and colon cancer. She agrees to continue to take prescription Vitamin D @50 ,000 IU every week and will follow-up for routine testing of Vitamin D, at least 2-3 times per year to avoid over-replacement.   Generalized obesity -     [id number]; Inject 0.5 mg into the  skin once a week.  Dispense: 2 mL; Refill: 0  BMI 27.0-27.9,adult Continue Wegovy as directed.   Labs reviewed in chart from 07/07/22   Return in about 4 weeks (around 09/01/2022).Marland Kitchen She was informed of the importance of frequent follow up visits to maximize her success with intensive lifestyle modifications for her multiple health conditions.   ATTESTASTION STATEMENTS:  Reviewed by clinician on day of visit: allergies, medications, problem list, medical history, surgical history, family history, social history, and previous encounter notes.     Theodis Sato. Donni Oglesby FNP-C

## 2022-08-07 NOTE — Progress Notes (Signed)
Per epic patient reviewed results/instructions on 07/21/22 spoke with patient in [facility] 07/28/22 and she stated she had taken tablets then stopped.  She restarted B12 tablets daily this week  Discussed retesting blood level in 3-6 months.  Discussed B12 if high or low can cause nerve problems.  Patient stated she had been having symptoms in arms/legs this spring. Notify her PCM or clinic staff if paresthesias worsening/not improving with Vitamin D and B12 supplement.   Discussed if levels high or low chronically can cause permanent nerve damage/problems and recommended she take her supplement consistently and have levels rechecked until stabilized in normal range  Discussed vitamin D level normal and to continue 2000 units daily with meal supplement per current recommendations childbearing age women  Discussed cholesterol/dietary changes that may improve lipids along with exercise and dietary fiber.    Patient plans to follow up with her PCM.  Patient verbalized understanding information/instructions, agreed with plan of care and had no further questions at that time.

## 2022-08-09 ENCOUNTER — Other Ambulatory Visit: Payer: Self-pay | Admitting: Nurse Practitioner

## 2022-08-09 ENCOUNTER — Encounter: Payer: Self-pay | Admitting: Nurse Practitioner

## 2022-08-09 DIAGNOSIS — E669 Obesity, unspecified: Secondary | ICD-10-CM

## 2022-08-09 MED ORDER — WEGOVY 1 MG/0.5ML ~~LOC~~ SOAJ
1.0000 mg | SUBCUTANEOUS | 0 refills | Status: DC
Start: 2022-08-09 — End: 2022-11-02

## 2022-08-26 ENCOUNTER — Other Ambulatory Visit: Payer: Self-pay | Admitting: Nurse Practitioner

## 2022-08-26 DIAGNOSIS — K219 Gastro-esophageal reflux disease without esophagitis: Secondary | ICD-10-CM

## 2022-09-05 ENCOUNTER — Telehealth (INDEPENDENT_AMBULATORY_CARE_PROVIDER_SITE_OTHER): Payer: No Typology Code available for payment source | Admitting: Psychology

## 2022-09-05 DIAGNOSIS — F419 Anxiety disorder, unspecified: Secondary | ICD-10-CM | POA: Diagnosis not present

## 2022-09-05 DIAGNOSIS — F5089 Other specified eating disorder: Secondary | ICD-10-CM

## 2022-09-05 DIAGNOSIS — F909 Attention-deficit hyperactivity disorder, unspecified type: Secondary | ICD-10-CM | POA: Diagnosis not present

## 2022-09-05 DIAGNOSIS — F32A Depression, unspecified: Secondary | ICD-10-CM | POA: Diagnosis not present

## 2022-09-05 NOTE — Progress Notes (Signed)
  Office: [phone]  /  Fax: [phone]    Date: September 05, 2022  Appointment Start Time: 11:37am Duration: 24 minutes Provider: Lawerance Cruel, Psy.D. Type of Session: Individual Therapy  Location of Patient: Work (private location) Location of Provider: Provider's Home (private office) Type of Contact: Telepsychological Visit via MyChart Video Visit  Session Content: Haley Salazar is a 27 y.o. female presenting for a follow-up appointment to address the previously established treatment goal of increasing coping skills.Today's appointment was a telepsychological visit. Haley Salazar provided verbal consent for today's telepsychological appointment and she is aware she is responsible for securing confidentiality on her end of the session. Prior to proceeding with today's appointment, Audelia's physical location at the time of this appointment was obtained as well a phone number she could be reached at in the event of technical difficulties. Haley Salazar and this provider participated in today's telepsychological service.   This provider conducted a brief check-in. Haley Salazar shared she is "okay," adding "I haven't met my SMART goal." She also shared about recent events, adding she meal prepped for this week. Psychoeducation provided regarding mindful eating strategies (sit down, slowly chew, savor, simplify, and smile). Reviewed this provider's role with the clinic. Overall, Haley Salazar was receptive to today's appointment as evidenced by openness to sharing, responsiveness to feedback, and willingness to implement discussed strategies .  Mental Status Examination:  Appearance: neat Behavior: appropriate to circumstances Mood: neutral Affect: mood congruent Speech: WNL Eye Contact: appropriate Psychomotor Activity: WNL Gait: unable to assess Thought Process: linear, logical, and goal directed and denies suicidal, homicidal, and self-harm ideation, plan and intent  Thought Content/Perception: no  hallucinations, delusions, bizarre thinking or behavior endorsed or observed Orientation: AAOx4 Memory/Concentration: intact Insight: fair Judgment: fair  Interventions:  Conducted a brief chart review Conducted a risk assessment Provided empathic reflections and validation Employed supportive psychotherapy interventions to facilitate reduced distress and to improve coping skills with identified stressors Psychoeducation provided regarding mindfulness  DSM-5 Diagnosis(es):  F50.89 Other Specified Feeding or Eating Disorder, Emotional Eating Behaviors, F90.9 Unspecified Attention-Deficit/Hyperactivity Disorder , F41.9 Unspecified Anxiety Disorder, and  F32.A Unspecified Depressive Disorder  Treatment Goal & Progress: During the initial appointment with this provider, the following treatment goal was established: increase coping skills. Haley Salazar has demonstrated progress in her goal as evidenced by increased awareness of hunger patterns and increased awareness of triggers for emotional eating behaviors. Haley Salazar also continues to demonstrate willingness to engage in learned skill(s).  Plan: The next appointment is scheduled for 10/03/2022 at 11:30am, which will be via MyChart Video Visit. The next session will focus on working towards the established treatment goal. Haley Salazar will continue with her primary therapist, and noted a plan to increase frequency of appointments to two times a week.

## 2022-09-06 ENCOUNTER — Ambulatory Visit: Payer: No Typology Code available for payment source | Admitting: Nurse Practitioner

## 2022-09-06 ENCOUNTER — Encounter: Payer: Self-pay | Admitting: Nurse Practitioner

## 2022-09-06 VITALS — BP 114/81 | HR 90 | Temp 98.2°F | Ht 64.0 in | Wt 162.0 lb

## 2022-09-06 DIAGNOSIS — R632 Polyphagia: Secondary | ICD-10-CM | POA: Diagnosis not present

## 2022-09-06 DIAGNOSIS — Z6827 Body mass index (BMI) 27.0-27.9, adult: Secondary | ICD-10-CM

## 2022-09-06 DIAGNOSIS — E669 Obesity, unspecified: Secondary | ICD-10-CM | POA: Diagnosis not present

## 2022-09-06 MED ORDER — WEGOVY 1.7 MG/0.75ML ~~LOC~~ SOAJ
1.7000 mg | SUBCUTANEOUS | 0 refills | Status: DC
Start: 2022-09-06 — End: 2022-11-02

## 2022-09-06 NOTE — Progress Notes (Signed)
Office: [phone]  /  Fax: [phone]  WEIGHT SUMMARY AND BIOMETRICS  Weight Lost Since Last Visit: 0lb  Weight Gained Since Last Visit: 1lb   Vitals Temp: 98.2 F (36.8 C) BP: 114/81 Pulse Rate: 90 SpO2: 95 %   Anthropometric Measurements Height: 5\' 4"  (1.626 m) Weight: 162 lb (73.5 kg) BMI (Calculated): 27.79 Weight at Last Visit: 161lb Weight Lost Since Last Visit: 0lb Weight Gained Since Last Visit: 1lb Starting Weight: 179lb Total Weight Loss (lbs): 17 lb (7.711 kg)   Body Composition  Body Fat %: 34.8 % Fat Mass (lbs): 56.4 lbs Muscle Mass (lbs): 100.2 lbs Total Body Water (lbs): 75.8 lbs Visceral Fat Rating : 4   Other Clinical Data Fasting: No Labs: No Today's Visit #: 10 Starting Date: 02/23/22     HPI  Chief Complaint: OBESITY  Haley Salazar is here to discuss her progress with her obesity treatment plan. She is on the keeping a food journal and adhering to recommended goals of 1700 calories and 80+ protein and states she is following her eating plan approximately 75 % of the time. She states she is exercising 60 minutes 3-4 days per week.   Interval History:  Since last office visit she gained one pound.  She has been under some stress since her last visit.  She is doing well with the plan.      Pharmacotherapy for weight loss: She is currently taking Wegovy 1mg  for medical weight loss.  Occ side effects of nausea.  Takes Zofran PRN. Has helped with polyphagia and cravings.   Previous pharmacotherapy for medical weight loss:  none  Bariatric surgery:  Patient has not had bariatric surgery   PHYSICAL EXAM:  Blood pressure 114/81, pulse 90, temperature 98.2 F (36.8 C), height 5\' 4"  (1.626 m), weight 162 lb (73.5 kg), SpO2 95%. Body mass index is 27.81 kg/m.  General: She is overweight, cooperative, alert, well developed, and in no acute distress. PSYCH: Has normal mood, affect and thought process.   Extremities: No edema.   Neurologic: No gross sensory or motor deficits. No tremors or fasciculations noted.    DIAGNOSTIC DATA REVIEWED:  BMET    Component Value Date/Time   NA 142 07/07/2022 0750   K 4.7 07/07/2022 0750   CL 108 (H) 07/07/2022 0750   CO2 19 (L) 07/07/2022 0750   GLUCOSE 82 07/07/2022 0750   GLUCOSE 96 06/22/2020 1357   BUN 8 07/07/2022 0750   CREATININE 0.78 07/07/2022 0750   CREATININE 0.67 10/29/2019 0910   CALCIUM 9.3 07/07/2022 0750   GFRNONAA 124 10/29/2019 0910   GFRAA 144 10/29/2019 0910   Lab Results  Component Value Date   HGBA1C 5.2 07/07/2022   HGBA1C 5.7 06/22/2020   Lab Results  Component Value Date   INSULIN 16.3 07/07/2022   INSULIN 13.7 02/23/2022   Lab Results  Component Value Date   TSH 1.210 02/23/2022   CBC    Component Value Date/Time   WBC 7.2 03/22/2021 0930   WBC 9.4 06/22/2020 1357   RBC 4.56 03/22/2021 0930   RBC 4.41 06/22/2020 1357   HGB 13.3 03/22/2021 0930   HCT 39.6 02/23/2022 0952   PLT 294 03/22/2021 0930   MCV 89 03/22/2021 0930   MCH 29.2 03/22/2021 0930   MCH 29.2 10/29/2019 0910   MCHC 32.8 03/22/2021 0930   MCHC 33.7 06/22/2020 1357   RDW 12.1 03/22/2021 0930   Iron Studies    Component Value Date/Time   IRON 105  02/23/2022 0952   TIBC 422 02/23/2022 0952   FERRITIN 87 02/23/2022 0952   IRONPCTSAT 25 02/23/2022 0952   Lipid Panel     Component Value Date/Time   CHOL 193 07/07/2022 0750   TRIG 173 (H) 07/07/2022 0750   HDL 46 07/07/2022 0750   CHOLHDL 4.4 03/22/2021 0930   CHOLHDL 4 06/22/2020 1357   VLDL 40.2 (H) 06/22/2020 1357   LDLCALC 117 (H) 07/07/2022 0750   LDLCALC 141 (H) 10/29/2019 0910   LDLDIRECT 122.0 06/22/2020 1357   Hepatic Function Panel     Component Value Date/Time   PROT 7.3 07/07/2022 0750   ALBUMIN 4.4 07/07/2022 0750   AST 11 07/07/2022 0750   ALT 9 07/07/2022 0750   ALKPHOS 50 07/07/2022 0750   BILITOT 0.4 07/07/2022 0750      Component Value Date/Time   TSH 1.210 02/23/2022  0952   Nutritional Lab Results  Component Value Date   VD25OH 38.9 07/07/2022   VD25OH 29.0 (L) 02/23/2022   VD25OH 17.6 (L) 03/22/2021     ASSESSMENT AND PLAN  TREATMENT PLAN FOR OBESITY:  Recommended Dietary Goals  Haley Salazar is currently in the action stage of change. As such, her goal is to continue weight management plan. She has agreed to keeping a food journal and adhering to recommended goals of 1700 calories and 90+ protein.  Behavioral Intervention  We discussed the following Behavioral Modification Strategies today: increasing lean protein intake, decreasing simple carbohydrates , increasing vegetables, increasing lower glycemic fruits, increasing water intake, reading food labels , keeping healthy foods at home, continue to practice mindfulness when eating, and planning for success.  Additional resources provided today: NA  Recommended Physical Activity Goals  Haley Salazar has been advised to work up to 150 minutes of moderate intensity aerobic activity a week and strengthening exercises 2-3 times per week for cardiovascular health, weight loss maintenance and preservation of muscle mass.   She has agreed to Continue current level of physical activity    Pharmacotherapy We discussed various medication options to help Haley Salazar with her weight loss efforts and we both agreed to continue [facility] 1mg  for 2 weeks and then increase to 1.7mg .  side effects discussed.  ASSOCIATED CONDITIONS ADDRESSED TODAY  Action/Plan  Polyphagia -     ZOXWRU; Inject 1.7 mg into the skin once a week.  Dispense: 3 mL; Refill: 0  Generalized obesity -     EAVWUJ; Inject 1.7 mg into the skin once a week.  Dispense: 3 mL; Refill: 0  BMI 27.0-27.9,adult      Will obtain labs at next visit.    Return in about 4 weeks (around 10/04/2022).Marland Kitchen She was informed of the importance of frequent follow up visits to maximize her success with intensive lifestyle modifications for her multiple health  conditions.   ATTESTASTION STATEMENTS:  Reviewed by clinician on day of visit: allergies, medications, problem list, medical history, surgical history, family history, social history, and previous encounter notes.     Theodis Sato. Alyrica Thurow FNP-C

## 2022-10-03 ENCOUNTER — Telehealth (INDEPENDENT_AMBULATORY_CARE_PROVIDER_SITE_OTHER): Payer: No Typology Code available for payment source | Admitting: Psychology

## 2022-10-03 DIAGNOSIS — F419 Anxiety disorder, unspecified: Secondary | ICD-10-CM

## 2022-10-03 DIAGNOSIS — F909 Attention-deficit hyperactivity disorder, unspecified type: Secondary | ICD-10-CM

## 2022-10-03 DIAGNOSIS — F32A Depression, unspecified: Secondary | ICD-10-CM

## 2022-10-03 DIAGNOSIS — F5089 Other specified eating disorder: Secondary | ICD-10-CM

## 2022-10-03 NOTE — Progress Notes (Signed)
  Office: [phone]  /  Fax: [phone]    Date: October 03, 2022  Appointment Start Time: 11:34am Duration: 21 minutes Provider: Lawerance Cruel, Psy.D. Type of Session: Individual Therapy  Location of Patient: Work (private location) Location of Provider: Provider's Home (private office) Type of Contact: Telepsychological Visit via MyChart Video Visit  Session Content: Haley Salazar is a 27 y.o. female presenting for a follow-up appointment to address the previously established treatment goal of increasing coping skills.Today's appointment was a telepsychological visit. Haley Salazar provided verbal consent for today's telepsychological appointment and she is aware she is responsible for securing confidentiality on her end of the session. Prior to proceeding with today's appointment, Haley Salazar's physical location at the time of this appointment was obtained as well a phone number she could be reached at in the event of technical difficulties. Haley Salazar and this provider participated in today's telepsychological service.   This provider conducted a brief check-in. Haley Salazar shared, "Things have been pretty good." She described implementing previously discussed mindful eating strategies. Reflected on what helped her get back on track (e.g., consistently grocery shopping; journaling thoughts/feelings; organizing), and discussed ways to continue the aforementioned. She also described a reduction in emotional eating behaviors. Notably, she reported she ran out of Zofran resulting in nausea/vomiting when she is eating. She agreed to eat smaller/frequent meals and discussing further with prescribing provider. Overall, Haley Salazar was receptive to today's appointment as evidenced by openness to sharing, responsiveness to feedback, and willingness to implement discussed strategies .  Mental Status Examination:  Appearance: neat Behavior: appropriate to circumstances Mood: neutral Affect: mood congruent Speech:  WNL Eye Contact: appropriate Psychomotor Activity: WNL Gait: unable to assess Thought Process: linear, logical, and goal directed and denies suicidal, homicidal, and self-harm ideation, plan and intent  Thought Content/Perception: no hallucinations, delusions, bizarre thinking or behavior endorsed or observed Orientation: AAOx4 Memory/Concentration: intact Insight: good Judgment: good  Interventions:  Conducted a brief chart review Conducted a risk assessment Provided empathic reflections and validation Reviewed content from the previous session Provided positive reinforcement Employed supportive psychotherapy interventions to facilitate reduced distress and to improve coping skills with identified stressors Engaged pt in problem solving  DSM-5 Diagnosis(es):  F50.89 Other Specified Feeding or Eating Disorder, Emotional Eating Behaviors, F90.9 Unspecified Attention-Deficit/Hyperactivity Disorder , F41.9 Unspecified Anxiety Disorder, and  F32.A Unspecified Depressive Disorder  Treatment Goal & Progress: During the initial appointment with this provider, the following treatment goal was established: increase coping skills. Haley Salazar has demonstrated progress in her goal as evidenced by increased awareness of hunger patterns, increased awareness of triggers for emotional eating behaviors, and decreased engagement in emotional eating behaviors. Haley Salazar also continues to demonstrate willingness to engage in learned skill(s).  Plan: The next appointment is scheduled for 10/25/2022 at 12pm, which will be via MyChart Video Visit. The next session will focus on working towards the established treatment goal and termination planning. Haley Salazar will continue with her primary therapist.

## 2022-10-04 ENCOUNTER — Encounter: Payer: Self-pay | Admitting: Nurse Practitioner

## 2022-10-04 ENCOUNTER — Other Ambulatory Visit: Payer: Self-pay | Admitting: Registered Nurse

## 2022-10-04 ENCOUNTER — Encounter: Payer: Self-pay | Admitting: Registered Nurse

## 2022-10-04 ENCOUNTER — Ambulatory Visit: Payer: No Typology Code available for payment source | Admitting: Nurse Practitioner

## 2022-10-04 VITALS — BP 106/75 | HR 95 | Temp 98.2°F | Ht 64.0 in | Wt 159.0 lb

## 2022-10-04 DIAGNOSIS — Z79899 Other long term (current) drug therapy: Secondary | ICD-10-CM

## 2022-10-04 DIAGNOSIS — E538 Deficiency of other specified B group vitamins: Secondary | ICD-10-CM | POA: Diagnosis not present

## 2022-10-04 DIAGNOSIS — R112 Nausea with vomiting, unspecified: Secondary | ICD-10-CM | POA: Diagnosis not present

## 2022-10-04 DIAGNOSIS — E559 Vitamin D deficiency, unspecified: Secondary | ICD-10-CM

## 2022-10-04 DIAGNOSIS — Z6827 Body mass index (BMI) 27.0-27.9, adult: Secondary | ICD-10-CM

## 2022-10-04 DIAGNOSIS — R109 Unspecified abdominal pain: Secondary | ICD-10-CM

## 2022-10-04 DIAGNOSIS — E669 Obesity, unspecified: Secondary | ICD-10-CM

## 2022-10-04 MED ORDER — ONDANSETRON HCL 4 MG PO TABS
4.0000 mg | ORAL_TABLET | Freq: Three times a day (TID) | ORAL | 0 refills | Status: DC | PRN
Start: 2022-10-04 — End: 2022-12-26

## 2022-10-04 NOTE — Progress Notes (Signed)
Office: [phone]  /  Fax: [phone]  WEIGHT SUMMARY AND BIOMETRICS  Weight Lost Since Last Visit: 3lb  Weight Gained Since Last Visit: 0lb   Vitals Temp: 98.2 F (36.8 C) BP: 106/75 Pulse Rate: 95 SpO2: 96 %   Anthropometric Measurements Height: 5\' 4"  (1.626 m) Weight: 159 lb (72.1 kg) BMI (Calculated): 27.28 Weight at Last Visit: 162lb Weight Lost Since Last Visit: 3lb Weight Gained Since Last Visit: 0lb Starting Weight: 179lb Total Weight Loss (lbs): 20 lb (9.072 kg)   Body Composition  Body Fat %: 32.6 % Fat Mass (lbs): 51.8 lbs Muscle Mass (lbs): 101.8 lbs Total Body Water (lbs): 70.2 lbs Visceral Fat Rating : 4   Other Clinical Data Fasting: Yes Labs: No Today's Visit #: 11 Starting Date: 02/23/22     HPI  Chief Complaint: OBESITY  Haley Salazar is here to discuss her progress with her obesity treatment plan. She is on the keeping a food journal and adhering to recommended goals of 1700 calories and 80+ protein and states she is following her eating plan approximately 85 % of the time. She states she is exercising 60 minutes 3 days per week.   Interval History:  Since last office visit she has lost 3 pounds.     Pharmacotherapy for weight loss: She is currently taking Wegovy 1 mg for medical weight loss every 10 days.   Reports side effects of nausea, diarrhea, abd pain/discomfort and vomiting since her last injection on Saturday  Taking OTC meds without relief.  Doesn't have any Zofran left.  She hasn't been able to follow the plan due to nausea and vomiting.  Denies polyphagia with GNFAOZ.  Reports cravings.    Previous pharmacotherapy for medical weight loss:  none  Bariatric surgery:  Has not had bariatric surgery.   Vit B12 def She started taking Vit B12 2,000 mcg after her last labs on 07/07/22. Denies side effects     Vit D deficiency  She is taking Vit D 50,000 IU weekly.  Denies side effects.  Denies muscle weakness.    PHYSICAL  EXAM:  Blood pressure 106/75, pulse 95, temperature 98.2 F (36.8 C), height 5\' 4"  (1.626 m), weight 159 lb (72.1 kg), SpO2 96%. Body mass index is 27.29 kg/m.  General: She is overweight, cooperative, alert, well developed, and in no acute distress. PSYCH: Has normal mood, affect and thought process.   Extremities: No edema.  Neurologic: No gross sensory or motor deficits. No tremors or fasciculations noted.    DIAGNOSTIC DATA REVIEWED:  BMET    Component Value Date/Time   NA 142 07/07/2022 0750   K 4.7 07/07/2022 0750   CL 108 (H) 07/07/2022 0750   CO2 19 (L) 07/07/2022 0750   GLUCOSE 82 07/07/2022 0750   GLUCOSE 96 06/22/2020 1357   BUN 8 07/07/2022 0750   CREATININE 0.78 07/07/2022 0750   CREATININE 0.67 10/29/2019 0910   CALCIUM 9.3 07/07/2022 0750   GFRNONAA 124 10/29/2019 0910   GFRAA 144 10/29/2019 0910   Lab Results  Component Value Date   HGBA1C 5.2 07/07/2022   HGBA1C 5.7 06/22/2020   Lab Results  Component Value Date   INSULIN 16.3 07/07/2022   INSULIN 13.7 02/23/2022   Lab Results  Component Value Date   TSH 1.210 02/23/2022   CBC    Component Value Date/Time   WBC 7.2 03/22/2021 0930   WBC 9.4 06/22/2020 1357   RBC 4.56 03/22/2021 0930   RBC 4.41 06/22/2020 1357  HGB 13.3 03/22/2021 0930   HCT 39.6 02/23/2022 0952   PLT 294 03/22/2021 0930   MCV 89 03/22/2021 0930   MCH 29.2 03/22/2021 0930   MCH 29.2 10/29/2019 0910   MCHC 32.8 03/22/2021 0930   MCHC 33.7 06/22/2020 1357   RDW 12.1 03/22/2021 0930   Iron Studies    Component Value Date/Time   IRON 105 02/23/2022 0952   TIBC 422 02/23/2022 0952   FERRITIN 87 02/23/2022 0952   IRONPCTSAT 25 02/23/2022 0952   Lipid Panel     Component Value Date/Time   CHOL 193 07/07/2022 0750   TRIG 173 (H) 07/07/2022 0750   HDL 46 07/07/2022 0750   CHOLHDL 4.4 03/22/2021 0930   CHOLHDL 4 06/22/2020 1357   VLDL 40.2 (H) 06/22/2020 1357   LDLCALC 117 (H) 07/07/2022 0750   LDLCALC 141 (H)  10/29/2019 0910   LDLDIRECT 122.0 06/22/2020 1357   Hepatic Function Panel     Component Value Date/Time   PROT 7.3 07/07/2022 0750   ALBUMIN 4.4 07/07/2022 0750   AST 11 07/07/2022 0750   ALT 9 07/07/2022 0750   ALKPHOS 50 07/07/2022 0750   BILITOT 0.4 07/07/2022 0750      Component Value Date/Time   TSH 1.210 02/23/2022 0952   Nutritional Lab Results  Component Value Date   VD25OH 38.9 07/07/2022   VD25OH 29.0 (L) 02/23/2022   VD25OH 17.6 (L) 03/22/2021     ASSESSMENT AND PLAN  TREATMENT PLAN FOR OBESITY:  Recommended Dietary Goals  Haley Salazar is currently in the action stage of change. As such, her goal is to continue weight management plan. She has agreed to keeping a food journal and adhering to recommended goals of 1700 calories and 80+ protein.  Behavioral Intervention  We discussed the following Behavioral Modification Strategies today: increasing lean protein intake, decreasing simple carbohydrates , increasing vegetables, increasing lower glycemic fruits, increasing water intake, work on meal planning and preparation, work on tracking and journaling calories using tracking application, reading food labels , keeping healthy foods at home, continue to practice mindfulness when eating, and planning for success.  Additional resources provided today: NA  Recommended Physical Activity Goals  [name] has been advised to work up to 150 minutes of moderate intensity aerobic activity a week and strengthening exercises 2-3 times per week for cardiovascular health, weight loss maintenance and preservation of muscle mass.   She has agreed to Continue current level of physical activity    Pharmacotherapy We discussed various medication options to help [name] with her weight loss efforts and we both agreed to stop [name].  ASSOCIATED CONDITIONS ADDRESSED TODAY  Action/Plan  B12 deficiency -     Vitamin B12  Continue Vit B12  Vitamin D deficiency -     VITAMIN  D 25 Hydroxy (Vit-D Deficiency, Fractures)  Low Vitamin D level contributes to fatigue and are associated with obesity, breast, and colon cancer. She agrees to continue to take prescription Vitamin D @50 ,000 IU every week and will follow-up for routine testing of Vitamin D, at least 2-3 times per year to avoid over-replacement.   Nausea and vomiting, unspecified vomiting type -     Ondansetron HCl; Take 1 tablet (4 mg total) by mouth every 8 (eight) hours as needed for nausea or vomiting.  Dispense: 20 tablet; Refill: 0 -     CBC with Differential/Platelet  Abdominal pain, unspecified abdominal location -     CBC with Differential/Platelet -     Amylase -  Lipase  Medication management -     Comprehensive metabolic panel -     CBC with Differential/Platelet  Generalized obesity -     CBC with Differential/Platelet  BMI 27.0-27.9,adult -     CBC with Differential/Platelet     Options discussed today: 1.  Stop Wegovy 2   Start Zepbound and stop Wegovy 3.  Start Bernie Covey and stop [name]  I've asked the patient to contact me by Thursday to let me know how she is doing or sooner if she worsens or symptoms persist.    Return in about 4 weeks (around 11/01/2022).Marland Kitchen She was informed of the importance of frequent follow up visits to maximize her success with intensive lifestyle modifications for her multiple health conditions.   ATTESTASTION STATEMENTS:  Reviewed by clinician on day of visit: allergies, medications, problem list, medical history, surgical history, family history, social history, and previous encounter notes.  Judeth Cornfield R. Kedrick Mcnamee FNP-C

## 2022-10-04 NOTE — Telephone Encounter (Signed)
Received refill request from [facility] today for vitamin B12 sig t2 po daily #180 RF0  PCM ordered recheck serum level today.  Last level 205 07/07/22

## 2022-10-05 ENCOUNTER — Encounter: Payer: Self-pay | Admitting: Nurse Practitioner

## 2022-10-05 NOTE — Progress Notes (Signed)
My chart message sent to patient [name], Your B12 level is now low normal.  Continue supplement B12 by mouth daily I sent refill to [facility] yesterday. Sincerely, Albina Billet NP-C

## 2022-10-06 ENCOUNTER — Encounter: Payer: Self-pay | Admitting: Registered Nurse

## 2022-10-25 ENCOUNTER — Telehealth (INDEPENDENT_AMBULATORY_CARE_PROVIDER_SITE_OTHER): Payer: No Typology Code available for payment source | Admitting: Psychology

## 2022-10-25 DIAGNOSIS — F32A Depression, unspecified: Secondary | ICD-10-CM

## 2022-10-25 DIAGNOSIS — F909 Attention-deficit hyperactivity disorder, unspecified type: Secondary | ICD-10-CM | POA: Diagnosis not present

## 2022-10-25 DIAGNOSIS — F5089 Other specified eating disorder: Secondary | ICD-10-CM

## 2022-10-25 DIAGNOSIS — F419 Anxiety disorder, unspecified: Secondary | ICD-10-CM | POA: Diagnosis not present

## 2022-10-25 NOTE — Progress Notes (Signed)
  Office: [phone]  /  Fax: [phone]    Date: October 25, 2022  Appointment Start Time: 12:02pm Duration: 22 minutes Provider: Lawerance Cruel, Psy.D. Type of Session: Individual Therapy  Location of Patient: Work (private location) Location of Provider: Provider's Home (private office) Type of Contact: Telepsychological Visit via MyChart Video Visit  Session Content: Haley Salazar is a 27 y.o. female presenting for a follow-up appointment to address the previously established treatment goal of increasing coping skills.Today's appointment was a telepsychological visit. Haley Salazar provided verbal consent for today's telepsychological appointment and she is aware she is responsible for securing confidentiality on her end of the session. Prior to proceeding with today's appointment, Destina's physical location at the time of this appointment was obtained as well a phone number she could be reached at in the event of technical difficulties. Haley Salazar and this provider participated in today's telepsychological service.   This provider conducted a brief check-in. Haley Salazar shared she got married on September 3rd. She shared about her appointment with Irene Limbo, FNP. Haley Salazar further shared she is doing "pretty good" with her eating habits and running in the morning "a couple days a week." She acknowledged she is consuming high calorie coffee drinks recently due to increased work hours. She was encouraged to review caloric intake on [employer] app and discuss further with FNP T[place]. Reflected on progress to date. Furthermore, termination planning was discussed. Haley Salazar was receptive to a follow-up appointment in 3-4 weeks and an additional follow-up/termination appointment in 4-5 weeks after that. Overall, Haley Salazar was receptive to today's appointment as evidenced by openness to sharing, responsiveness to feedback, and willingness to continue engaging in learned skills.  Mental Status  Examination:  Appearance: neat Behavior: appropriate to circumstances Mood: neutral Affect: mood congruent Speech: WNL Eye Contact: appropriate Psychomotor Activity: WNL Gait: unable to assess Thought Process: linear, logical, and goal directed and denies suicidal, homicidal, and self-harm ideation, plan and intent  Thought Content/Perception: no hallucinations, delusions, bizarre thinking or behavior endorsed or observed Orientation: AAOx4 Memory/Concentration: intact Insight: good Judgment: good  Interventions:  Conducted a brief chart review Conducted a risk assessment Provided empathic reflections and validation Provided positive reinforcement Employed supportive psychotherapy interventions to facilitate reduced distress and to improve coping skills with identified stressors Discussed termination planning  DSM-5 Diagnosis(es):  F50.89 Other Specified Feeding or Eating Disorder, Emotional Eating Behaviors, F90.9 Unspecified Attention-Deficit/Hyperactivity Disorder , F41.9 Unspecified Anxiety Disorder, and  F32.A Unspecified Depressive Disorder  Treatment Goal & Progress: During the initial appointment with this provider, the following treatment goal was established: increase coping skills. Makiyla has demonstrated progress in her goal as evidenced by increased awareness of hunger patterns, increased awareness of triggers for emotional eating behaviors, and reduction in emotional eating behaviors . Haley Salazar also continues to demonstrate willingness to engage in learned skill(s).  Plan: The next appointment is scheduled for 11/15/2022 at 11:30am, which will be via MyChart Video Visit. The next session will focus on working towards the established treatment goal. Haley Salazar will continue with her primary therapist. Due to an issue with rescheduling an ADHD evaluation, this provider placed another referral with [facility].

## 2022-11-02 ENCOUNTER — Ambulatory Visit: Payer: No Typology Code available for payment source | Admitting: Nurse Practitioner

## 2022-11-02 ENCOUNTER — Telehealth: Payer: Self-pay

## 2022-11-02 ENCOUNTER — Encounter: Payer: Self-pay | Admitting: Nurse Practitioner

## 2022-11-02 VITALS — BP 115/79 | HR 95 | Temp 98.4°F | Ht 64.0 in | Wt 164.0 lb

## 2022-11-02 DIAGNOSIS — Z6828 Body mass index (BMI) 28.0-28.9, adult: Secondary | ICD-10-CM

## 2022-11-02 DIAGNOSIS — E669 Obesity, unspecified: Secondary | ICD-10-CM | POA: Diagnosis not present

## 2022-11-02 DIAGNOSIS — E559 Vitamin D deficiency, unspecified: Secondary | ICD-10-CM

## 2022-11-02 DIAGNOSIS — E538 Deficiency of other specified B group vitamins: Secondary | ICD-10-CM | POA: Diagnosis not present

## 2022-11-02 MED ORDER — WEGOVY 0.25 MG/0.5ML ~~LOC~~ SOAJ
0.2500 mg | SUBCUTANEOUS | 0 refills | Status: DC
Start: 2022-11-02 — End: 2022-12-26

## 2022-11-02 MED ORDER — CYANOCOBALAMIN 1000 MCG PO TABS
1000.0000 ug | ORAL_TABLET | Freq: Every day | ORAL | 0 refills | Status: DC
Start: 2022-11-02 — End: 2023-05-08

## 2022-11-02 NOTE — Telephone Encounter (Signed)
PA submitted through Cover My Meds for [facility]. Awaiting insurance determination. Key: Ronn Melena

## 2022-11-02 NOTE — Telephone Encounter (Signed)
[facility] does not manage PA for this patient. Please contact the number on the back of the members card for further assistance

## 2022-11-02 NOTE — Patient Instructions (Signed)
Steps to starting your [facility]  The office staff will send a prior authorization request to your insurance company for approval. We will send you a mychart message once we hear back from your insurance with a decision.  This can take up to 7-10 business days.   Once your WegovyTis approved, you may then pick up [facility] pen from your pharmacy.    Learn how to do Wegovy injections on the [email or web address] website. There is a training video that will walk you through how to safely perform the injection. If you have questions for our clinical staff, please contact our  clinical staff. If you have any symptoms of allergic reaction to [facility] discontinue immediately and call 911.  1. What should I tell my provider before using WegovyT ? have or have had problems with your pancreas or kidneys. have type 2 diabetes and a history of diabetic retinopathy. have or have had depression, suicidal thoughts, or mental health issues. are pregnant or plan to become pregnant. Joesphine Bare may harm your unborn baby. You should stop using WegovyT 3 months before you plan to become pregnant or if you are breastfeeding or plan to breastfeed. It is not known if WegovyT passes into your breast milk.  2. What is Joesphine Bare and how does it work?  Joesphine Bare is an injectable prescription medication prescribed by your provider to help with your weight loss.  This medicine will be most effective when combined with a reduced calorie diet and physical activity.  Joesphine Bare is not for the treatment of type 2 diabetes mellitus. Joesphine Bare should not be used with other GLP-1 receptor agonist medicines. The addition of WegovyT in  patients treated with insulin has not been evaluated. When initiating WegovyT, consider reducing the dose of concomitantly administered insulin secretagogues (such as sulfonylureas) or insulin to reduce the risk of  hypoglycemia.  One role of GLP-1 is to send a signal to your brain to tell it you are full. It also slows down  stomach emptying which will make you feel full longer and may help with reducing cravings.   3.  How should I take WegovyT?  Administer WegovyT once weekly, on the same day each week, at any time of day, with or without meals Inject subcutaneously in the abdomen, thigh or upper arm Initiate at 0.25 mg once weekly for 4 weeks. In 4 week intervals, increase the dose until a dose of 2.4 mg is reached (we will discuss with you the dosage at each visit). The maintenance dose of WegovyT is 2.4 mg once weekly.  The dosing schedule of Wegovy is:  0.25 mg per week X 4 weeks 0.5 mg per week X 4 weeks 1.0 mg per week X 4 weeks 1.7 mg per week X 4 weeks 2.4 mg per week   Missed dose   If you miss your injection day, go ahead inject your current dose. You can go >7 days, but not <7 days between injections. You may change your injection day (It must be >7 days). If you miss >2 doses, you can still keep next injection dose the same or follow de-escalation schedule which may minimize GI symptoms.   In patients with type 2 diabetes, monitor blood glucose prior to starting and during WEGOVYT treatment.   Inject your dose of Wegovy under the skin (subcutaneous injection) in your stomach area (abdomen), upper leg (thigh) or upper arm. Do not inject into a vein or a muscle. The injection site should be rotated and not given in the  same spot each day. Hold the needle under the skin and count to "10". This will allow all of the medicine to be dispensed under the skin. Always wipe your skin with an alcohol prep pad before injection  Dispose of used pen in an approved sharps container. More practical options that can be put in the trash  to go to the landfill are milk jugs or plastic laundry detergent containers with a screw on lid.  What side effects may I notice from taking WegovyT?  Side effects that usually do not require medical attention (report to our office if they continue or are bothersome): Nausea  (most common but decreases over time in most people as their body gets used to the medicine) Diarrhea Constipation (you may take an over the counter laxative if needed) Headache Decreased appetite Upset stomach Tiredness Dizziness Feeling bloated Hair loss Belching Gas Heartburn  Side effects that you should call 911 as soon as possible Vomiting Stomach pain Fever Yellowing of your skin or eyes  Clay-colored stools Increased heart rate while at rest Low blood sugar  Sudden changes in mood, behaviors, thoughts, feelings, or thoughts of suicide If you get a lump or swelling in your neck, hoarseness, trouble swallowing, or shortness of breath. Allergic reaction such as skin rash, itching, hives, swelling of the face, tongue, or lips  Helpful tips for managing nausea Nausea is a common side effect when first starting WegovyT. If you experience nausea, be sure to connect with your health care provider. He or she will offer guidance on ways to manage it, which may include: Eat bland, low-fat foods, like crackers, toast and rice  Eat foods that contain water, like soups and gelatin  Avoid lying down after you eat  Go outdoors for fresh air  Eat more slowly    Other important information Do not drop your pen or knock it against hard surfaces  Do not expose your pen to any liquids  If you think that your pen may be damaged, do not try to fix it. Use a new one Keep the pen cap on until you are ready to inject. Your pen will no longer be sterile if you store an unused pen without the cap, if you pull the pen cap off and put it on again, or if the pen cap is missing. This could lead to an infection  Store the [place] pen in the refrigerator from 63F to 82F (2C to 8C) If needed, before removing the pen cap, WegovyT can be stored from 8C to 30C (82F to 33F) in the original carton for up to 28 days.  Keep WegovyT in the original carton to protect it from light  Do not freeze   Throw away pen if WegovyT has been frozen, has been exposed to light or temperatures above 33F (30C), or has been out of the refrigerator for 28 days or longer It's important to properly dispose of your used WegovyT pens. Do not throw the pen away in your household trash. Instead, use an FDA-cleared sharps disposable container or a sturdy household container with a tight-fitting lid, like a heavy duty plastic container.   ZOXWRU pen training website: NastyThought.uy  Wegovy savings and support link: achegone.com

## 2022-11-02 NOTE — Progress Notes (Signed)
Office: [phone]  /  Fax: [phone]  WEIGHT SUMMARY AND BIOMETRICS  Weight Lost Since Last Visit: 0lb  Weight Gained Since Last Visit: 5lb   Vitals Temp: 98.4 F (36.9 C) BP: 115/79 Pulse Rate: 95 SpO2: 100 %   Anthropometric Measurements Height: 5\' 4"  (1.626 m) Weight: 164 lb (74.4 kg) BMI (Calculated): 28.14 Weight at Last Visit: 159lb Weight Lost Since Last Visit: 0lb Weight Gained Since Last Visit: 5lb Starting Weight: 179lb Total Weight Loss (lbs): 15 lb (6.804 kg)   Body Composition  Body Fat %: 33.7 % Fat Mass (lbs): 55.4 lbs Muscle Mass (lbs): 103.6 lbs Total Body Water (lbs): 73.8 lbs Visceral Fat Rating : 4   Other Clinical Data Fasting: Yes Labs: No Today's Visit #: 12 Starting Date: 02/23/22     HPI  Chief Complaint: OBESITY  Haley Salazar is here to discuss her progress with her obesity treatment plan. She is on the keeping a food journal and adhering to recommended goals of 1700 calories and 80 protein and states she is following her eating plan approximately 50 % of the time. She states she is exercising 60 minutes 3 days per week.   Interval History:  Since last office visit she has gained 5 pounds.  Since stopping Wegovy she is struggling with polyphagia and cravings.  States it's the worse cravings she has "had ever in my life".  Side effects subsided after stopping Wegovy. She is drinking water and has been drinking starbucks more.  She is running several days per week.    She did well on Wegovy on 0.25mg  and 0.5mg .   Pharmacotherapy for weight loss: She is not currently taking medications  for medical weight loss.  Stopped  [employer] after her last visit.    Previous pharmacotherapy for medical weight loss:  GUYQIH  Bariatric surgery:  Patient has not had bariatric surgery   Vit B12 def Last Vit B12 looked better.  She is taking Vit B12 1,000 OTC.    Vit D deficiency  She is not currently taking Vit D 50,000 IU weekly.  She  reports side effects of dizziness with the higher doses.      Lab Results  Component Value Date   VD25OH 27.9 (L) 10/04/2022   VD25OH 38.9 07/07/2022   VD25OH 29.0 (L) 02/23/2022     PHYSICAL EXAM:  Blood pressure 115/79, pulse 95, temperature 98.4 F (36.9 C), height 5\' 4"  (1.626 m), weight 164 lb (74.4 kg), SpO2 100%. Body mass index is 28.15 kg/m.  General: She is overweight, cooperative, alert, well developed, and in no acute distress. PSYCH: Has normal mood, affect and thought process.   Extremities: No edema.  Neurologic: No gross sensory or motor deficits. No tremors or fasciculations noted.    DIAGNOSTIC DATA REVIEWED:  BMET    Component Value Date/Time   NA 142 10/04/2022 0833   K 4.6 10/04/2022 0833   CL 106 10/04/2022 0833   CO2 19 (L) 10/04/2022 0833   GLUCOSE 84 10/04/2022 0833   GLUCOSE 96 06/22/2020 1357   BUN 10 10/04/2022 0833   CREATININE 0.78 10/04/2022 0833   CREATININE 0.67 10/29/2019 0910   CALCIUM 9.9 10/04/2022 0833   GFRNONAA 124 10/29/2019 0910   GFRAA 144 10/29/2019 0910   Lab Results  Component Value Date   HGBA1C 5.2 07/07/2022   HGBA1C 5.7 06/22/2020   Lab Results  Component Value Date   INSULIN 16.3 07/07/2022   INSULIN 13.7 02/23/2022   Lab Results  Component Value Date   TSH 1.210 02/23/2022   CBC    Component Value Date/Time   WBC 7.2 10/04/2022 0833   WBC 9.4 06/22/2020 1357   RBC 4.97 10/04/2022 0833   RBC 4.41 06/22/2020 1357   HGB 14.9 10/04/2022 0833   HCT 44.9 10/04/2022 0833   PLT 382 10/04/2022 0833   MCV 90 10/04/2022 0833   MCH 30.0 10/04/2022 0833   MCH 29.2 10/29/2019 0910   MCHC 33.2 10/04/2022 0833   MCHC 33.7 06/22/2020 1357   RDW 11.8 10/04/2022 0833   Iron Studies    Component Value Date/Time   IRON 105 02/23/2022 0952   TIBC 422 02/23/2022 0952   FERRITIN 87 02/23/2022 0952   IRONPCTSAT 25 02/23/2022 0952   Lipid Panel     Component Value Date/Time   CHOL 193 07/07/2022 0750   TRIG  173 (H) 07/07/2022 0750   HDL 46 07/07/2022 0750   CHOLHDL 4.4 03/22/2021 0930   CHOLHDL 4 06/22/2020 1357   VLDL 40.2 (H) 06/22/2020 1357   LDLCALC 117 (H) 07/07/2022 0750   LDLCALC 141 (H) 10/29/2019 0910   LDLDIRECT 122.0 06/22/2020 1357   Hepatic Function Panel     Component Value Date/Time   PROT 7.8 10/04/2022 0833   ALBUMIN 4.8 10/04/2022 0833   AST 10 10/04/2022 0833   ALT 8 10/04/2022 0833   ALKPHOS 57 10/04/2022 0833   BILITOT 0.3 10/04/2022 0833      Component Value Date/Time   TSH 1.210 02/23/2022 0952   Nutritional Lab Results  Component Value Date   VD25OH 27.9 (L) 10/04/2022   VD25OH 38.9 07/07/2022   VD25OH 29.0 (L) 02/23/2022     ASSESSMENT AND PLAN  TREATMENT PLAN FOR OBESITY:  Recommended Dietary Goals  Makira is currently in the action stage of change. As such, her goal is to continue weight management plan. She has agreed to keeping a food journal and adhering to recommended goals of 1600 calories and 80+ protein.  Behavioral Intervention  We discussed the following Behavioral Modification Strategies today: continue to work on maintaining a reduced calorie state, getting the recommended amount of protein, incorporating whole foods, making healthy choices, staying well hydrated and practicing mindfulness when eating..  Additional resources provided today: NA  Recommended Physical Activity Goals  Taffie has been advised to work up to 150 minutes of moderate intensity aerobic activity a week and strengthening exercises 2-3 times per week for cardiovascular health, weight loss maintenance and preservation of muscle mass.   She has agreed to Continue current level of physical activity    Pharmacotherapy We discussed various medication options to help [name] with her weight loss efforts and we both agreed to start [facility] 0.25mg .  Side effects discussed.   ASSOCIATED CONDITIONS ADDRESSED TODAY  Action/Plan  B12 deficiency -      Cyanocobalamin; Take 1 tablet (1,000 mcg total) by mouth daily.  Dispense: 180 tablet; Refill: 0  Vitamin D deficiency Start Vit D OTC.  Will continue to monitor.   Generalized obesity -     Restart [id number]; Inject 0.25 mg into the skin once a week.  Dispense: 2 mL; Refill: 0  BMI 28.0-28.9,adult     Labs reviewed in chart with patient from 10/04/22    Return in about 4 weeks (around 11/30/2022).Marland Kitchen She was informed of the importance of frequent follow up visits to maximize her success with intensive lifestyle modifications for her multiple health conditions.   ATTESTASTION STATEMENTS:  Reviewed by clinician  on day of visit: allergies, medications, problem list, medical history, surgical history, family history, social history, and previous encounter notes.     Theodis Sato. Jaelin Fackler FNP-C

## 2022-11-03 NOTE — Telephone Encounter (Signed)
Form for [facility] filled out and faxed along with notes to [phone].

## 2022-11-03 NOTE — Telephone Encounter (Signed)
Received fax from [facility] that coverage review is done by [employer]. Form printed and filled out for them. Faxed along with notes to [phone].

## 2022-11-07 ENCOUNTER — Ambulatory Visit: Payer: No Typology Code available for payment source | Admitting: Psychology

## 2022-11-07 NOTE — Telephone Encounter (Signed)
Received fax from [employer]. Wegovy 0.25mg  pen has been approved 11/07/22-01/06/23.

## 2022-11-15 ENCOUNTER — Telehealth (INDEPENDENT_AMBULATORY_CARE_PROVIDER_SITE_OTHER): Payer: No Typology Code available for payment source | Admitting: Psychology

## 2022-11-23 ENCOUNTER — Ambulatory Visit: Payer: No Typology Code available for payment source | Admitting: Psychology

## 2022-11-24 ENCOUNTER — Ambulatory Visit: Payer: No Typology Code available for payment source | Admitting: Psychology

## 2022-11-28 ENCOUNTER — Ambulatory Visit: Payer: No Typology Code available for payment source | Admitting: Nurse Practitioner

## 2022-11-28 ENCOUNTER — Encounter: Payer: Self-pay | Admitting: Nurse Practitioner

## 2022-11-28 VITALS — BP 136/80 | HR 81 | Temp 98.4°F | Ht 64.0 in | Wt 165.0 lb

## 2022-11-28 DIAGNOSIS — R632 Polyphagia: Secondary | ICD-10-CM | POA: Diagnosis not present

## 2022-11-28 DIAGNOSIS — Z6828 Body mass index (BMI) 28.0-28.9, adult: Secondary | ICD-10-CM | POA: Diagnosis not present

## 2022-11-28 DIAGNOSIS — E669 Obesity, unspecified: Secondary | ICD-10-CM | POA: Diagnosis not present

## 2022-11-28 NOTE — Progress Notes (Signed)
Office: [phone]  /  Fax: [phone]  WEIGHT SUMMARY AND BIOMETRICS  Weight Lost Since Last Visit: 0lb  Weight Gained Since Last Visit: 1lb   Vitals Temp: 98.4 F (36.9 C) BP: 136/80 Pulse Rate: 81 SpO2: 98 %   Anthropometric Measurements Height: 5\' 4"  (1.626 m) Weight: 165 lb (74.8 kg) BMI (Calculated): 28.31 Weight at Last Visit: 164lb Weight Lost Since Last Visit: 0lb Weight Gained Since Last Visit: 1lb Starting Weight: 179lb Total Weight Loss (lbs): 14 lb (6.35 kg)   Body Composition  Body Fat %: 34.4 % Fat Mass (lbs): 57 lbs Muscle Mass (lbs): 103 lbs Total Body Water (lbs): 74.8 lbs Visceral Fat Rating : 5   Other Clinical Data Fasting: Yes Labs: No Today's Visit #: 13 Starting Date: 02/23/22     HPI  Chief Complaint: OBESITY  Haley Salazar is here to discuss her progress with her obesity treatment plan. She is on the keeping a food journal and adhering to recommended goals of 1700 calories and 80 protein and states she is following her eating plan approximately 75 % of the time. She states she is exercising 45 minutes 3 days per week.   Interval History:  Since last office visit she has gained 1 pound.  She was out of town last week and will be on vacation next week.  She signed up for a 5k in December.     Pharmacotherapy for weight loss: She is not currently taking medications  for medical weight loss.  She plans to start [facility] 0.25mg  but hasn't picked up yet due to shortage.   Has been approved until 01/06/23.  Notes some polyphagia and cravings.    Previous pharmacotherapy for medical weight loss:  JJOACZ  Bariatric surgery:  Patient has not had bariatric surgery,     PHYSICAL EXAM:  Blood pressure 136/80, pulse 81, temperature 98.4 F (36.9 C), height 5\' 4"  (1.626 m), weight 165 lb (74.8 kg), SpO2 98%. Body mass index is 28.32 kg/m.  General: She is overweight, cooperative, alert, well developed, and in no acute distress. PSYCH:  Has normal mood, affect and thought process.   Extremities: No edema.  Neurologic: No gross sensory or motor deficits. No tremors or fasciculations noted.    DIAGNOSTIC DATA REVIEWED:  BMET    Component Value Date/Time   NA 142 10/04/2022 0833   K 4.6 10/04/2022 0833   CL 106 10/04/2022 0833   CO2 19 (L) 10/04/2022 0833   GLUCOSE 84 10/04/2022 0833   GLUCOSE 96 06/22/2020 1357   BUN 10 10/04/2022 0833   CREATININE 0.78 10/04/2022 0833   CREATININE 0.67 10/29/2019 0910   CALCIUM 9.9 10/04/2022 0833   GFRNONAA 124 10/29/2019 0910   GFRAA 144 10/29/2019 0910   Lab Results  Component Value Date   HGBA1C 5.2 07/07/2022   HGBA1C 5.7 06/22/2020   Lab Results  Component Value Date   INSULIN 16.3 07/07/2022   INSULIN 13.7 02/23/2022   Lab Results  Component Value Date   TSH 1.210 02/23/2022   CBC    Component Value Date/Time   WBC 7.2 10/04/2022 0833   WBC 9.4 06/22/2020 1357   RBC 4.97 10/04/2022 0833   RBC 4.41 06/22/2020 1357   HGB 14.9 10/04/2022 0833   HCT 44.9 10/04/2022 0833   PLT 382 10/04/2022 0833   MCV 90 10/04/2022 0833   MCH 30.0 10/04/2022 0833   MCH 29.2 10/29/2019 0910   MCHC 33.2 10/04/2022 0833   MCHC 33.7 06/22/2020 1357  RDW 11.8 10/04/2022 0833   Iron Studies    Component Value Date/Time   IRON 105 02/23/2022 0952   TIBC 422 02/23/2022 0952   FERRITIN 87 02/23/2022 0952   IRONPCTSAT 25 02/23/2022 0952   Lipid Panel     Component Value Date/Time   CHOL 193 07/07/2022 0750   TRIG 173 (H) 07/07/2022 0750   HDL 46 07/07/2022 0750   CHOLHDL 4.4 03/22/2021 0930   CHOLHDL 4 06/22/2020 1357   VLDL 40.2 (H) 06/22/2020 1357   LDLCALC 117 (H) 07/07/2022 0750   LDLCALC 141 (H) 10/29/2019 0910   LDLDIRECT 122.0 06/22/2020 1357   Hepatic Function Panel     Component Value Date/Time   PROT 7.8 10/04/2022 0833   ALBUMIN 4.8 10/04/2022 0833   AST 10 10/04/2022 0833   ALT 8 10/04/2022 0833   ALKPHOS 57 10/04/2022 0833   BILITOT 0.3  10/04/2022 0833      Component Value Date/Time   TSH 1.210 02/23/2022 0952   Nutritional Lab Results  Component Value Date   VD25OH 27.9 (L) 10/04/2022   VD25OH 38.9 07/07/2022   VD25OH 29.0 (L) 02/23/2022     ASSESSMENT AND PLAN  TREATMENT PLAN FOR OBESITY:  Recommended Dietary Goals  Haley Salazar is currently in the action stage of change. As such, her goal is to continue weight management plan. She has agreed to keeping a food journal and adhering to recommended goals of 1700 calories and 80 protein.  Behavioral Intervention  We discussed the following Behavioral Modification Strategies today: increasing lean protein intake to established goals, increasing water intake , work on meal planning and preparation, work on tracking and journaling calories using tracking application, reading food labels , keeping healthy foods at home, work on managing stress, creating time for self-care and relaxation, and continue to work on maintaining a reduced calorie state, getting the recommended amount of protein, incorporating whole foods, making healthy choices, staying well hydrated and practicing mindfulness when eating..  Additional resources provided today: NA  Recommended Physical Activity Goals  Haley Salazar has been advised to work up to 150 minutes of moderate intensity aerobic activity a week and strengthening exercises 2-3 times per week for cardiovascular health, weight loss maintenance and preservation of muscle mass.   She has agreed to Continue current level of physical activity    Pharmacotherapy We discussed various medication options to help Haley Salazar with her weight loss efforts and we both agreed to start [facility] 0.25mg .  Side effects discussed.    ASSOCIATED CONDITIONS ADDRESSED TODAY  Action/Plan  Polyphagia Start [facility] 0.25mg .  Side effects discussed  Generalized obesity  BMI 28.0-28.9,adult         Return in about 4 weeks (around 12/26/2022).Marland Kitchen She was  informed of the importance of frequent follow up visits to maximize her success with intensive lifestyle modifications for her multiple health conditions.   ATTESTASTION STATEMENTS:  Reviewed by clinician on day of visit: allergies, medications, problem list, medical history, surgical history, family history, social history, and previous encounter notes.   Time spent on visit including pre-visit chart review and post-visit care and charting was 30 minutes.    Theodis Sato. Bari Leib FNP-C

## 2022-12-05 ENCOUNTER — Ambulatory Visit: Payer: No Typology Code available for payment source | Admitting: Psychology

## 2022-12-20 ENCOUNTER — Telehealth (INDEPENDENT_AMBULATORY_CARE_PROVIDER_SITE_OTHER): Payer: No Typology Code available for payment source | Admitting: Psychology

## 2022-12-20 DIAGNOSIS — F909 Attention-deficit hyperactivity disorder, unspecified type: Secondary | ICD-10-CM | POA: Diagnosis not present

## 2022-12-20 DIAGNOSIS — F419 Anxiety disorder, unspecified: Secondary | ICD-10-CM

## 2022-12-20 DIAGNOSIS — F5089 Other specified eating disorder: Secondary | ICD-10-CM | POA: Diagnosis not present

## 2022-12-20 DIAGNOSIS — F32A Depression, unspecified: Secondary | ICD-10-CM | POA: Diagnosis not present

## 2022-12-20 NOTE — Progress Notes (Signed)
  Office: [phone]  /  Fax: [phone]    Date: December 20, 2022  Appointment Start Time: 11:32am Duration: 29 minutes Provider: Lawerance Cruel, Psy.D. Type of Session: Individual Therapy  Location of Patient: Work (private location) Location of Provider: Provider's Home (private office) Type of Contact: Telepsychological Visit via MyChart Video Visit  Session Content: Haley Salazar is a 27 y.o. female presenting for a follow-up appointment to address the previously established treatment goal of increasing coping skills.Today's appointment was a telepsychological visit. Haley Salazar provided verbal consent for today's telepsychological appointment and she is aware she is responsible for securing confidentiality on her end of the session. Prior to proceeding with today's appointment, Haley Salazar's physical location at the time of this appointment was obtained as well a phone number she could be reached at in the event of technical difficulties. Haley Salazar and this provider participated in today's telepsychological service.   This provider conducted a brief check-in. Haley Salazar shared, "Things have been going okay." She shared about recent events. While she discussed some challenges with her eating habits, she described continuing to make better choices and engage in portion control. She also continues to report a reduction in emotional eating behaviors. Due to holidays, psychoeducation regarding making better choices and engaging in portion control during the holidays/celebrations/vacations was provided. More specifically, this provider discussed the following strategies: coming to meals hungry, but not starving; avoid filling up on appetizers; managing portion sizes; not completely depriving yourself; making the plate colorful (e.g., vegetables); pacing yourself (e.g., waiting 10 minutes before going back for seconds); taking advantage of the nutritious foods; practicing mindfulness; staying hydrated; and avoid  bringing home leftovers. Overall, Haley Salazar was receptive to today's appointment as evidenced by openness to sharing, responsiveness to feedback, and willingness to implement discussed strategies .  Mental Status Examination:  Appearance: neat Behavior: appropriate to circumstances Mood: neutral Affect: mood congruent Speech: WNL Eye Contact: appropriate Psychomotor Activity: WNL Gait: unable to assess Thought Process: linear, logical, and goal directed and denies suicidal, homicidal, and self-harm ideation, plan and intent  Thought Content/Perception: no hallucinations, delusions, bizarre thinking or behavior endorsed or observed Orientation: AAOx4 Memory/Concentration: intact Insight: good Judgment: good  Interventions:  Conducted a brief chart review Conducted a risk assessment Provided empathic reflections and validation Provided positive reinforcement Employed supportive psychotherapy interventions to facilitate reduced distress and to improve coping skills with identified stressors Psychoeducation provided regarding strategies for celebrations/holidays/vacations  DSM-5 Diagnosis(es):  F50.89 Other Specified Feeding or Eating Disorder, Emotional Eating Behaviors, F90.9 Unspecified Attention-Deficit/Hyperactivity Disorder , F41.9 Unspecified Anxiety Disorder, and  F32.A Unspecified Depressive Disorder  Treatment Goal & Progress: During the initial appointment with this provider, the following treatment goal was established: increase coping skills. Haley Salazar has demonstrated progress in her goal as evidenced by increased awareness of hunger patterns, increased awareness of triggers for emotional eating behaviors, and reduction in emotional eating behaviors . Haley Salazar also continues to demonstrate willingness to engage in learned skill(s).  Plan: The next appointment is scheduled for 01/17/2023 at 11:30am, which will be via MyChart Video Visit. The next session will focus on working  towards the established treatment goal and termination. Haley Salazar will continue with her primary therapist and psychiatric provider.

## 2022-12-26 ENCOUNTER — Encounter: Payer: Self-pay | Admitting: Nurse Practitioner

## 2022-12-26 ENCOUNTER — Ambulatory Visit: Payer: No Typology Code available for payment source | Admitting: Nurse Practitioner

## 2022-12-26 VITALS — BP 128/77 | HR 62 | Temp 97.9°F | Ht 64.0 in | Wt 166.0 lb

## 2022-12-26 DIAGNOSIS — E559 Vitamin D deficiency, unspecified: Secondary | ICD-10-CM

## 2022-12-26 DIAGNOSIS — Z6828 Body mass index (BMI) 28.0-28.9, adult: Secondary | ICD-10-CM

## 2022-12-26 DIAGNOSIS — R112 Nausea with vomiting, unspecified: Secondary | ICD-10-CM

## 2022-12-26 DIAGNOSIS — R632 Polyphagia: Secondary | ICD-10-CM

## 2022-12-26 DIAGNOSIS — E669 Obesity, unspecified: Secondary | ICD-10-CM | POA: Diagnosis not present

## 2022-12-26 MED ORDER — ONDANSETRON HCL 4 MG PO TABS: 4.0000 mg | ORAL_TABLET | Freq: Three times a day (TID) | ORAL | 0 refills | Status: DC | PRN

## 2022-12-26 MED ORDER — WEGOVY 0.25 MG/0.5ML ~~LOC~~ SOAJ: 0.2500 mg | SUBCUTANEOUS | 0 refills | Status: DC

## 2022-12-26 NOTE — Progress Notes (Signed)
Office: [phone]  /  Fax: [phone]  WEIGHT SUMMARY AND BIOMETRICS  Weight Lost Since Last Visit: 0lb  Weight Gained Since Last Visit: 1lb   Vitals Temp: 97.9 F (36.6 C) BP: 128/77 Pulse Rate: 62 SpO2: 98 %   Anthropometric Measurements Height: 5\' 4"  (1.626 m) Weight: 166 lb (75.3 kg) BMI (Calculated): 28.48 Weight at Last Visit: 165lb Weight Lost Since Last Visit: 0lb Weight Gained Since Last Visit: 1lb Starting Weight: 179lb Total Weight Loss (lbs): 13 lb (5.897 kg)   Body Composition  Body Fat %: 34.2 % Fat Mass (lbs): 57 lbs Muscle Mass (lbs): 104.2 lbs Total Body Water (lbs): 75.2 lbs Visceral Fat Rating : 5   Other Clinical Data Fasting: Yes Labs: No Today's Visit #: 14 Starting Date: 02/23/22     HPI  Chief Complaint: OBESITY  Haley Salazar is here to discuss her progress with her obesity treatment plan. She is on the keeping a food journal and adhering to recommended goals of 1700 calories and 80 protein and states she is following her eating plan approximately 75 % of the time. She states she is exercising 45 minutes 4 days per week.   Interval History:  Since last office visit she gained 1 pound.  She went to [facility] since her last visit.  She is doing a 5k this weekend.  She is running 4 days per week.    Saw Dr. Dewaine Salazar last on 12/20/22 and has a follow up visit scheduled on 01/17/23  Pharmacotherapy for weight loss:  She is taking Wegovy 0.25mg  (x 3 doses).  Had side effects of vomiting the first week and since then that has resolved.  Notes nausea occasionally and takes zofran PRN.     Previous pharmacotherapy for medical weight loss:  [id number]   Bariatric surgery:  Patient has not had bariatric surgery   Vit D deficiency  She is taking Vit D 1,000 international units daily OTC.  Denies side effects.     Lab Results  Component Value Date   VD25OH 27.9 (L) 10/04/2022   VD25OH 38.9 07/07/2022   VD25OH 29.0 (L) 02/23/2022      PHYSICAL EXAM:  Blood pressure 128/77, pulse 62, temperature 97.9 F (36.6 C), height 5\' 4"  (1.626 m), weight 166 lb (75.3 kg), SpO2 98%. Body mass index is 28.49 kg/m.  General: She is overweight, cooperative, alert, well developed, and in no acute distress. PSYCH: Has normal mood, affect and thought process.   Extremities: No edema.  Neurologic: No gross sensory or motor deficits. No tremors or fasciculations noted.    DIAGNOSTIC DATA REVIEWED:  BMET    Component Value Date/Time   NA 142 10/04/2022 0833   K 4.6 10/04/2022 0833   CL 106 10/04/2022 0833   CO2 19 (L) 10/04/2022 0833   GLUCOSE 84 10/04/2022 0833   GLUCOSE 96 06/22/2020 1357   BUN 10 10/04/2022 0833   CREATININE 0.78 10/04/2022 0833   CREATININE 0.67 10/29/2019 0910   CALCIUM 9.9 10/04/2022 0833   GFRNONAA 124 10/29/2019 0910   GFRAA 144 10/29/2019 0910   Lab Results  Component Value Date   HGBA1C 5.2 07/07/2022   HGBA1C 5.7 06/22/2020   Lab Results  Component Value Date   INSULIN 16.3 07/07/2022   INSULIN 13.7 02/23/2022   Lab Results  Component Value Date   TSH 1.210 02/23/2022   CBC    Component Value Date/Time   WBC 7.2 10/04/2022 0833   WBC 9.4 06/22/2020 1357   RBC  4.97 10/04/2022 0833   RBC 4.41 06/22/2020 1357   HGB 14.9 10/04/2022 0833   HCT 44.9 10/04/2022 0833   PLT 382 10/04/2022 0833   MCV 90 10/04/2022 0833   MCH 30.0 10/04/2022 0833   MCH 29.2 10/29/2019 0910   MCHC 33.2 10/04/2022 0833   MCHC 33.7 06/22/2020 1357   RDW 11.8 10/04/2022 0833   Iron Studies    Component Value Date/Time   IRON 105 02/23/2022 0952   TIBC 422 02/23/2022 0952   FERRITIN 87 02/23/2022 0952   IRONPCTSAT 25 02/23/2022 0952   Lipid Panel     Component Value Date/Time   CHOL 193 07/07/2022 0750   TRIG 173 (H) 07/07/2022 0750   HDL 46 07/07/2022 0750   CHOLHDL 4.4 03/22/2021 0930   CHOLHDL 4 06/22/2020 1357   VLDL 40.2 (H) 06/22/2020 1357   LDLCALC 117 (H) 07/07/2022 0750   LDLCALC  141 (H) 10/29/2019 0910   LDLDIRECT 122.0 06/22/2020 1357   Hepatic Function Panel     Component Value Date/Time   PROT 7.8 10/04/2022 0833   ALBUMIN 4.8 10/04/2022 0833   AST 10 10/04/2022 0833   ALT 8 10/04/2022 0833   ALKPHOS 57 10/04/2022 0833   BILITOT 0.3 10/04/2022 0833      Component Value Date/Time   TSH 1.210 02/23/2022 0952   Nutritional Lab Results  Component Value Date   VD25OH 27.9 (L) 10/04/2022   VD25OH 38.9 07/07/2022   VD25OH 29.0 (L) 02/23/2022     ASSESSMENT AND PLAN  TREATMENT PLAN FOR OBESITY:  Recommended Dietary Goals  Haley Salazar is currently in the action stage of change. As such, her goal is to continue weight management plan. She has agreed to keeping a food journal and adhering to recommended goals of 1700 calories and 80 grams of protein.  Behavioral Intervention  We discussed the following Behavioral Modification Strategies today: continue to work on maintaining a reduced calorie state, getting the recommended amount of protein, incorporating whole foods, making healthy choices, staying well hydrated and practicing mindfulness when eating..  Additional resources provided today: NA  Recommended Physical Activity Goals  Haley Salazar has been advised to work up to 150 minutes of moderate intensity aerobic activity a week and strengthening exercises 2-3 times per week for cardiovascular health, weight loss maintenance and preservation of muscle mass.   She has agreed to Continue current level of physical activity    Pharmacotherapy We discussed various medication options to help Haley Salazar with her weight loss efforts and we both agreed to continue [facility] 0.25mg .  Side effects discussed.  ASSOCIATED CONDITIONS ADDRESSED TODAY  Action/Plan  Polyphagia -     Continue [id number]; Inject 0.25 mg into the skin once a week.  Dispense: 2 mL; Refill: 0.  Side effects discussed.  Vitamin D deficiency Continue Vit D OTC.   Will continue to monitor.    Nausea and vomiting, unspecified vomiting type -     Ondansetron HCl; Take 1 tablet (4 mg total) by mouth every 8 (eight) hours as needed for nausea or vomiting.  Dispense: 20 tablet; Refill: 0. Side effects discussed.   Generalized obesity -     [id number]; Inject 0.25 mg into the skin once a week.  Dispense: 2 mL; Refill: 0  BMI 28.0-28.9,adult         Return in about 4 weeks (around 01/23/2023).Marland Kitchen She was informed of the importance of frequent follow up visits to maximize her success with intensive lifestyle modifications for her multiple health conditions.  ATTESTASTION STATEMENTS:  Reviewed by clinician on day of visit: allergies, medications, problem list, medical history, surgical history, family history, social history, and previous encounter notes.    Theodis Sato. Rosella Crandell FNP-C

## 2023-01-17 ENCOUNTER — Telehealth (INDEPENDENT_AMBULATORY_CARE_PROVIDER_SITE_OTHER): Payer: No Typology Code available for payment source | Admitting: Psychology

## 2023-01-17 DIAGNOSIS — F419 Anxiety disorder, unspecified: Secondary | ICD-10-CM | POA: Diagnosis not present

## 2023-01-17 DIAGNOSIS — F5089 Other specified eating disorder: Secondary | ICD-10-CM | POA: Diagnosis not present

## 2023-01-17 DIAGNOSIS — F32A Depression, unspecified: Secondary | ICD-10-CM

## 2023-01-17 DIAGNOSIS — F909 Attention-deficit hyperactivity disorder, unspecified type: Secondary | ICD-10-CM | POA: Diagnosis not present

## 2023-01-17 NOTE — Progress Notes (Signed)
  Office: [phone]  /  Fax: [phone]    Date: January 17, 2023  Appointment Start Time: 11:30am Duration: 23 minutes Provider: Wyatt Salazar, Psy.D. Type of Session: Individual Therapy  Location of Patient: Work (private location) Location of Provider: Provider's Home (private office) Type of Contact: Telepsychological Visit via MyChart Video Visit  Session Content: Haley Salazar is a 27 y.o. female presenting for a follow-up appointment to address the previously established treatment goal of increasing coping skills.Today's appointment was a telepsychological visit. Haley Salazar provided verbal consent for today's telepsychological appointment and she is aware she is responsible for securing confidentiality on her end of the session. Prior to proceeding with today's appointment, Haley Salazar's physical location at the time of this appointment was obtained as well a phone number she could be reached at in the event of technical difficulties. Haley Salazar and this provider participated in today's telepsychological service.   This provider conducted a brief check-in. Haley Salazar stated she completed a 5K, noting a plan to complete another in March. Reviewed recent eating habits. She continues to report a reduction in emotional eating behaviors. A plan was developed to help Haley Salazar cope with emotional eating in the future using learned skills. She wrote down the following plan: be mindful of all/nothing thinking; practice self-compassion; focus on hydration; be prepared with snacks congruent to calorie/protein goals; pause to ask questions when triggered to eat (e.g., Am I really hungry?, Is there something bothering me?, and Will I feel better if I eat?); and engage in discussed coping strategies after going through the aforementioned questions (e.g., journal, engage in a pleasurable activity, brush teeth). Overall, Haley Salazar was receptive to today's appointment as evidenced by openness to sharing, responsiveness  to feedback, and willingness to continue engaging in learned skills.  Mental Status Examination:  Appearance: neat Behavior: appropriate to circumstances Mood: neutral Affect: mood congruent Speech: WNL Eye Contact: appropriate Psychomotor Activity: WNL Gait: unable to assess Thought Process: linear, logical, and goal directed and denies suicidal, homicidal, and self-harm ideation, plan and intent since last appointment with this provider Thought Content/Perception: no hallucinations, delusions, bizarre thinking or behavior endorsed or observed Orientation: AAOx4 Memory/Concentration: intact Insight: good Judgment: good  Interventions:  Conducted a brief chart review Conducted a risk assessment Provided empathic reflections and validation Provided positive reinforcement Employed supportive psychotherapy interventions to facilitate reduced distress and to improve coping skills with identified stressors Reviewed learned skills  DSM-5 Diagnosis(es):  F50.89 Other Specified Feeding or Eating Disorder, Emotional Eating Behaviors, F90.9 Unspecified Attention-Deficit/Hyperactivity Disorder , F41.9 Unspecified Anxiety Disorder, and  F32.A Unspecified Depressive Disorder  Treatment Goal & Progress:  During the initial appointment with this provider, the following treatment goal was established: increase coping skills. Haley Salazar demonstrated progress in her goal as evidenced by increased awareness of hunger patterns, increased awareness of triggers for emotional eating behaviors, and reduction in emotional eating behaviors. Haley Salazar also continues to demonstrate willingness to engage in learned skill(s).  Plan: As previously planned, today was Haley Salazar's last appointment with this provider. Haley Salazar will continue with her primary therapist and psychiatric provider. She acknowledged understanding that she may request a follow-up appointment with this provider in the future as long as she is still  established with the clinic. No further follow-up planned by this provider.    Haley Fire, PsyD

## 2023-01-24 ENCOUNTER — Encounter: Payer: Self-pay | Admitting: Nurse Practitioner

## 2023-01-24 ENCOUNTER — Ambulatory Visit: Payer: No Typology Code available for payment source | Admitting: Nurse Practitioner

## 2023-01-24 VITALS — BP 110/74 | HR 77 | Temp 97.7°F | Ht 64.0 in | Wt 169.0 lb

## 2023-01-24 DIAGNOSIS — R112 Nausea with vomiting, unspecified: Secondary | ICD-10-CM

## 2023-01-24 DIAGNOSIS — R632 Polyphagia: Secondary | ICD-10-CM

## 2023-01-24 DIAGNOSIS — Z6829 Body mass index (BMI) 29.0-29.9, adult: Secondary | ICD-10-CM

## 2023-01-24 DIAGNOSIS — E538 Deficiency of other specified B group vitamins: Secondary | ICD-10-CM | POA: Diagnosis not present

## 2023-01-24 DIAGNOSIS — E559 Vitamin D deficiency, unspecified: Secondary | ICD-10-CM | POA: Diagnosis not present

## 2023-01-24 DIAGNOSIS — E669 Obesity, unspecified: Secondary | ICD-10-CM

## 2023-01-24 MED ORDER — ONDANSETRON HCL 4 MG PO TABS: 4.0000 mg | ORAL_TABLET | Freq: Three times a day (TID) | ORAL | 0 refills | Status: DC | PRN

## 2023-01-24 MED ORDER — WEGOVY 0.5 MG/0.5ML ~~LOC~~ SOAJ: 0.5000 mg | SUBCUTANEOUS | 0 refills | Status: DC

## 2023-01-24 NOTE — Progress Notes (Signed)
 Office: [phone]  /  Fax: [phone]  WEIGHT SUMMARY AND BIOMETRICS  Weight Lost Since Last Visit: 0lb  Weight Gained Since Last Visit: 3lb   Vitals Temp: 97.7 F (36.5 C) BP: 110/74 Pulse Rate: 77 SpO2: 97 %   Anthropometric Measurements Height: 5' 4 (1.626 m) Weight: 169 lb (76.7 kg) BMI (Calculated): 28.99 Weight at Last Visit: 166lb Weight Lost Since Last Visit: 0lb Weight Gained Since Last Visit: 3lb Starting Weight: 179lb Total Weight Loss (lbs): 10 lb (4.536 kg)   Body Composition  Body Fat %: 34.5 % Fat Mass (lbs): 58.6 lbs Muscle Mass (lbs): 105.4 lbs Total Body Water (lbs): 75.2 lbs Visceral Fat Rating : 5   Other Clinical Data Fasting: Yes Labs: No Today's Visit #: 15 Starting Date: 02/23/22     HPI  Chief Complaint: OBESITY  Haley Salazar is here to discuss her progress with her obesity treatment plan. She is on the keeping a food journal and adhering to recommended goals of 1700 calories and 80 protein and states she is following her eating plan approximately 60 % of the time. She states she is exercising 30 minutes 2 days per week.   Interval History:  Since last office visit she has gained 3 pounds.  She is not skipping meals.  She has gotten off track due to stress and holidays.  She is working on increasing her water intake.  Occ she will drink some ginger ale. She ran a 5k on Dec 14th.  She is signed up for a 5k in March.  She is struggling with running due to the weather.    She is under more stress at work and is stress eating.  Her stress at work should decrease after next week.   Saw Dr. Sharron last on 01/17/23 and will see her back PRN.    Pharmacotherapy for weight loss:  She is taking Wegovy  0.25mg .  Takes Zofran  PRN for nausea.  Is asking for a refill.     Previous pharmacotherapy for medical weight loss:  Wegovy    Bariatric surgery:  Patient has not had bariatric surgery    Vit B12 def Taking Vit B12 OTC.  Has missed  taking over the holidays. Notes some fatigue.   Vit D deficiency  She is taking Vit D OTC but has missed taking over the holidays.  Denies side effects.  Denies nausea, vomiting or muscle weakness.  Reports fatigue.  Lab Results  Component Value Date   VD25OH 27.9 (L) 10/04/2022   VD25OH 38.9 07/07/2022   VD25OH 29.0 (L) 02/23/2022     PHYSICAL EXAM:  Blood pressure 110/74, pulse 77, temperature 97.7 F (36.5 C), height 5' 4 (1.626 m), weight 169 lb (76.7 kg), SpO2 97%. Body mass index is 29.01 kg/m.  General: She is overweight, cooperative, alert, well developed, and in no acute distress. PSYCH: Has normal mood, affect and thought process.   Extremities: No edema.  Neurologic: No gross sensory or motor deficits. No tremors or fasciculations noted.    DIAGNOSTIC DATA REVIEWED:  BMET    Component Value Date/Time   NA 142 10/04/2022 0833   K 4.6 10/04/2022 0833   CL 106 10/04/2022 0833   CO2 19 (L) 10/04/2022 0833   GLUCOSE 84 10/04/2022 0833   GLUCOSE 96 06/22/2020 1357   BUN 10 10/04/2022 0833   CREATININE 0.78 10/04/2022 0833   CREATININE 0.67 10/29/2019 0910   CALCIUM 9.9 10/04/2022 0833   GFRNONAA 124 10/29/2019 0910   GFRAA 144  10/29/2019 0910   Lab Results  Component Value Date   HGBA1C 5.2 07/07/2022   HGBA1C 5.7 06/22/2020   Lab Results  Component Value Date   INSULIN  16.3 07/07/2022   INSULIN  13.7 02/23/2022   Lab Results  Component Value Date   TSH 1.210 02/23/2022   CBC    Component Value Date/Time   WBC 7.2 10/04/2022 0833   WBC 9.4 06/22/2020 1357   RBC 4.97 10/04/2022 0833   RBC 4.41 06/22/2020 1357   HGB 14.9 10/04/2022 0833   HCT 44.9 10/04/2022 0833   PLT 382 10/04/2022 0833   MCV 90 10/04/2022 0833   MCH 30.0 10/04/2022 0833   MCH 29.2 10/29/2019 0910   MCHC 33.2 10/04/2022 0833   MCHC 33.7 06/22/2020 1357   RDW 11.8 10/04/2022 0833   Iron Studies    Component Value Date/Time   IRON 105 02/23/2022 0952   TIBC 422  02/23/2022 0952   FERRITIN 87 02/23/2022 0952   IRONPCTSAT 25 02/23/2022 0952   Lipid Panel     Component Value Date/Time   CHOL 193 07/07/2022 0750   TRIG 173 (H) 07/07/2022 0750   HDL 46 07/07/2022 0750   CHOLHDL 4.4 03/22/2021 0930   CHOLHDL 4 06/22/2020 1357   VLDL 40.2 (H) 06/22/2020 1357   LDLCALC 117 (H) 07/07/2022 0750   LDLCALC 141 (H) 10/29/2019 0910   LDLDIRECT 122.0 06/22/2020 1357   Hepatic Function Panel     Component Value Date/Time   PROT 7.8 10/04/2022 0833   ALBUMIN 4.8 10/04/2022 0833   AST 10 10/04/2022 0833   ALT 8 10/04/2022 0833   ALKPHOS 57 10/04/2022 0833   BILITOT 0.3 10/04/2022 0833      Component Value Date/Time   TSH 1.210 02/23/2022 0952   Nutritional Lab Results  Component Value Date   VD25OH 27.9 (L) 10/04/2022   VD25OH 38.9 07/07/2022   VD25OH 29.0 (L) 02/23/2022     ASSESSMENT AND PLAN  TREATMENT PLAN FOR OBESITY:  Recommended Dietary Goals  Haley Salazar is currently in the action stage of change. As such, her goal is to continue weight management plan. She has agreed to keeping a food journal and adhering to recommended goals of 1500 calories and 80+ grams of  protein.  Behavioral Intervention  We discussed the following Behavioral Modification Strategies today: increasing lean protein intake to established goals, decreasing simple carbohydrates , increasing vegetables, increasing water intake , work on meal planning and preparation, work on tracking and journaling calories using tracking application, reading food labels , keeping healthy foods at home, planning for success, and continue to work on maintaining a reduced calorie state, getting the recommended amount of protein, incorporating whole foods, making healthy choices, staying well hydrated and practicing mindfulness when eating..  Additional resources provided today: NA  Recommended Physical Activity Goals  Haley Salazar has been advised to work up to 150 minutes of moderate  intensity aerobic activity a week and strengthening exercises 2-3 times per week for cardiovascular health, weight loss maintenance and preservation of muscle mass.   She has agreed to Think about enjoyable ways to increase daily physical activity and overcoming barriers to exercise, Increase physical activity in their day and reduce sedentary time (increase NEAT)., Increase the intensity, frequency or duration of strengthening exercises , and Increase the intensity, frequency or duration of aerobic exercises     Pharmacotherapy We discussed various medication options to help Berit with her weight loss efforts and we both agreed to increase Wegovy  0.5mg .  Side effects discussed.  ASSOCIATED CONDITIONS ADDRESSED TODAY  Action/Plan  Polyphagia -     Wegovy ; Inject 0.5 mg into the skin once a week.  Dispense: 2 mL; Refill: 0  B12 deficiency Take Vit B12 OTC as directed Will recheck labs in March  Vitamin D  deficiency Take as directed Will recheck labs in March  Nausea and vomiting, unspecified vomiting type -     Ondansetron  HCl; Take 1 tablet (4 mg total) by mouth every 8 (eight) hours as needed for nausea or vomiting.  Dispense: 20 tablet; Refill: 0. Side effects discussed  Generalized obesity -     Wegovy ; Inject 0.5 mg into the skin once a week.  Dispense: 2 mL; Refill: 0  BMI 29.0-29.9,adult     Recheck labs in March.     Return in about 4 weeks (around 02/21/2023).[name] She was informed of the importance of frequent follow up visits to maximize her success with intensive lifestyle modifications for her multiple health conditions.   ATTESTASTION STATEMENTS:  Reviewed by clinician on day of visit: allergies, medications, problem list, medical history, surgical history, family history, social history, and previous encounter notes.     Corean SAUNDERS. Donnamaria Shands FNP-C

## 2023-02-02 ENCOUNTER — Encounter: Payer: Self-pay | Admitting: Nurse Practitioner

## 2023-02-02 ENCOUNTER — Telehealth: Payer: Self-pay

## 2023-02-02 NOTE — Telephone Encounter (Signed)
PA form printed from [employer]. Filled out and faxed to [phone]. Awaiting insurance determination.

## 2023-02-23 ENCOUNTER — Encounter: Payer: Self-pay | Admitting: Nurse Practitioner

## 2023-02-23 ENCOUNTER — Ambulatory Visit: Payer: No Typology Code available for payment source | Admitting: Nurse Practitioner

## 2023-02-23 VITALS — BP 126/89 | HR 88 | Temp 97.7°F | Ht 64.0 in | Wt 167.0 lb

## 2023-02-23 DIAGNOSIS — Z6828 Body mass index (BMI) 28.0-28.9, adult: Secondary | ICD-10-CM | POA: Diagnosis not present

## 2023-02-23 DIAGNOSIS — R632 Polyphagia: Secondary | ICD-10-CM | POA: Diagnosis not present

## 2023-02-23 DIAGNOSIS — E669 Obesity, unspecified: Secondary | ICD-10-CM | POA: Diagnosis not present

## 2023-02-23 NOTE — Progress Notes (Signed)
 Office: [phone]  /  Fax: [phone]  WEIGHT SUMMARY AND BIOMETRICS  Weight Lost Since Last Visit: 2lb  Weight Gained Since Last Visit: 0lb   Vitals Temp: 97.7 F (36.5 C) BP: 126/89 Pulse Rate: 88 SpO2: 99 %   Anthropometric Measurements Height: 5' 4 (1.626 m) Weight: 167 lb (75.8 kg) BMI (Calculated): 28.65 Weight at Last Visit: 169lb Weight Lost Since Last Visit: 2lb Weight Gained Since Last Visit: 0lb Starting Weight: 179lb Total Weight Loss (lbs): 12 lb (5.443 kg)   Body Composition  Body Fat %: 33.8 % Fat Mass (lbs): 56.8 lbs Muscle Mass (lbs): 105.4 lbs Total Body Water (lbs): 74.8 lbs Visceral Fat Rating : 4   Other Clinical Data Fasting: Yes Labs: No Today's Visit #: 16 Starting Date: 02/23/22     HPI  Chief Complaint: OBESITY  Haley Salazar is here to discuss her progress with her obesity treatment plan. She is on the keeping a food journal and adhering to recommended goals of 1700 calories and 80 protein and states she is following her eating plan approximately 85 % of the time. She states she is exercising 45 minutes 4 days per week.   Interval History:  Since last office visit she has lost 2 pounds. Has done well with weight loss since starting the program and taking Wegovy .   She is running and doing resistance training 4 days per week.  She is running the PTI 5k in March.  Notes polyphagia and cravings.  Was taking Wegovy  and doing well.  Has had some problems obtaining from the pharmacy.    Pharmacotherapy for weight loss:  She is not currently taking a medication for medical weight loss.  Took Wegovy  last in January due to problems with insurance.  Helped with polyphagia and cravings.     Previous pharmacotherapy for medical weight loss:  Wegovy    Bariatric surgery:  Patient has not had bariatric surgery      PHYSICAL EXAM:  Blood pressure 126/89, pulse 88, temperature 97.7 F (36.5 C), height 5' 4 (1.626 m), weight 167 lb  (75.8 kg), SpO2 99%. Body mass index is 28.67 kg/m.  General: She is overweight, cooperative, alert, well developed, and in no acute distress. PSYCH: Has normal mood, affect and thought process.   Extremities: No edema.  Neurologic: No gross sensory or motor deficits. No tremors or fasciculations noted.    DIAGNOSTIC DATA REVIEWED:  BMET    Component Value Date/Time   NA 142 10/04/2022 0833   K 4.6 10/04/2022 0833   CL 106 10/04/2022 0833   CO2 19 (L) 10/04/2022 0833   GLUCOSE 84 10/04/2022 0833   GLUCOSE 96 06/22/2020 1357   BUN 10 10/04/2022 0833   CREATININE 0.78 10/04/2022 0833   CREATININE 0.67 10/29/2019 0910   CALCIUM 9.9 10/04/2022 0833   GFRNONAA 124 10/29/2019 0910   GFRAA 144 10/29/2019 0910   Lab Results  Component Value Date   HGBA1C 5.2 07/07/2022   HGBA1C 5.7 06/22/2020   Lab Results  Component Value Date   INSULIN  16.3 07/07/2022   INSULIN  13.7 02/23/2022   Lab Results  Component Value Date   TSH 1.210 02/23/2022   CBC    Component Value Date/Time   WBC 7.2 10/04/2022 0833   WBC 9.4 06/22/2020 1357   RBC 4.97 10/04/2022 0833   RBC 4.41 06/22/2020 1357   HGB 14.9 10/04/2022 0833   HCT 44.9 10/04/2022 0833   PLT 382 10/04/2022 0833   MCV 90 10/04/2022 0833  MCH 30.0 10/04/2022 0833   MCH 29.2 10/29/2019 0910   MCHC 33.2 10/04/2022 0833   MCHC 33.7 06/22/2020 1357   RDW 11.8 10/04/2022 0833   Iron Studies    Component Value Date/Time   IRON 105 02/23/2022 0952   TIBC 422 02/23/2022 0952   FERRITIN 87 02/23/2022 0952   IRONPCTSAT 25 02/23/2022 0952   Lipid Panel     Component Value Date/Time   CHOL 193 07/07/2022 0750   TRIG 173 (H) 07/07/2022 0750   HDL 46 07/07/2022 0750   CHOLHDL 4.4 03/22/2021 0930   CHOLHDL 4 06/22/2020 1357   VLDL 40.2 (H) 06/22/2020 1357   LDLCALC 117 (H) 07/07/2022 0750   LDLCALC 141 (H) 10/29/2019 0910   LDLDIRECT 122.0 06/22/2020 1357   Hepatic Function Panel     Component Value Date/Time   PROT  7.8 10/04/2022 0833   ALBUMIN 4.8 10/04/2022 0833   AST 10 10/04/2022 0833   ALT 8 10/04/2022 0833   ALKPHOS 57 10/04/2022 0833   BILITOT 0.3 10/04/2022 0833      Component Value Date/Time   TSH 1.210 02/23/2022 0952   Nutritional Lab Results  Component Value Date   VD25OH 27.9 (L) 10/04/2022   VD25OH 38.9 07/07/2022   VD25OH 29.0 (L) 02/23/2022     ASSESSMENT AND PLAN  TREATMENT PLAN FOR OBESITY:  Recommended Dietary Goals  Haley Salazar is currently in the action stage of change. As such, her goal is to continue weight management plan. She has agreed to keeping a food journal and adhering to recommended goals of 1700-1800 calories and 100 grams protein.  Behavioral Intervention  We discussed the following Behavioral Modification Strategies today: increasing lean protein intake to established goals, increasing vegetables, increasing water intake , work on meal planning and preparation, work on tracking and journaling calories using tracking application, reading food labels , keeping healthy foods at home, continue to practice mindfulness when eating, planning for success, and continue to work on maintaining a reduced calorie state, getting the recommended amount of protein, incorporating whole foods, making healthy choices, staying well hydrated and practicing mindfulness when eating..  Additional resources provided today: NA  Recommended Physical Activity Goals  Haley Salazar has been advised to work up to 150 minutes of moderate intensity aerobic activity a week and strengthening exercises 2-3 times per week for cardiovascular health, weight loss maintenance and preservation of muscle mass.   She has agreed to Continue current level of physical activity    Pharmacotherapy We discussed various medication options to help Haley Salazar with her weight loss efforts and we both agreed to continue Wegovy .  Will send appeal to insurance.  She has done well with weight loss with dietary  changes, exercise and with the aid of Wegovy .    ASSOCIATED CONDITIONS ADDRESSED TODAY  Action/Plan  Polyphagia Intensive lifestyle modifications are the first line treatment for this issue. We discussed several lifestyle modifications today and she will continue to work on diet, exercise and weight loss efforts. Orders and follow up as documented in patient record.  Counseling Polyphagia is excessive hunger. Causes can include: low blood sugars, hypERthyroidism, PMS, lack of sleep, stress, insulin  resistance, diabetes, certain medications, and diets that are deficient in protein and fiber.    Has done well with Wegovy , dietary changes and exercise.    Generalized obesity  BMI 28.0-28.9,adult       Will check labs in March  Return in about 4 weeks (around 03/23/2023).Haley Salazar She was informed of the importance of  frequent follow up visits to maximize her success with intensive lifestyle modifications for her multiple health conditions.   ATTESTASTION STATEMENTS:  Reviewed by clinician on day of visit: allergies, medications, problem list, medical history, surgical history, family history, social history, and previous encounter notes.   Time spent on visit including pre-visit chart review and post-visit care and charting was 30 minutes.    Haley Salazar. Antjuan Rothe FNP-C

## 2023-02-23 NOTE — Telephone Encounter (Signed)
 Form printed from [employer]. Form and office notes faxed to [employer] at [phone]

## 2023-02-27 ENCOUNTER — Telehealth: Payer: Self-pay

## 2023-02-27 NOTE — Telephone Encounter (Signed)
 Received PA forms from [employer] for Wegovy . Forms filled out and faxed along with medical records to [phone].

## 2023-03-02 NOTE — Telephone Encounter (Signed)
Received fax from [employer] that [name] has been approved from 02/28/23-09/28/23.

## 2023-03-21 ENCOUNTER — Ambulatory Visit: Payer: No Typology Code available for payment source | Admitting: Nurse Practitioner

## 2023-03-21 ENCOUNTER — Encounter: Payer: Self-pay | Admitting: Nurse Practitioner

## 2023-03-21 VITALS — BP 122/78 | HR 83 | Temp 98.2°F | Ht 64.0 in | Wt 172.0 lb

## 2023-03-21 DIAGNOSIS — R632 Polyphagia: Secondary | ICD-10-CM

## 2023-03-21 DIAGNOSIS — R112 Nausea with vomiting, unspecified: Secondary | ICD-10-CM

## 2023-03-21 DIAGNOSIS — E669 Obesity, unspecified: Secondary | ICD-10-CM

## 2023-03-21 DIAGNOSIS — Z6829 Body mass index (BMI) 29.0-29.9, adult: Secondary | ICD-10-CM | POA: Diagnosis not present

## 2023-03-21 MED ORDER — WEGOVY 0.5 MG/0.5ML ~~LOC~~ SOAJ: 0.5000 mg | SUBCUTANEOUS | 0 refills | Status: DC

## 2023-03-21 MED ORDER — ONDANSETRON HCL 4 MG PO TABS
4.0000 mg | ORAL_TABLET | Freq: Three times a day (TID) | ORAL | 0 refills | Status: DC | PRN
Start: 2023-03-21 — End: 2023-08-03

## 2023-03-21 NOTE — Progress Notes (Signed)
 Office: [phone]  /  Fax: [phone]  WEIGHT SUMMARY AND BIOMETRICS  Weight Lost Since Last Visit: 0lb  Weight Gained Since Last Visit: 5lb   Vitals Temp: 98.2 F (36.8 C) BP: 122/78 Pulse Rate: 83 SpO2: 97 %   Anthropometric Measurements Height: 5\' 4"  (1.626 m) Weight: 172 lb (78 kg) BMI (Calculated): 29.51 Weight at Last Visit: 167lb Weight Lost Since Last Visit: 0lb Weight Gained Since Last Visit: 5lb Starting Weight: 179lb Total Weight Loss (lbs): 7 lb (3.175 kg)   Body Composition  Body Fat %: 35.5 % Fat Mass (lbs): 61.2 lbs Muscle Mass (lbs): 105.4 lbs Total Body Water (lbs): 77.6 lbs Visceral Fat Rating : 5   Other Clinical Data Fasting: No Labs: No Today's Visit #: 17 Starting Date: 02/23/22     HPI  Chief Complaint: OBESITY  Haley Salazar is here to discuss her progress with her obesity treatment plan. She is on the keeping a food journal and adhering to recommended goals of 1700 calories and 80 protein and states she is following her eating plan approximately 75 % of the time. She states she is exercising 45 minutes 4 days per week.   Interval History:  Since last office visit she has gained 5 lbs. She feels that her weight gain is due to going out more on the weekends.  She is running 3-4.5 miles 4 days per week and resistance training 1 day per week.    Pharmacotherapy for weight loss: She is currently taking Wegovy 0.5mg  for medical weight loss.  Reports side effects of nausea and uses Zofran PRN.    Haley Salazar has been approved from 02/28/23-09/28/23.   Previous pharmacotherapy for medical weight loss:  [id number]   Bariatric surgery:  Patient has not had bariatric surgery   PHYSICAL EXAM:  Blood pressure 122/78, pulse 83, temperature 98.2 F (36.8 C), height 5\' 4"  (1.626 m), weight 172 lb (78 kg), SpO2 97%. Body mass index is 29.52 kg/m.  General: She is overweight, cooperative, alert, well developed, and in no acute distress. PSYCH:  Has normal mood, affect and thought process.   Extremities: No edema.  Neurologic: No gross sensory or motor deficits. No tremors or fasciculations noted.    DIAGNOSTIC DATA REVIEWED:  BMET    Component Value Date/Time   NA 142 10/04/2022 0833   K 4.6 10/04/2022 0833   CL 106 10/04/2022 0833   CO2 19 (L) 10/04/2022 0833   GLUCOSE 84 10/04/2022 0833   GLUCOSE 96 06/22/2020 1357   BUN 10 10/04/2022 0833   CREATININE 0.78 10/04/2022 0833   CREATININE 0.67 10/29/2019 0910   CALCIUM 9.9 10/04/2022 0833   GFRNONAA 124 10/29/2019 0910   GFRAA 144 10/29/2019 0910   Lab Results  Component Value Date   HGBA1C 5.2 07/07/2022   HGBA1C 5.7 06/22/2020   Lab Results  Component Value Date   INSULIN 16.3 07/07/2022   INSULIN 13.7 02/23/2022   Lab Results  Component Value Date   TSH 1.210 02/23/2022   CBC    Component Value Date/Time   WBC 7.2 10/04/2022 0833   WBC 9.4 06/22/2020 1357   RBC 4.97 10/04/2022 0833   RBC 4.41 06/22/2020 1357   HGB 14.9 10/04/2022 0833   HCT 44.9 10/04/2022 0833   PLT 382 10/04/2022 0833   MCV 90 10/04/2022 0833   MCH 30.0 10/04/2022 0833   MCH 29.2 10/29/2019 0910   MCHC 33.2 10/04/2022 0833   MCHC 33.7 06/22/2020 1357   RDW 11.8 10/04/2022  2703   Iron Studies    Component Value Date/Time   IRON 105 02/23/2022 0952   TIBC 422 02/23/2022 0952   FERRITIN 87 02/23/2022 0952   IRONPCTSAT 25 02/23/2022 0952   Lipid Panel     Component Value Date/Time   CHOL 193 07/07/2022 0750   TRIG 173 (H) 07/07/2022 0750   HDL 46 07/07/2022 0750   CHOLHDL 4.4 03/22/2021 0930   CHOLHDL 4 06/22/2020 1357   VLDL 40.2 (H) 06/22/2020 1357   LDLCALC 117 (H) 07/07/2022 0750   LDLCALC 141 (H) 10/29/2019 0910   LDLDIRECT 122.0 06/22/2020 1357   Hepatic Function Panel     Component Value Date/Time   PROT 7.8 10/04/2022 0833   ALBUMIN 4.8 10/04/2022 0833   AST 10 10/04/2022 0833   ALT 8 10/04/2022 0833   ALKPHOS 57 10/04/2022 0833   BILITOT 0.3  10/04/2022 0833      Component Value Date/Time   TSH 1.210 02/23/2022 0952   Nutritional Lab Results  Component Value Date   VD25OH 27.9 (L) 10/04/2022   VD25OH 38.9 07/07/2022   VD25OH 29.0 (L) 02/23/2022     ASSESSMENT AND PLAN  TREATMENT PLAN FOR OBESITY:  Recommended Dietary Goals  Haley Salazar is currently in the action stage of change. As such, her goal is to continue weight management plan. She has agreed to keeping a food journal and adhering to recommended goals of 1600 calories and 100z+ protein.  Behavioral Intervention  We discussed the following Behavioral Modification Strategies today: increasing lean protein intake to established goals, decreasing simple carbohydrates , increasing vegetables, increasing water intake , work on meal planning and preparation, work on tracking and journaling calories using tracking application, reading food labels , keeping healthy foods at home, continue to work on implementation of reduced calorie nutritional plan, continue to practice mindfulness when eating, planning for success, and continue to work on maintaining a reduced calorie state, getting the recommended amount of protein, incorporating whole foods, making healthy choices, staying well hydrated and practicing mindfulness when eating..  Additional resources provided today: NA  Recommended Physical Activity Goals  Haley Salazar has been advised to work up to 150 minutes of moderate intensity aerobic activity a week and strengthening exercises 2-3 times per week for cardiovascular health, weight loss maintenance and preservation of muscle mass.   She has agreed to Continue current level of physical activity , Think about enjoyable ways to increase daily physical activity and overcoming barriers to exercise, Increase physical activity in their day and reduce sedentary time (increase NEAT)., and Increase the intensity, frequency or duration of strengthening exercises     Pharmacotherapy We discussed various medication options to help Haley Salazar with her weight loss efforts and we both agreed to continue [facility] 0.5mg .  Side effects discussed.  ASSOCIATED CONDITIONS ADDRESSED TODAY  Action/Plan  Polyphagia -     Continue [id number]; Inject 0.5 mg into the skin once a week.  Dispense: 2 mL; Refill: 0.  Side effects discussed.   Nausea and vomiting, unspecified vomiting type -     Ondansetron HCl; Take 1 tablet (4 mg total) by mouth every 8 (eight) hours as needed for nausea or vomiting.  Dispense: 20 tablet; Refill: 0  Generalized obesity -     Wegovy; Inject 0.5 mg into the skin once a week.  Dispense: 2 mL; Refill: 0  BMI 29.0-29.9,adult     Will obtain labs at next visit    Return in about 4 weeks (around 04/18/2023).Marland Kitchen She was informed  of the importance of frequent follow up visits to maximize her success with intensive lifestyle modifications for her multiple health conditions.   ATTESTASTION STATEMENTS:  Reviewed by clinician on day of visit: allergies, medications, problem list, medical history, surgical history, family history, social history, and previous encounter notes.   Theodis Sato. Adwoa Axe FNP-C

## 2023-04-19 ENCOUNTER — Encounter: Payer: Self-pay | Admitting: Nurse Practitioner

## 2023-04-19 ENCOUNTER — Ambulatory Visit: Admitting: Nurse Practitioner

## 2023-04-19 ENCOUNTER — Telehealth: Payer: Self-pay

## 2023-04-19 VITALS — BP 123/80 | HR 60 | Temp 98.4°F | Ht 64.0 in | Wt 166.0 lb

## 2023-04-19 DIAGNOSIS — E538 Deficiency of other specified B group vitamins: Secondary | ICD-10-CM

## 2023-04-19 DIAGNOSIS — R632 Polyphagia: Secondary | ICD-10-CM | POA: Diagnosis not present

## 2023-04-19 DIAGNOSIS — E559 Vitamin D deficiency, unspecified: Secondary | ICD-10-CM

## 2023-04-19 DIAGNOSIS — E7849 Other hyperlipidemia: Secondary | ICD-10-CM | POA: Diagnosis not present

## 2023-04-19 DIAGNOSIS — Z6828 Body mass index (BMI) 28.0-28.9, adult: Secondary | ICD-10-CM

## 2023-04-19 DIAGNOSIS — E88819 Insulin resistance, unspecified: Secondary | ICD-10-CM

## 2023-04-19 DIAGNOSIS — Z79899 Other long term (current) drug therapy: Secondary | ICD-10-CM

## 2023-04-19 DIAGNOSIS — E669 Obesity, unspecified: Secondary | ICD-10-CM

## 2023-04-19 MED ORDER — ZEPBOUND 2.5 MG/0.5ML ~~LOC~~ SOAJ: 2.5000 mg | SUBCUTANEOUS | 0 refills | Status: DC

## 2023-04-19 NOTE — Progress Notes (Signed)
 Office: [phone]  /  Fax: [phone]  WEIGHT SUMMARY AND BIOMETRICS  Weight Lost Since Last Visit: 6lb  Weight Gained Since Last Visit: 0lb   Vitals Temp: 98.4 F (36.9 C) BP: 123/80 Pulse Rate: 60 SpO2: 97 %   Anthropometric Measurements Height: 5\' 4"  (1.626 m) Weight: 166 lb (75.3 kg) BMI (Calculated): 28.48 Weight at Last Visit: 172lb Weight Lost Since Last Visit: 6lb Weight Gained Since Last Visit: 0lb Starting Weight: 179lb Total Weight Loss (lbs): 13 lb (5.897 kg)   Body Composition  Body Fat %: 33.9 % Fat Mass (lbs): 56.6 lbs Muscle Mass (lbs): 104.6 lbs Total Body Water (lbs): 73.6 lbs Visceral Fat Rating : 4   Other Clinical Data Fasting: Yes Labs: Yes Today's Visit #: 18 Starting Date: 02/23/22     HPI  Chief Complaint: OBESITY  Haley Salazar is here to discuss her progress with her obesity treatment plan. She is on the keeping a food journal and adhering to recommended goals of 1700 calories and 80 protein and states she is following her eating plan approximately  75  % of the time. She states she is exercising 30 minutes 4 days per week.   Interval History:  Since last office visit she has lost 6 pounds. She is averaging around 1600-1700 calories and 90-100 grams of protein.   She is drinking water daily.  She has done two 5ks since her last visit.  She is planning to sign up for several more 5K's.  She continues to be active by running several days per week.  Pharmacotherapy for weight loss: She is currently taking Wegovy 0.5mg  for medical weight loss.  Reports side effects of nausea and uses Zofran PRN.  Does not feel comfortable increasing the dose of Wegovy due to side effects of nausea.   [name] has been approved from 02/28/23-09/28/23.    Previous pharmacotherapy for medical weight loss:  [id number]   Bariatric surgery:  Patient has not had bariatric surgery  Vit B12 def Taking Vit B12 OTC.  Has been taking consistently.  Vit D  deficiency  She is taking Vit D OTC.  Denies side effects.  Denies nausea, vomiting or muscle weakness.    Lab Results  Component Value Date   VD25OH 27.9 (L) 10/04/2022   VD25OH 38.9 07/07/2022   VD25OH 29.0 (L) 02/23/2022     Hyperlipidemia Medication(s): none.   Lab Results  Component Value Date   CHOL 193 07/07/2022   HDL 46 07/07/2022   LDLCALC 117 (H) 07/07/2022   LDLDIRECT 122.0 06/22/2020   TRIG 173 (H) 07/07/2022   CHOLHDL 4.4 03/22/2021   Lab Results  Component Value Date   ALT 8 10/04/2022   AST 10 10/04/2022   GGT 9 03/22/2021   ALKPHOS 57 10/04/2022   BILITOT 0.3 10/04/2022   The ASCVD Risk score (Arnett DK, et al., 2019) failed to calculate for the following reasons:   The 2019 ASCVD risk score is only valid for ages 64 to 53     Insulin Resistance Last fasting insulin was 16.3. A1c was 5.2. Polyphagia:Yes Medication(s): Wegovy 0.50 mg SQ weekly Lab Results  Component Value Date   HGBA1C 5.2 07/07/2022   HGBA1C 5.3 02/23/2022   HGBA1C 5.5 03/22/2021   HGBA1C 5.7 06/22/2020   Lab Results  Component Value Date   INSULIN 16.3 07/07/2022   INSULIN 13.7 02/23/2022     PHYSICAL EXAM:  Blood pressure 123/80, pulse 60, temperature 98.4 F (36.9 C), height 5\' 4"  (1.626  m), weight 166 lb (75.3 kg), SpO2 97%. Body mass index is 28.49 kg/m.  General: She is overweight, cooperative, alert, well developed, and in no acute distress. PSYCH: Has normal mood, affect and thought process.   Extremities: No edema.  Neurologic: No gross sensory or motor deficits. No tremors or fasciculations noted.    DIAGNOSTIC DATA REVIEWED:  BMET    Component Value Date/Time   NA 142 10/04/2022 0833   K 4.6 10/04/2022 0833   CL 106 10/04/2022 0833   CO2 19 (L) 10/04/2022 0833   GLUCOSE 84 10/04/2022 0833   GLUCOSE 96 06/22/2020 1357   BUN 10 10/04/2022 0833   CREATININE 0.78 10/04/2022 0833   CREATININE 0.67 10/29/2019 0910   CALCIUM 9.9 10/04/2022 0833    GFRNONAA 124 10/29/2019 0910   GFRAA 144 10/29/2019 0910   Lab Results  Component Value Date   HGBA1C 5.2 07/07/2022   HGBA1C 5.7 06/22/2020   Lab Results  Component Value Date   INSULIN 16.3 07/07/2022   INSULIN 13.7 02/23/2022   Lab Results  Component Value Date   TSH 1.210 02/23/2022   CBC    Component Value Date/Time   WBC 7.2 10/04/2022 0833   WBC 9.4 06/22/2020 1357   RBC 4.97 10/04/2022 0833   RBC 4.41 06/22/2020 1357   HGB 14.9 10/04/2022 0833   HCT 44.9 10/04/2022 0833   PLT 382 10/04/2022 0833   MCV 90 10/04/2022 0833   MCH 30.0 10/04/2022 0833   MCH 29.2 10/29/2019 0910   MCHC 33.2 10/04/2022 0833   MCHC 33.7 06/22/2020 1357   RDW 11.8 10/04/2022 0833   Iron Studies    Component Value Date/Time   IRON 105 02/23/2022 0952   TIBC 422 02/23/2022 0952   FERRITIN 87 02/23/2022 0952   IRONPCTSAT 25 02/23/2022 0952   Lipid Panel     Component Value Date/Time   CHOL 193 07/07/2022 0750   TRIG 173 (H) 07/07/2022 0750   HDL 46 07/07/2022 0750   CHOLHDL 4.4 03/22/2021 0930   CHOLHDL 4 06/22/2020 1357   VLDL 40.2 (H) 06/22/2020 1357   LDLCALC 117 (H) 07/07/2022 0750   LDLCALC 141 (H) 10/29/2019 0910   LDLDIRECT 122.0 06/22/2020 1357   Hepatic Function Panel     Component Value Date/Time   PROT 7.8 10/04/2022 0833   ALBUMIN 4.8 10/04/2022 0833   AST 10 10/04/2022 0833   ALT 8 10/04/2022 0833   ALKPHOS 57 10/04/2022 0833   BILITOT 0.3 10/04/2022 0833      Component Value Date/Time   TSH 1.210 02/23/2022 0952   Nutritional Lab Results  Component Value Date   VD25OH 27.9 (L) 10/04/2022   VD25OH 38.9 07/07/2022   VD25OH 29.0 (L) 02/23/2022     ASSESSMENT AND PLAN  TREATMENT PLAN FOR OBESITY:  Recommended Dietary Goals  Abrea is currently in the action stage of change. As such, her goal is to continue weight management plan. She has agreed to keeping a food journal and adhering to recommended goals of 1700 calories and 75+  protein.  Behavioral Intervention  We discussed the following Behavioral Modification Strategies today: increasing lean protein intake to established goals, decreasing simple carbohydrates , increasing fiber rich foods, increasing water intake , work on meal planning and preparation, work on tracking and journaling calories using tracking application, reading food labels , keeping healthy foods at home, and continue to work on maintaining a reduced calorie state, getting the recommended amount of protein, incorporating whole foods, making  healthy choices, staying well hydrated and practicing mindfulness when eating..  Additional resources provided today: NA  Recommended Physical Activity Goals  Jamel has been advised to work up to 150 minutes of moderate intensity aerobic activity a week and strengthening exercises 2-3 times per week for cardiovascular health, weight loss maintenance and preservation of muscle mass.   She has agreed to Think about enjoyable ways to increase daily physical activity and overcoming barriers to exercise, Increase physical activity in their day and reduce sedentary time (increase NEAT)., Increase the intensity, frequency or duration of strengthening exercises , and Increase the intensity, frequency or duration of aerobic exercises     Pharmacotherapy We discussed various medication options to help [name] with her weight loss efforts and we both agreed to stop Wegovy due to nausea and start Zepbound 2.5mg .  Side effects discussed.  ASSOCIATED CONDITIONS ADDRESSED TODAY  Action/Plan  Polyphagia -     Start Zepbound; Inject 2.5 mg into the skin once a week.  Dispense: 2 mL; Refill: 0.  Side effects discussed.  B12 deficiency -     Vitamin B12  Vitamin D deficiency -     VITAMIN D 25 Hydroxy (Vit-D Deficiency, Fractures)  Other hyperlipidemia -     Lipid Panel With LDL/HDL Ratio  Insulin resistance -     Comprehensive metabolic panel with GFR -      CBC with Differential/Platelet -     TSH -     Insulin, random  Medication management -     Comprehensive metabolic panel with GFR -     CBC with Differential/Platelet -     TSH  Generalized obesity -     Zepbound; Inject 2.5 mg into the skin once a week.  Dispense: 2 mL; Refill: 0 -     Comprehensive metabolic panel with GFR -     CBC with Differential/Platelet -     TSH  BMI 28.0-28.9,adult         Return in about 4 weeks (around 05/17/2023).Marland Kitchen She was informed of the importance of frequent follow up visits to maximize her success with intensive lifestyle modifications for her multiple health conditions.   ATTESTASTION STATEMENTS:  Reviewed by clinician on day of visit: allergies, medications, problem list, medical history, surgical history, family history, social history, and previous encounter notes.   Theodis Sato. Annie Saephan FNP-C

## 2023-04-19 NOTE — Telephone Encounter (Signed)
 PA submitted through Cover My Meds for Zepbound. Awaiting insurance determination. KeyJacques Earthly  Per Cover My Meds: Drug is covered by current benefit plan. No further PA activity needed

## 2023-04-20 LAB — COMPREHENSIVE METABOLIC PANEL WITH GFR
ALT: 15 IU/L (ref 0–32)
AST: 14 IU/L (ref 0–40)
Albumin: 4.8 g/dL (ref 4.0–5.0)
Alkaline Phosphatase: 54 IU/L (ref 44–121)
BUN/Creatinine Ratio: 9 (ref 9–23)
BUN: 6 mg/dL (ref 6–20)
Bilirubin Total: 0.3 mg/dL (ref 0.0–1.2)
CO2: 22 mmol/L (ref 20–29)
Calcium: 9.8 mg/dL (ref 8.7–10.2)
Chloride: 104 mmol/L (ref 96–106)
Creatinine, Ser: 0.69 mg/dL (ref 0.57–1.00)
Globulin, Total: 2.9 g/dL (ref 1.5–4.5)
Glucose: 81 mg/dL (ref 70–99)
Potassium: 4.6 mmol/L (ref 3.5–5.2)
Sodium: 140 mmol/L (ref 134–144)
Total Protein: 7.7 g/dL (ref 6.0–8.5)
eGFR: 123 mL/min/{1.73_m2} (ref >59)

## 2023-04-20 LAB — CBC WITH DIFFERENTIAL/PLATELET
Basophils Absolute: 0 10*3/uL (ref 0.0–0.2)
Basos: 1 %
EOS (ABSOLUTE): 0.1 10*3/uL (ref 0.0–0.4)
Eos: 1 %
Hematocrit: 42 % (ref 34.0–46.6)
Hemoglobin: 13.8 g/dL (ref 11.1–15.9)
Immature Grans (Abs): 0 10*3/uL (ref 0.0–0.1)
Immature Granulocytes: 0 %
Lymphocytes Absolute: 2.2 10*3/uL (ref 0.7–3.1)
Lymphs: 32 %
MCH: 30.5 pg (ref 26.6–33.0)
MCHC: 32.9 g/dL (ref 31.5–35.7)
MCV: 93 fL (ref 79–97)
Monocytes Absolute: 0.4 10*3/uL (ref 0.1–0.9)
Monocytes: 5 %
Neutrophils Absolute: 4.2 10*3/uL (ref 1.4–7.0)
Neutrophils: 61 %
Platelets: 325 10*3/uL (ref 150–450)
RBC: 4.52 x10E6/uL (ref 3.77–5.28)
RDW: 11.9 % (ref 11.7–15.4)
WBC: 6.9 10*3/uL (ref 3.4–10.8)

## 2023-04-20 LAB — VITAMIN B12: Vitamin B-12: 610 pg/mL (ref 232–1245)

## 2023-04-20 LAB — LIPID PANEL WITH LDL/HDL RATIO
Cholesterol, Total: 194 mg/dL (ref 100–199)
HDL: 62 mg/dL (ref >39)
LDL Chol Calc (NIH): 113 mg/dL — ABNORMAL HIGH (ref 0–99)
LDL/HDL Ratio: 1.8 ratio (ref 0.0–3.2)
Triglycerides: 106 mg/dL (ref 0–149)
VLDL Cholesterol Cal: 19 mg/dL (ref 5–40)

## 2023-04-20 LAB — VITAMIN D 25 HYDROXY (VIT D DEFICIENCY, FRACTURES): Vit D, 25-Hydroxy: 31.8 ng/mL (ref 30.0–100.0)

## 2023-04-20 LAB — INSULIN, RANDOM: INSULIN: 16.5 u[IU]/mL (ref 2.6–24.9)

## 2023-04-20 LAB — TSH: TSH: 1.38 u[IU]/mL (ref 0.450–4.500)

## 2023-04-22 ENCOUNTER — Other Ambulatory Visit: Payer: Self-pay | Admitting: Nurse Practitioner

## 2023-04-22 DIAGNOSIS — E669 Obesity, unspecified: Secondary | ICD-10-CM

## 2023-04-22 DIAGNOSIS — R632 Polyphagia: Secondary | ICD-10-CM

## 2023-04-24 NOTE — Telephone Encounter (Signed)
  Download Request  Print Thank you for creating a prior authorization request using Prompt[employer]. The prior authorization department will contact you if additional information is needed. Once a decision is made you will be notified of the decision. You can check the status of your request using the Check Status link. Please note down (Prior Auth EOC ID) to check status. Prior Authorization request details: Prior Auth (EOC) ID: [id number] Drug/Service Name: Reginia Forts 2.5 MG/0.5 ML PEN Patient: Haley Salazar Date Requested: 04/24/2023 15:39:12   MemberID: [id number] DOB: 02/23/1995

## 2023-05-08 ENCOUNTER — Other Ambulatory Visit: Payer: Self-pay | Admitting: Nurse Practitioner

## 2023-05-08 DIAGNOSIS — E538 Deficiency of other specified B group vitamins: Secondary | ICD-10-CM

## 2023-05-25 ENCOUNTER — Ambulatory Visit: Admitting: Nurse Practitioner

## 2023-05-25 ENCOUNTER — Encounter: Payer: Self-pay | Admitting: Nurse Practitioner

## 2023-05-25 VITALS — BP 114/71 | HR 76 | Temp 98.1°F | Ht 64.0 in | Wt 167.0 lb

## 2023-05-25 DIAGNOSIS — E559 Vitamin D deficiency, unspecified: Secondary | ICD-10-CM | POA: Diagnosis not present

## 2023-05-25 DIAGNOSIS — E669 Obesity, unspecified: Secondary | ICD-10-CM | POA: Diagnosis not present

## 2023-05-25 DIAGNOSIS — R632 Polyphagia: Secondary | ICD-10-CM

## 2023-05-25 DIAGNOSIS — E538 Deficiency of other specified B group vitamins: Secondary | ICD-10-CM

## 2023-05-25 DIAGNOSIS — Z6828 Body mass index (BMI) 28.0-28.9, adult: Secondary | ICD-10-CM

## 2023-05-25 MED ORDER — ZEPBOUND 2.5 MG/0.5ML ~~LOC~~ SOAJ: 2.5000 mg | SUBCUTANEOUS | 0 refills | Status: DC

## 2023-05-25 NOTE — Progress Notes (Signed)
 Office: [phone]  /  Fax: [phone]  WEIGHT SUMMARY AND BIOMETRICS  Weight Lost Since Last Visit: 0lb  Weight Gained Since Last Visit: 1lb   Vitals Temp: 98.1 F (36.7 C) BP: 114/71 Pulse Rate: 76 SpO2: 98 %   Anthropometric Measurements Height: 5\' 4"  (1.626 m) Weight: 167 lb (75.8 kg) BMI (Calculated): 28.65 Weight at Last Visit: 166lb Weight Lost Since Last Visit: 0lb Weight Gained Since Last Visit: 1lb Starting Weight: 179lb Total Weight Loss (lbs): 12 lb (5.443 kg)   Body Composition  Body Fat %: 33.2 % Fat Mass (lbs): 55.8 lbs Muscle Mass (lbs): 106.4 lbs Total Body Water (lbs): 73.4 lbs Visceral Fat Rating : 4   Other Clinical Data Fasting: Yes Labs: No Today's Visit #: 19 Starting Date: 02/23/22     HPI  Chief Complaint: OBESITY  Haley Salazar is here to discuss her progress with her obesity treatment plan. She is on the keeping a food journal and adhering to recommended goals of 1700 calories and 80 protein and states she is following her eating plan approximately 75 % of the time. She states she is exercising 45 minutes 4 days per week.   Interval History:  Since last office visit she has gained 1 pound.  She is planning to sign up for several more 5K's. She continues to be active running several days per week.    Pharmacotherapy for weight loss: She is currently taking Zepbound  2.5mg  (x 3 doses) for medical weight loss.  Reports side effects of some nausea and vomiting the second week.  Has gotten better.  Takes Zofran  PRN.  Feels better on Zepbound  than she did on Wegovy .    Zepbound  has been approved through 04/23/24.   Previous pharmacotherapy for medical weight loss:  Wegovy    Bariatric surgery:  Patient has not had bariatric surgery  Vit D deficiency  She is taking Vit D 1,000 IU daily.  Denies side effects.  Denies nausea, vomiting or muscle weakness.    Lab Results  Component Value Date   VD25OH 31.8 04/19/2023   VD25OH 27.9  (L) 10/04/2022   VD25OH 38.9 07/07/2022    Vit B12 def Taking Vit B12 OTC  PHYSICAL EXAM:  Blood pressure 114/71, pulse 76, temperature 98.1 F (36.7 C), height 5\' 4"  (1.626 m), weight 167 lb (75.8 kg), SpO2 98%. Body mass index is 28.67 kg/m.  General: She is overweight, cooperative, alert, well developed, and in no acute distress. PSYCH: Has normal mood, affect and thought process.   Extremities: No edema.  Neurologic: No gross sensory or motor deficits. No tremors or fasciculations noted.    DIAGNOSTIC DATA REVIEWED:  BMET    Component Value Date/Time   NA 140 04/19/2023 0756   K 4.6 04/19/2023 0756   CL 104 04/19/2023 0756   CO2 22 04/19/2023 0756   GLUCOSE 81 04/19/2023 0756   GLUCOSE 96 06/22/2020 1357   BUN 6 04/19/2023 0756   CREATININE 0.69 04/19/2023 0756   CREATININE 0.67 10/29/2019 0910   CALCIUM 9.8 04/19/2023 0756   GFRNONAA 124 10/29/2019 0910   GFRAA 144 10/29/2019 0910   Lab Results  Component Value Date   HGBA1C 5.2 07/07/2022   HGBA1C 5.7 06/22/2020   Lab Results  Component Value Date   INSULIN  16.5 04/19/2023   INSULIN  13.7 02/23/2022   Lab Results  Component Value Date   TSH 1.380 04/19/2023   CBC    Component Value Date/Time   WBC 6.9 04/19/2023 0756  WBC 9.4 06/22/2020 1357   RBC 4.52 04/19/2023 0756   RBC 4.41 06/22/2020 1357   HGB 13.8 04/19/2023 0756   HCT 42.0 04/19/2023 0756   PLT 325 04/19/2023 0756   MCV 93 04/19/2023 0756   MCH 30.5 04/19/2023 0756   MCH 29.2 10/29/2019 0910   MCHC 32.9 04/19/2023 0756   MCHC 33.7 06/22/2020 1357   RDW 11.9 04/19/2023 0756   Iron Studies    Component Value Date/Time   IRON 105 02/23/2022 0952   TIBC 422 02/23/2022 0952   FERRITIN 87 02/23/2022 0952   IRONPCTSAT 25 02/23/2022 0952   Lipid Panel     Component Value Date/Time   CHOL 194 04/19/2023 0756   TRIG 106 04/19/2023 0756   HDL 62 04/19/2023 0756   CHOLHDL 4.4 03/22/2021 0930   CHOLHDL 4 06/22/2020 1357   VLDL 40.2  (H) 06/22/2020 1357   LDLCALC 113 (H) 04/19/2023 0756   LDLCALC 141 (H) 10/29/2019 0910   LDLDIRECT 122.0 06/22/2020 1357   Hepatic Function Panel     Component Value Date/Time   PROT 7.7 04/19/2023 0756   ALBUMIN 4.8 04/19/2023 0756   AST 14 04/19/2023 0756   ALT 15 04/19/2023 0756   ALKPHOS 54 04/19/2023 0756   BILITOT 0.3 04/19/2023 0756      Component Value Date/Time   TSH 1.380 04/19/2023 0756   Nutritional Lab Results  Component Value Date   VD25OH 31.8 04/19/2023   VD25OH 27.9 (L) 10/04/2022   VD25OH 38.9 07/07/2022     ASSESSMENT AND PLAN  TREATMENT PLAN FOR OBESITY:  Recommended Dietary Goals  Camily is currently in the action stage of change. As such, her goal is to continue weight management plan. She has agreed to keeping a food journal and adhering to recommended goals of 1700 calories and 90+ protein.  Behavioral Intervention  We discussed the following Behavioral Modification Strategies today: increasing lean protein intake to established goals, decreasing simple carbohydrates , increasing vegetables, increasing fiber rich foods, increasing water intake , and continue to work on maintaining a reduced calorie state, getting the recommended amount of protein, incorporating whole foods, making healthy choices, staying well hydrated and practicing mindfulness when eating..  Additional resources provided today: NA  Recommended Physical Activity Goals  Sindia has been advised to work up to 150 minutes of moderate intensity aerobic activity a week and strengthening exercises 2-3 times per week for cardiovascular health, weight loss maintenance and preservation of muscle mass.   She has agreed to Continue current level of physical activity    Pharmacotherapy We discussed various medication options to help [name] with her weight loss efforts and we both agreed to continue Zepbound  2.5mg .  Side effects discussed.  To let me know if nausea and/or vomiting  persists/continues.  Will try Prilosec  OTC for the next 2 weeks.    ASSOCIATED CONDITIONS ADDRESSED TODAY  Action/Plan  Polyphagia -     Zepbound ; Inject 2.5 mg into the skin once a week.  Dispense: 2 mL; Refill: 0  Vitamin D  deficiency Continue Vit D OTC  B12 deficiency Continue Vit B12 OTC  Generalized obesity -     Zepbound ; Inject 2.5 mg into the skin once a week.  Dispense: 2 mL; Refill: 0  BMI 28.0-28.9,adult      Labs reviewed in chart with patient from 04/19/23   Return in about 4 weeks (around 06/22/2023).Aaron Aas She was informed of the importance of frequent follow up visits to maximize her success with intensive  lifestyle modifications for her multiple health conditions.   ATTESTASTION STATEMENTS:  Reviewed by clinician on day of visit: allergies, medications, problem list, medical history, surgical history, family history, social history, and previous encounter notes.     Crist Dominion. Zaelyn Noack FNP-C

## 2023-06-29 ENCOUNTER — Ambulatory Visit: Admitting: Nurse Practitioner

## 2023-06-29 VITALS — BP 121/68 | HR 56 | Temp 98.1°F | Ht 64.0 in | Wt 164.0 lb

## 2023-06-29 DIAGNOSIS — Z6828 Body mass index (BMI) 28.0-28.9, adult: Secondary | ICD-10-CM

## 2023-06-29 DIAGNOSIS — E669 Obesity, unspecified: Secondary | ICD-10-CM | POA: Diagnosis not present

## 2023-06-29 DIAGNOSIS — R632 Polyphagia: Secondary | ICD-10-CM | POA: Diagnosis not present

## 2023-06-29 MED ORDER — ZEPBOUND 2.5 MG/0.5ML ~~LOC~~ SOAJ: 2.5000 mg | SUBCUTANEOUS | 0 refills | Status: DC

## 2023-06-29 NOTE — Progress Notes (Signed)
 Office: [phone]  /  Fax: [phone]  WEIGHT SUMMARY AND BIOMETRICS  Weight Lost Since Last Visit: 3lb  Weight Gained Since Last Visit: 0lb   Vitals Temp: 98.1 F (36.7 C) BP: 121/68 Pulse Rate: (!) 56 SpO2: 97 %   Anthropometric Measurements Height: 5' 4 (1.626 m) Weight: 164 lb (74.4 kg) BMI (Calculated): 28.14 Weight at Last Visit: 167lb Weight Lost Since Last Visit: 3lb Weight Gained Since Last Visit: 0lb Starting Weight: 179lb Total Weight Loss (lbs): 15 lb (6.804 kg)   Body Composition  Body Fat %: 31.9 % Fat Mass (lbs): 52.4 lbs Muscle Mass (lbs): 106.4 lbs Total Body Water (lbs): 73.2 lbs Visceral Fat Rating : 4   Other Clinical Data Fasting: Yes Labs: No Today's Visit #: 20 Starting Date: 02/23/22     HPI  Chief Complaint: OBESITY  Haley Salazar is here to discuss her progress with her obesity treatment plan. She is on the keeping a food journal and adhering to recommended goals of 1700 calories and 80 protein and states she is following her eating plan approximately 80 % of the time. She states she is exercising 45 minutes 4 days per week.   Interval History:  Since last office visit she has lost 3 pounds. She is doing well with meeting calories and protein goals.  She is running 4 days per week (15 miles per week) and doing resistance training.   She is going to the beach for [place] 4th.   Pharmacotherapy for weight loss: She is currently taking Zepbound  2.5mg  every 7-12 days for medical weight loss.  Reports side effects of some nausea and states is not as bad as it was on Wegovy .  Notes some fatigue. Takes Zofran  PRN.  Feels better on Zepbound  than she did on Wegovy .     Zepbound  has been approved through 04/23/24.    Previous pharmacotherapy for medical weight loss:  Wegovy    Bariatric surgery:  Patient has not had bariatric surgery  PHYSICAL EXAM:  Blood pressure 121/68, pulse (!) 56, temperature 98.1 F (36.7 C), height 5' 4  (1.626 m), weight 164 lb (74.4 kg), SpO2 97%. Body mass index is 28.15 kg/m.  General: She is overweight, cooperative, alert, well developed, and in no acute distress. PSYCH: Has normal mood, affect and thought process.   Extremities: No edema.  Neurologic: No gross sensory or motor deficits. No tremors or fasciculations noted.    DIAGNOSTIC DATA REVIEWED:  BMET    Component Value Date/Time   NA 140 04/19/2023 0756   K 4.6 04/19/2023 0756   CL 104 04/19/2023 0756   CO2 22 04/19/2023 0756   GLUCOSE 81 04/19/2023 0756   GLUCOSE 96 06/22/2020 1357   BUN 6 04/19/2023 0756   CREATININE 0.69 04/19/2023 0756   CREATININE 0.67 10/29/2019 0910   CALCIUM 9.8 04/19/2023 0756   GFRNONAA 124 10/29/2019 0910   GFRAA 144 10/29/2019 0910   Lab Results  Component Value Date   HGBA1C 5.2 07/07/2022   HGBA1C 5.7 06/22/2020   Lab Results  Component Value Date   INSULIN  16.5 04/19/2023   INSULIN  13.7 02/23/2022   Lab Results  Component Value Date   TSH 1.380 04/19/2023   CBC    Component Value Date/Time   WBC 6.9 04/19/2023 0756   WBC 9.4 06/22/2020 1357   RBC 4.52 04/19/2023 0756   RBC 4.41 06/22/2020 1357   HGB 13.8 04/19/2023 0756   HCT 42.0 04/19/2023 0756   PLT 325 04/19/2023 0756  MCV 93 04/19/2023 0756   MCH 30.5 04/19/2023 0756   MCH 29.2 10/29/2019 0910   MCHC 32.9 04/19/2023 0756   MCHC 33.7 06/22/2020 1357   RDW 11.9 04/19/2023 0756   Iron Studies    Component Value Date/Time   IRON 105 02/23/2022 0952   TIBC 422 02/23/2022 0952   FERRITIN 87 02/23/2022 0952   IRONPCTSAT 25 02/23/2022 0952   Lipid Panel     Component Value Date/Time   CHOL 194 04/19/2023 0756   TRIG 106 04/19/2023 0756   HDL 62 04/19/2023 0756   CHOLHDL 4.4 03/22/2021 0930   CHOLHDL 4 06/22/2020 1357   VLDL 40.2 (H) 06/22/2020 1357   LDLCALC 113 (H) 04/19/2023 0756   LDLCALC 141 (H) 10/29/2019 0910   LDLDIRECT 122.0 06/22/2020 1357   Hepatic Function Panel     Component Value  Date/Time   PROT 7.7 04/19/2023 0756   ALBUMIN 4.8 04/19/2023 0756   AST 14 04/19/2023 0756   ALT 15 04/19/2023 0756   ALKPHOS 54 04/19/2023 0756   BILITOT 0.3 04/19/2023 0756      Component Value Date/Time   TSH 1.380 04/19/2023 0756   Nutritional Lab Results  Component Value Date   VD25OH 31.8 04/19/2023   VD25OH 27.9 (L) 10/04/2022   VD25OH 38.9 07/07/2022     ASSESSMENT AND PLAN  TREATMENT PLAN FOR OBESITY:  Recommended Dietary Goals  Haley Salazar is currently in the action stage of change. As such, her goal is to continue weight management plan. She has agreed to keeping a food journal and adhering to recommended goals of 1700 calories and 90 protein.  Behavioral Intervention  We discussed the following Behavioral Modification Strategies today: increasing lean protein intake to established goals, decreasing simple carbohydrates , increasing vegetables, increasing fiber rich foods, increasing water intake , work on meal planning and preparation, reading food labels , keeping healthy foods at home, and continue to work on maintaining a reduced calorie state, getting the recommended amount of protein, incorporating whole foods, making healthy choices, staying well hydrated and practicing mindfulness when eating..  Additional resources provided today: NA  Recommended Physical Activity Goals  Haley Salazar has been advised to work up to 150 minutes of moderate intensity aerobic activity a week and strengthening exercises 2-3 times per week for cardiovascular health, weight loss maintenance and preservation of muscle mass.   She has agreed to Continue current level of physical activity    Pharmacotherapy We discussed various medication options to help Haley Salazar with her weight loss efforts and we both agreed to continue Zepbound  2.5mg .  Side effects discussed .  ASSOCIATED CONDITIONS ADDRESSED TODAY  Action/Plan  Polyphagia -     Zepbound ; Inject 2.5 mg into the skin once a  week.  Dispense: 2 mL; Refill: 0  Generalized obesity -     Zepbound ; Inject 2.5 mg into the skin once a week.  Dispense: 2 mL; Refill: 0  BMI 28.0-28.9,adult         Return in about 4 weeks (around 07/27/2023).Aaron Aas She was informed of the importance of frequent follow up visits to maximize her success with intensive lifestyle modifications for her multiple health conditions.   ATTESTASTION STATEMENTS:  Reviewed by clinician on day of visit: allergies, medications, problem list, medical history, surgical history, family history, social history, and previous encounter notes.       Crist Dominion. Damari Hiltz FNP-C

## 2023-08-03 ENCOUNTER — Ambulatory Visit: Admitting: Nurse Practitioner

## 2023-08-03 ENCOUNTER — Encounter: Payer: Self-pay | Admitting: Nurse Practitioner

## 2023-08-03 VITALS — BP 113/76 | HR 68 | Temp 98.1°F | Ht 64.0 in | Wt 164.0 lb

## 2023-08-03 DIAGNOSIS — Z6828 Body mass index (BMI) 28.0-28.9, adult: Secondary | ICD-10-CM | POA: Diagnosis not present

## 2023-08-03 DIAGNOSIS — E669 Obesity, unspecified: Secondary | ICD-10-CM | POA: Diagnosis not present

## 2023-08-03 DIAGNOSIS — R112 Nausea with vomiting, unspecified: Secondary | ICD-10-CM | POA: Diagnosis not present

## 2023-08-03 DIAGNOSIS — R632 Polyphagia: Secondary | ICD-10-CM

## 2023-08-03 MED ORDER — ZEPBOUND 5 MG/0.5ML ~~LOC~~ SOAJ: 5.0000 mg | SUBCUTANEOUS | 0 refills | Status: DC

## 2023-08-03 MED ORDER — ONDANSETRON HCL 4 MG PO TABS: 4.0000 mg | ORAL_TABLET | Freq: Three times a day (TID) | ORAL | 0 refills | Status: DC | PRN

## 2023-08-03 NOTE — Progress Notes (Signed)
 Office: [phone]  /  Fax: [phone]  WEIGHT SUMMARY AND BIOMETRICS  Weight Lost Since Last Visit: 0lb  Weight Gained Since Last Visit: 0lb   Vitals Temp: 98.1 F (36.7 C) BP: 113/76 Pulse Rate: 68 SpO2: 97 %   Anthropometric Measurements Height: 5' 4 (1.626 m) Weight: 164 lb (74.4 kg) BMI (Calculated): 28.14 Weight at Last Visit: 164lb Weight Lost Since Last Visit: 0lb Weight Gained Since Last Visit: 0lb Starting Weight: 179lb Total Weight Loss (lbs): 15 lb (6.804 kg)   Body Composition  Body Fat %: 33.2 % Fat Mass (lbs): 54.6 lbs Muscle Mass (lbs): 104.2 lbs Total Body Water (lbs): 72 lbs Visceral Fat Rating : 4   Other Clinical Data Fasting: Yes Labs: No Today's Visit #: 21 Starting Date: 02/23/22     HPI  Chief Complaint: OBESITY  Haley Salazar is here to discuss her progress with her obesity treatment plan. She is on the keeping a food journal and adhering to recommended goals of 1700 calories and 80 protein and states she is following her eating plan approximately 75 % of the time. She states she is exercising 45 minutes 4 days per week.   Interval History:  Since last office visit she has maintained her weight.  She is not currently tracking.  She is running 4 days 2-5 miles and going to the gym 2 days per week.  She is drinking water and some ginger ale.  Notes polyphagia and cravings.    Pharmacotherapy for weight loss: She is currently taking Zepbound  2.5mg  every 10 days for medical weight loss.  Reports some side effects of nausea.     Zepbound  has been approved through 04/23/24.    Previous pharmacotherapy for medical weight loss:  Wegovy   PHYSICAL EXAM:  Blood pressure 113/76, pulse 68, temperature 98.1 F (36.7 C), height 5' 4 (1.626 m), weight 164 lb (74.4 kg), SpO2 97%. Body mass index is 28.15 kg/m.  General: She is overweight, cooperative, alert, well developed, and in no acute distress. PSYCH: Has normal mood, affect and  thought process.   Extremities: No edema.  Neurologic: No gross sensory or motor deficits. No tremors or fasciculations noted.    DIAGNOSTIC DATA REVIEWED:  BMET    Component Value Date/Time   NA 140 04/19/2023 0756   K 4.6 04/19/2023 0756   CL 104 04/19/2023 0756   CO2 22 04/19/2023 0756   GLUCOSE 81 04/19/2023 0756   GLUCOSE 96 06/22/2020 1357   BUN 6 04/19/2023 0756   CREATININE 0.69 04/19/2023 0756   CREATININE 0.67 10/29/2019 0910   CALCIUM 9.8 04/19/2023 0756   GFRNONAA 124 10/29/2019 0910   GFRAA 144 10/29/2019 0910   Lab Results  Component Value Date   HGBA1C 5.2 07/07/2022   HGBA1C 5.7 06/22/2020   Lab Results  Component Value Date   INSULIN  16.5 04/19/2023   INSULIN  13.7 02/23/2022   Lab Results  Component Value Date   TSH 1.380 04/19/2023   CBC    Component Value Date/Time   WBC 6.9 04/19/2023 0756   WBC 9.4 06/22/2020 1357   RBC 4.52 04/19/2023 0756   RBC 4.41 06/22/2020 1357   HGB 13.8 04/19/2023 0756   HCT 42.0 04/19/2023 0756   PLT 325 04/19/2023 0756   MCV 93 04/19/2023 0756   MCH 30.5 04/19/2023 0756   MCH 29.2 10/29/2019 0910   MCHC 32.9 04/19/2023 0756   MCHC 33.7 06/22/2020 1357   RDW 11.9 04/19/2023 0756   Iron Studies  Component Value Date/Time   IRON 105 02/23/2022 0952   TIBC 422 02/23/2022 0952   FERRITIN 87 02/23/2022 0952   IRONPCTSAT 25 02/23/2022 0952   Lipid Panel     Component Value Date/Time   CHOL 194 04/19/2023 0756   TRIG 106 04/19/2023 0756   HDL 62 04/19/2023 0756   CHOLHDL 4.4 03/22/2021 0930   CHOLHDL 4 06/22/2020 1357   VLDL 40.2 (H) 06/22/2020 1357   LDLCALC 113 (H) 04/19/2023 0756   LDLCALC 141 (H) 10/29/2019 0910   LDLDIRECT 122.0 06/22/2020 1357   Hepatic Function Panel     Component Value Date/Time   PROT 7.7 04/19/2023 0756   ALBUMIN 4.8 04/19/2023 0756   AST 14 04/19/2023 0756   ALT 15 04/19/2023 0756   ALKPHOS 54 04/19/2023 0756   BILITOT 0.3 04/19/2023 0756      Component Value  Date/Time   TSH 1.380 04/19/2023 0756   Nutritional Lab Results  Component Value Date   VD25OH 31.8 04/19/2023   VD25OH 27.9 (L) 10/04/2022   VD25OH 38.9 07/07/2022     ASSESSMENT AND PLAN  TREATMENT PLAN FOR OBESITY:  Recommended Dietary Goals  [place] is currently in the action stage of change. As such, her goal is to continue weight management plan. She has agreed to keeping a food journal and adhering to recommended goals of 1700 calories and 100 protein.  Behavioral Intervention  We discussed the following Behavioral Modification Strategies today: increasing lean protein intake to established goals, decreasing simple carbohydrates , increasing vegetables, increasing fiber rich foods, increasing water intake , work on meal planning and preparation, reading food labels , keeping healthy foods at home, and continue to work on maintaining a reduced calorie state, getting the recommended amount of protein, incorporating whole foods, making healthy choices, staying well hydrated and practicing mindfulness when eating..  Additional resources provided today: NA  Recommended Physical Activity Goals  [place] has been advised to work up to 150 minutes of moderate intensity aerobic activity a week and strengthening exercises 2-3 times per week for cardiovascular health, weight loss maintenance and preservation of muscle mass.   She has agreed to Continue current level of physical activity    Pharmacotherapy We discussed various medication options to help [name] with her weight loss efforts and we both agreed to increase Zepbound  5mg .  Side effects discussed.  ASSOCIATED CONDITIONS ADDRESSED TODAY  Action/Plan  Polyphagia -     Zepbound ; Inject 5 mg into the skin once a week.  Dispense: 2 mL; Refill: 0  Nausea and vomiting, unspecified vomiting type -     Ondansetron  HCl; Take 1 tablet (4 mg total) by mouth every 8 (eight) hours as needed for nausea or vomiting.  Dispense: 20  tablet; Refill: 0.  Side effects discussed   Generalized obesity -     Zepbound ; Inject 5 mg into the skin once a week.  Dispense: 2 mL; Refill: 0  BMI 28.0-28.9,adult     Start a probiotic   Return in about 4 weeks (around 08/31/2023).Haley Salazar She was informed of the importance of frequent follow up visits to maximize her success with intensive lifestyle modifications for her multiple health conditions.   ATTESTASTION STATEMENTS:  Reviewed by clinician on day of visit: allergies, medications, problem list, medical history, surgical history, family history, social history, and previous encounter notes.    Corean SAUNDERS. Anel Purohit FNP-C

## 2023-09-14 ENCOUNTER — Ambulatory Visit (INDEPENDENT_AMBULATORY_CARE_PROVIDER_SITE_OTHER): Admitting: Nurse Practitioner

## 2023-09-14 ENCOUNTER — Encounter: Payer: Self-pay | Admitting: Nurse Practitioner

## 2023-09-14 VITALS — BP 120/84 | HR 86 | Temp 97.9°F | Ht 64.0 in | Wt 159.0 lb

## 2023-09-14 DIAGNOSIS — Z6827 Body mass index (BMI) 27.0-27.9, adult: Secondary | ICD-10-CM

## 2023-09-14 DIAGNOSIS — R632 Polyphagia: Secondary | ICD-10-CM

## 2023-09-14 DIAGNOSIS — E669 Obesity, unspecified: Secondary | ICD-10-CM | POA: Diagnosis not present

## 2023-09-14 MED ORDER — ZEPBOUND 2.5 MG/0.5ML ~~LOC~~ SOAJ: 2.5000 mg | SUBCUTANEOUS | 0 refills | Status: DC

## 2023-09-14 NOTE — Progress Notes (Signed)
 Office: [phone]  /  Fax: [phone]  WEIGHT SUMMARY AND BIOMETRICS  Weight Lost Since Last Visit: 5lb  Weight Gained Since Last Visit: 0lb   Vitals Temp: 97.9 F (36.6 C) BP: 120/84 Pulse Rate: 86 SpO2: 97 %   Anthropometric Measurements Height: 5' 4 (1.626 m) Weight: 159 lb (72.1 kg) BMI (Calculated): 27.28 Weight at Last Visit: 164lb Weight Lost Since Last Visit: 5lb Weight Gained Since Last Visit: 0lb Starting Weight: 179lb Total Weight Loss (lbs): 20 lb (9.072 kg)   Body Composition  Body Fat %: 32.6 % Fat Mass (lbs): 51.8 lbs Muscle Mass (lbs): 102 lbs Total Body Water (lbs): 72.4 lbs Visceral Fat Rating : 4   Other Clinical Data Fasting: Yes Labs: No Today's Visit #: 22 Starting Date: 02/23/22     HPI  Chief Complaint: OBESITY  Haley Salazar is here to discuss her progress with her obesity treatment plan. She is on the keeping a food journal and adhering to recommended goals of 1700 calories and 80 protein and states she is following her eating plan approximately 50 % of the time. She states she is exercising 60 minutes 2 days per week.   Interval History:  Since last office visit she has lost 5 pounds.  She moved since her last visit.  She ran a 5k and is walking, running and playing pickleball. She is drinking water daily.    Pharmacotherapy for weight loss: She is currently taking Zepbound  5mg  every 9-10 days for medical weight loss.  Reports some side effects of nausea.  Since increasing dose she is struggling to meet calories and protein goals.       Zepbound  has been approved through 04/23/24.    Previous pharmacotherapy for medical weight loss:  Wegovy    PHYSICAL EXAM:  Blood pressure 120/84, pulse 86, temperature 97.9 F (36.6 C), height 5' 4 (1.626 m), weight 159 lb (72.1 kg), SpO2 97%. Body mass index is 27.29 kg/m.  General: She is overweight, cooperative, alert, well developed, and in no acute distress. PSYCH: Has normal  mood, affect and thought process.   Extremities: No edema.  Neurologic: No gross sensory or motor deficits. No tremors or fasciculations noted.    DIAGNOSTIC DATA REVIEWED:  BMET    Component Value Date/Time   NA 140 04/19/2023 0756   K 4.6 04/19/2023 0756   CL 104 04/19/2023 0756   CO2 22 04/19/2023 0756   GLUCOSE 81 04/19/2023 0756   GLUCOSE 96 06/22/2020 1357   BUN 6 04/19/2023 0756   CREATININE 0.69 04/19/2023 0756   CREATININE 0.67 10/29/2019 0910   CALCIUM 9.8 04/19/2023 0756   GFRNONAA 124 10/29/2019 0910   GFRAA 144 10/29/2019 0910   Lab Results  Component Value Date   HGBA1C 5.2 07/07/2022   HGBA1C 5.7 06/22/2020   Lab Results  Component Value Date   INSULIN  16.5 04/19/2023   INSULIN  13.7 02/23/2022   Lab Results  Component Value Date   TSH 1.380 04/19/2023   CBC    Component Value Date/Time   WBC 6.9 04/19/2023 0756   WBC 9.4 06/22/2020 1357   RBC 4.52 04/19/2023 0756   RBC 4.41 06/22/2020 1357   HGB 13.8 04/19/2023 0756   HCT 42.0 04/19/2023 0756   PLT 325 04/19/2023 0756   MCV 93 04/19/2023 0756   MCH 30.5 04/19/2023 0756   MCH 29.2 10/29/2019 0910   MCHC 32.9 04/19/2023 0756   MCHC 33.7 06/22/2020 1357   RDW 11.9 04/19/2023 0756  Iron Studies    Component Value Date/Time   IRON 105 02/23/2022 0952   TIBC 422 02/23/2022 0952   FERRITIN 87 02/23/2022 0952   IRONPCTSAT 25 02/23/2022 0952   Lipid Panel     Component Value Date/Time   CHOL 194 04/19/2023 0756   TRIG 106 04/19/2023 0756   HDL 62 04/19/2023 0756   CHOLHDL 4.4 03/22/2021 0930   CHOLHDL 4 06/22/2020 1357   VLDL 40.2 (H) 06/22/2020 1357   LDLCALC 113 (H) 04/19/2023 0756   LDLCALC 141 (H) 10/29/2019 0910   LDLDIRECT 122.0 06/22/2020 1357   Hepatic Function Panel     Component Value Date/Time   PROT 7.7 04/19/2023 0756   ALBUMIN 4.8 04/19/2023 0756   AST 14 04/19/2023 0756   ALT 15 04/19/2023 0756   ALKPHOS 54 04/19/2023 0756   BILITOT 0.3 04/19/2023 0756       Component Value Date/Time   TSH 1.380 04/19/2023 0756   Nutritional Lab Results  Component Value Date   VD25OH 31.8 04/19/2023   VD25OH 27.9 (L) 10/04/2022   VD25OH 38.9 07/07/2022     ASSESSMENT AND PLAN  TREATMENT PLAN FOR OBESITY:  Recommended Dietary Goals  Haley Salazar is currently in the action stage of change. As such, her goal is to continue weight management plan. She has agreed to keeping a food journal and adhering to recommended goals of 1700 calories and 100 protein.  Behavioral Intervention  We discussed the following Behavioral Modification Strategies today: increasing lean protein intake to established goals, decreasing simple carbohydrates , increasing vegetables, increasing fiber rich foods, avoiding skipping meals, increasing water intake , continue to work on maintaining a reduced calorie state, getting the recommended amount of protein, incorporating whole foods, making healthy choices, staying well hydrated and practicing mindfulness when eating., and increase protein intake, fibrous foods (25 grams per day for women, 30 grams for men) and water to improve satiety and decrease hunger signals. .  Additional resources provided today: NA  Recommended Physical Activity Goals  Haley Salazar has been advised to work up to 150 minutes of moderate intensity aerobic activity a week and strengthening exercises 2-3 times per week for cardiovascular health, weight loss maintenance and preservation of muscle mass.   She has agreed to Think about enjoyable ways to increase daily physical activity and overcoming barriers to exercise, Increase physical activity in their day and reduce sedentary time (increase NEAT)., Start strengthening exercises with a goal of 2-3 sessions a week , Increase volume of physical activity to a goal of 240 minutes a week, and Combine aerobic and strengthening exercises for efficiency and improved cardiometabolic health.   Pharmacotherapy We discussed  various medication options to help Haley Salazar with her weight loss efforts and we both agreed to decrease Zepbound  2.5mg .  side effects discussed.  ASSOCIATED CONDITIONS ADDRESSED TODAY  Action/Plan  Polyphagia -     Zepbound ; Inject 2.5 mg into the skin once a week.  Dispense: 2 mL; Refill: 0  Generalized obesity -     Zepbound ; Inject 2.5 mg into the skin once a week.  Dispense: 2 mL; Refill: 0  BMI 27.0-27.9,adult      Will obtain labs at next visit.     Return in about 4 weeks (around 10/12/2023).Haley Salazar She was informed of the importance of frequent follow up visits to maximize her success with intensive lifestyle modifications for her multiple health conditions.   ATTESTASTION STATEMENTS:  Reviewed by clinician on day of visit: allergies, medications, problem list, medical history,  surgical history, family history, social history, and previous encounter notes.     Haley Salazar. Haley Lotter FNP-C

## 2023-10-12 ENCOUNTER — Encounter: Payer: Self-pay | Admitting: Nurse Practitioner

## 2023-10-12 ENCOUNTER — Ambulatory Visit (INDEPENDENT_AMBULATORY_CARE_PROVIDER_SITE_OTHER): Admitting: Nurse Practitioner

## 2023-10-12 VITALS — BP 119/72 | HR 64 | Temp 97.9°F | Ht 64.0 in | Wt 158.0 lb

## 2023-10-12 DIAGNOSIS — E7849 Other hyperlipidemia: Secondary | ICD-10-CM

## 2023-10-12 DIAGNOSIS — E669 Obesity, unspecified: Secondary | ICD-10-CM

## 2023-10-12 DIAGNOSIS — E559 Vitamin D deficiency, unspecified: Secondary | ICD-10-CM | POA: Diagnosis not present

## 2023-10-12 DIAGNOSIS — E88819 Insulin resistance, unspecified: Secondary | ICD-10-CM

## 2023-10-12 DIAGNOSIS — R5383 Other fatigue: Secondary | ICD-10-CM

## 2023-10-12 DIAGNOSIS — Z862 Personal history of diseases of the blood and blood-forming organs and certain disorders involving the immune mechanism: Secondary | ICD-10-CM

## 2023-10-12 DIAGNOSIS — R632 Polyphagia: Secondary | ICD-10-CM

## 2023-10-12 DIAGNOSIS — E538 Deficiency of other specified B group vitamins: Secondary | ICD-10-CM | POA: Diagnosis not present

## 2023-10-12 DIAGNOSIS — Z79899 Other long term (current) drug therapy: Secondary | ICD-10-CM

## 2023-10-12 DIAGNOSIS — Z6827 Body mass index (BMI) 27.0-27.9, adult: Secondary | ICD-10-CM

## 2023-10-12 MED ORDER — ZEPBOUND 2.5 MG/0.5ML ~~LOC~~ SOAJ: 2.5000 mg | SUBCUTANEOUS | 0 refills | Status: DC

## 2023-10-12 NOTE — Progress Notes (Signed)
 Office: [phone]  /  Fax: [phone]  WEIGHT SUMMARY AND BIOMETRICS  Weight Lost Since Last Visit: 1lb  Weight Gained Since Last Visit: 0lb   Vitals Temp: 97.9 F (36.6 C) BP: 119/72 Pulse Rate: 64 SpO2: 97 %   Anthropometric Measurements Height: 5' 4 (1.626 m) Weight: 158 lb (71.7 kg) BMI (Calculated): 27.11 Weight at Last Visit: 159lb Weight Lost Since Last Visit: 1lb Weight Gained Since Last Visit: 0lb Starting Weight: 179lb Total Weight Loss (lbs): 21 lb (9.526 kg)   Body Composition  Body Fat %: 31.7 % Fat Mass (lbs): 50 lbs Muscle Mass (lbs): 102.6 lbs Total Body Water (lbs): 73 lbs Visceral Fat Rating : 4   Other Clinical Data Fasting: Yes Labs: Yes Today's Visit #: 23 Starting Date: 02/23/22     HPI  Chief Complaint: OBESITY  Azura is here to discuss her progress with her obesity treatment plan. She is on the keeping a food journal and adhering to recommended goals of 1700 calories and 80 protein and states she is following her eating plan approximately 75 % of the time. She states she is exercising 45 minutes 3 days per week.   Interval History:  Since last office visit she has lost 1 pound. She is drinking and ginger ale.  She is running 3 days per week for 2.5 miles.  She recently purchased weights.    Pharmacotherapy for weight loss: She is currently taking Zepbound  2.5 mg every 10 days for medical weight loss.  Reports some side effects of nausea and fatigue for 4 days after injections.     Zepbound  has been approved through 04/23/24.    Previous pharmacotherapy for medical weight loss:  Wegovy   History of Vit B12 def She is taking Vit B12 OTC.  Reports fatigue.   Vit D deficiency  She is taking Vit D OTC daily.  Denies side effects.  Denies nausea, vomiting or muscle weakness.  Reports fatigue.    Lab Results  Component Value Date   VD25OH 31.8 04/19/2023   VD25OH 27.9 (L) 10/04/2022   VD25OH 38.9 07/07/2022      Insulin  Resistance Last fasting insulin  was 16.5. A1c was 5.2. Polyphagia:No Medication(s): Zepbound  2.5 mg SQ weekly Lab Results  Component Value Date   HGBA1C 5.2 07/07/2022   HGBA1C 5.3 02/23/2022   HGBA1C 5.5 03/22/2021   HGBA1C 5.7 06/22/2020   Lab Results  Component Value Date   INSULIN  16.5 04/19/2023   INSULIN  16.3 07/07/2022   INSULIN  13.7 02/23/2022   Hyperlipidemia Medication(s): None. Denies side effects.    Lab Results  Component Value Date   CHOL 194 04/19/2023   HDL 62 04/19/2023   LDLCALC 113 (H) 04/19/2023   LDLDIRECT 122.0 06/22/2020   TRIG 106 04/19/2023   CHOLHDL 4.4 03/22/2021   Lab Results  Component Value Date   ALT 15 04/19/2023   AST 14 04/19/2023   GGT 9 03/22/2021   ALKPHOS 54 04/19/2023   BILITOT 0.3 04/19/2023   The ASCVD Risk score (Arnett DK, et al., 2019) failed to calculate for the following reasons:   The 2019 ASCVD risk score is only valid for ages 35 to 66   PHYSICAL EXAM:  Blood pressure 119/72, pulse 64, temperature 97.9 F (36.6 C), height 5' 4 (1.626 m), weight 158 lb (71.7 kg), SpO2 97%. Body mass index is 27.12 kg/m.  General: She is overweight, cooperative, alert, well developed, and in no acute distress. PSYCH: Has normal mood, affect and thought process.  Extremities: No edema.  Neurologic: No gross sensory or motor deficits. No tremors or fasciculations noted.    DIAGNOSTIC DATA REVIEWED:  BMET    Component Value Date/Time   NA 140 04/19/2023 0756   K 4.6 04/19/2023 0756   CL 104 04/19/2023 0756   CO2 22 04/19/2023 0756   GLUCOSE 81 04/19/2023 0756   GLUCOSE 96 06/22/2020 1357   BUN 6 04/19/2023 0756   CREATININE 0.69 04/19/2023 0756   CREATININE 0.67 10/29/2019 0910   CALCIUM 9.8 04/19/2023 0756   GFRNONAA 124 10/29/2019 0910   GFRAA 144 10/29/2019 0910   Lab Results  Component Value Date   HGBA1C 5.2 07/07/2022   HGBA1C 5.7 06/22/2020   Lab Results  Component Value Date   INSULIN  16.5  04/19/2023   INSULIN  13.7 02/23/2022   Lab Results  Component Value Date   TSH 1.380 04/19/2023   CBC    Component Value Date/Time   WBC 6.9 04/19/2023 0756   WBC 9.4 06/22/2020 1357   RBC 4.52 04/19/2023 0756   RBC 4.41 06/22/2020 1357   HGB 13.8 04/19/2023 0756   HCT 42.0 04/19/2023 0756   PLT 325 04/19/2023 0756   MCV 93 04/19/2023 0756   MCH 30.5 04/19/2023 0756   MCH 29.2 10/29/2019 0910   MCHC 32.9 04/19/2023 0756   MCHC 33.7 06/22/2020 1357   RDW 11.9 04/19/2023 0756   Iron Studies    Component Value Date/Time   IRON 105 02/23/2022 0952   TIBC 422 02/23/2022 0952   FERRITIN 87 02/23/2022 0952   IRONPCTSAT 25 02/23/2022 0952   Lipid Panel     Component Value Date/Time   CHOL 194 04/19/2023 0756   TRIG 106 04/19/2023 0756   HDL 62 04/19/2023 0756   CHOLHDL 4.4 03/22/2021 0930   CHOLHDL 4 06/22/2020 1357   VLDL 40.2 (H) 06/22/2020 1357   LDLCALC 113 (H) 04/19/2023 0756   LDLCALC 141 (H) 10/29/2019 0910   LDLDIRECT 122.0 06/22/2020 1357   Hepatic Function Panel     Component Value Date/Time   PROT 7.7 04/19/2023 0756   ALBUMIN 4.8 04/19/2023 0756   AST 14 04/19/2023 0756   ALT 15 04/19/2023 0756   ALKPHOS 54 04/19/2023 0756   BILITOT 0.3 04/19/2023 0756      Component Value Date/Time   TSH 1.380 04/19/2023 0756   Nutritional Lab Results  Component Value Date   VD25OH 31.8 04/19/2023   VD25OH 27.9 (L) 10/04/2022   VD25OH 38.9 07/07/2022     ASSESSMENT AND PLAN  TREATMENT PLAN FOR OBESITY:  Recommended Dietary Goals  Lannah is currently in the action stage of change. As such, her goal is to continue weight management plan. She has agreed to keeping a food journal and adhering to recommended goals of 1700 calories and 80+ protein.  Behavioral Intervention  We discussed the following Behavioral Modification Strategies today: increasing lean protein intake to established goals, decreasing simple carbohydrates , increasing vegetables,  increasing fiber rich foods, increasing water intake , reading food labels , keeping healthy foods at home, continue to work on maintaining a reduced calorie state, getting the recommended amount of protein, incorporating whole foods, making healthy choices, staying well hydrated and practicing mindfulness when eating., and increase protein intake, fibrous foods (25 grams per day for women, 30 grams for men) and water to improve satiety and decrease hunger signals. .  Additional resources provided today: NA  Recommended Physical Activity Goals  Ahrianna has been advised to work up  to 150 minutes of moderate intensity aerobic activity a week and strengthening exercises 2-3 times per week for cardiovascular health, weight loss maintenance and preservation of muscle mass.   She has agreed to Think about enjoyable ways to increase daily physical activity and overcoming barriers to exercise, Increase physical activity in their day and reduce sedentary time (increase NEAT)., Start strengthening exercises with a goal of 2-3 sessions a week , Continue to gradually increase the amount and intensity of exercise routine, and Combine aerobic and strengthening exercises for efficiency and improved cardiometabolic health.   Pharmacotherapy We discussed various medication options to help [name] with her weight loss efforts and we both agreed to continue Zepbound  2.5mg . Side effects discussed.  ASSOCIATED CONDITIONS ADDRESSED TODAY  Action/Plan  B12 deficiency -     Vitamin B12  Vitamin D  deficiency -     VITAMIN D  25 Hydroxy (Vit-D Deficiency, Fractures)  Insulin  resistance -     Hemoglobin A1c -     Insulin , random  Other hyperlipidemia -     Lipid Panel With LDL/HDL Ratio  History of anemia -     CBC with Differential/Platelet -     Vitamin B12 -     Ferritin -     Iron  Polyphagia -     Zepbound ; Inject 2.5 mg into the skin once a week.  Dispense: 2 mL; Refill: 0  Other fatigue -      CBC with Differential/Platelet -     Comprehensive metabolic panel with GFR -     Vitamin B12 -     TSH -     Ferritin -     Iron  Medication management -     Comprehensive metabolic panel with GFR -     TSH  Generalized obesity -     Zepbound ; Inject 2.5 mg into the skin once a week.  Dispense: 2 mL; Refill: 0  BMI 27.0-27.9,adult         Return in about 4 weeks (around 11/09/2023).[name] She was informed of the importance of frequent follow up visits to maximize her success with intensive lifestyle modifications for her multiple health conditions.   ATTESTASTION STATEMENTS:  Reviewed by clinician on day of visit: allergies, medications, problem list, medical history, surgical history, family history, social history, and previous encounter notes.     Corean SAUNDERS. Wendelin Reader FNP-C

## 2023-10-14 LAB — CBC WITH DIFFERENTIAL/PLATELET
Basophils Absolute: 0 x10E3/uL (ref 0.0–0.2)
Basos: 1 %
EOS (ABSOLUTE): 0.1 x10E3/uL (ref 0.0–0.4)
Eos: 1 %
Hematocrit: 41.3 % (ref 34.0–46.6)
Hemoglobin: 13.2 g/dL (ref 11.1–15.9)
Immature Grans (Abs): 0 x10E3/uL (ref 0.0–0.1)
Immature Granulocytes: 0 %
Lymphocytes Absolute: 2.3 x10E3/uL (ref 0.7–3.1)
Lymphs: 36 %
MCH: 30.3 pg (ref 26.6–33.0)
MCHC: 32 g/dL (ref 31.5–35.7)
MCV: 95 fL (ref 79–97)
Monocytes Absolute: 0.4 x10E3/uL (ref 0.1–0.9)
Monocytes: 6 %
Neutrophils Absolute: 3.7 x10E3/uL (ref 1.4–7.0)
Neutrophils: 56 %
Platelets: 326 x10E3/uL (ref 150–450)
RBC: 4.36 x10E6/uL (ref 3.77–5.28)
RDW: 11.9 % (ref 11.7–15.4)
WBC: 6.5 x10E3/uL (ref 3.4–10.8)

## 2023-10-14 LAB — TSH: TSH: 1.21 u[IU]/mL (ref 0.450–4.500)

## 2023-10-14 LAB — VITAMIN D 25 HYDROXY (VIT D DEFICIENCY, FRACTURES): Vit D, 25-Hydroxy: 35.8 ng/mL (ref 30.0–100.0)

## 2023-10-14 LAB — COMPREHENSIVE METABOLIC PANEL WITH GFR
ALT: 12 IU/L (ref 0–32)
AST: 10 IU/L (ref 0–40)
Albumin: 4.6 g/dL (ref 4.0–5.0)
Alkaline Phosphatase: 56 IU/L (ref 41–116)
BUN/Creatinine Ratio: 11 (ref 9–23)
BUN: 8 mg/dL (ref 6–20)
Bilirubin Total: 0.3 mg/dL (ref 0.0–1.2)
CO2: 20 mmol/L (ref 20–29)
Calcium: 9.8 mg/dL (ref 8.7–10.2)
Chloride: 105 mmol/L (ref 96–106)
Creatinine, Ser: 0.75 mg/dL (ref 0.57–1.00)
Globulin, Total: 3.2 g/dL (ref 1.5–4.5)
Glucose: 77 mg/dL (ref 70–99)
Potassium: 4.8 mmol/L (ref 3.5–5.2)
Sodium: 141 mmol/L (ref 134–144)
Total Protein: 7.8 g/dL (ref 6.0–8.5)
eGFR: 112 mL/min/1.73 (ref >59)

## 2023-10-14 LAB — HEMOGLOBIN A1C
Est. average glucose Bld gHb Est-mCnc: 97 mg/dL
Hgb A1c MFr Bld: 5 % (ref 4.8–5.6)

## 2023-10-14 LAB — LIPID PANEL WITH LDL/HDL RATIO
Cholesterol, Total: 198 mg/dL (ref 100–199)
HDL: 58 mg/dL (ref >39)
LDL Chol Calc (NIH): 119 mg/dL — ABNORMAL HIGH (ref 0–99)
LDL/HDL Ratio: 2.1 ratio (ref 0.0–3.2)
Triglycerides: 121 mg/dL (ref 0–149)
VLDL Cholesterol Cal: 21 mg/dL (ref 5–40)

## 2023-10-14 LAB — INSULIN, RANDOM: INSULIN: 25.3 u[IU]/mL — ABNORMAL HIGH (ref 2.6–24.9)

## 2023-10-14 LAB — IRON: Iron: 113 ug/dL (ref 27–159)

## 2023-10-14 LAB — FERRITIN: Ferritin: 84 ng/mL (ref 15–150)

## 2023-10-14 LAB — VITAMIN B12: Vitamin B-12: 717 pg/mL (ref 232–1245)

## 2023-11-16 ENCOUNTER — Ambulatory Visit: Admitting: Nurse Practitioner

## 2023-11-16 ENCOUNTER — Encounter: Payer: Self-pay | Admitting: Nurse Practitioner

## 2023-11-16 VITALS — BP 120/81 | HR 77 | Temp 98.1°F | Ht 64.0 in | Wt 156.0 lb

## 2023-11-16 DIAGNOSIS — R112 Nausea with vomiting, unspecified: Secondary | ICD-10-CM

## 2023-11-16 DIAGNOSIS — R632 Polyphagia: Secondary | ICD-10-CM | POA: Diagnosis not present

## 2023-11-16 DIAGNOSIS — E669 Obesity, unspecified: Secondary | ICD-10-CM | POA: Diagnosis not present

## 2023-11-16 DIAGNOSIS — Z6826 Body mass index (BMI) 26.0-26.9, adult: Secondary | ICD-10-CM | POA: Diagnosis not present

## 2023-11-16 MED ORDER — ZEPBOUND 2.5 MG/0.5ML ~~LOC~~ SOAJ: 2.5000 mg | SUBCUTANEOUS | 0 refills | Status: DC

## 2023-11-16 MED ORDER — ONDANSETRON HCL 4 MG PO TABS: 4.0000 mg | ORAL_TABLET | Freq: Three times a day (TID) | ORAL | 0 refills | Status: DC | PRN

## 2023-11-16 NOTE — Progress Notes (Signed)
 Office: [phone]  /  Fax: [phone]  WEIGHT SUMMARY AND BIOMETRICS  Weight Lost Since Last Visit: 2lb  Weight Gained Since Last Visit: 0lb   Vitals Temp: 98.1 F (36.7 C) BP: 120/81 Pulse Rate: 77 SpO2: 97 %   Anthropometric Measurements Height: 5' 4 (1.626 m) Weight: 156 lb (70.8 kg) BMI (Calculated): 26.76 Weight at Last Visit: 158lb Weight Lost Since Last Visit: 2lb Weight Gained Since Last Visit: 0lb Starting Weight: 179lb Total Weight Loss (lbs): 23 lb (10.4 kg)   Body Composition  Body Fat %: 31.6 % Fat Mass (lbs): 49.6 lbs Muscle Mass (lbs): 101.8 lbs Total Body Water (lbs): 71.8 lbs Visceral Fat Rating : 4   Other Clinical Data Fasting: Yes Labs: No Today's Visit #: 24 Starting Date: 02/23/22     HPI  Chief Complaint: OBESITY  Elisea is here to discuss her progress with her obesity treatment plan. She is on the keeping a food journal and adhering to recommended goals of 1700 calories and 80 protein and states she is following her eating plan approximately 70 % of the time. She states she is exercising 60 minutes 4 days per week.   Interval History:  Since last office visit she has lost 2 pounds.  She is surprised she lost weight.  She has been struggling with polyphagia. She doesn't feel she is meeting her protein goals.   She is running 3 days per week 45 minutes/walks one mile and going to boot camp one day per week.  Her goal is to go to boot camp 2 days per week.    Pharmacotherapy for weight loss: She is currently taking Zepbound  2.5 mg weekly for medical weight loss.  Reports some side effects of nausea for 4 days after injections.  Denies vomiting.  Takes Zofran  PRN.     Zepbound  has been approved through 04/23/24.    Previous pharmacotherapy for medical weight loss:  Wegovy    PHYSICAL EXAM:  Blood pressure 120/81, pulse 77, temperature 98.1 F (36.7 C), height 5' 4 (1.626 m), weight 156 lb (70.8 kg), SpO2 97%. Body mass  index is 26.78 kg/m.  General: She is overweight, cooperative, alert, well developed, and in no acute distress. PSYCH: Has normal mood, affect and thought process.   Extremities: No edema.  Neurologic: No gross sensory or motor deficits. No tremors or fasciculations noted.    DIAGNOSTIC DATA REVIEWED:  BMET    Component Value Date/Time   NA 141 10/12/2023 0847   K 4.8 10/12/2023 0847   CL 105 10/12/2023 0847   CO2 20 10/12/2023 0847   GLUCOSE 77 10/12/2023 0847   GLUCOSE 96 06/22/2020 1357   BUN 8 10/12/2023 0847   CREATININE 0.75 10/12/2023 0847   CREATININE 0.67 10/29/2019 0910   CALCIUM 9.8 10/12/2023 0847   GFRNONAA 124 10/29/2019 0910   GFRAA 144 10/29/2019 0910   Lab Results  Component Value Date   HGBA1C 5.0 10/12/2023   HGBA1C 5.7 06/22/2020   Lab Results  Component Value Date   INSULIN  25.3 (H) 10/12/2023   INSULIN  13.7 02/23/2022   Lab Results  Component Value Date   TSH 1.210 10/12/2023   CBC    Component Value Date/Time   WBC 6.5 10/12/2023 0847   WBC 9.4 06/22/2020 1357   RBC 4.36 10/12/2023 0847   RBC 4.41 06/22/2020 1357   HGB 13.2 10/12/2023 0847   HCT 41.3 10/12/2023 0847   PLT 326 10/12/2023 0847   MCV 95 10/12/2023 0847  MCH 30.3 10/12/2023 0847   MCH 29.2 10/29/2019 0910   MCHC 32.0 10/12/2023 0847   MCHC 33.7 06/22/2020 1357   RDW 11.9 10/12/2023 0847   Iron Studies    Component Value Date/Time   IRON 113 10/12/2023 0847   TIBC 422 02/23/2022 0952   FERRITIN 84 10/12/2023 0847   IRONPCTSAT 25 02/23/2022 0952   Lipid Panel     Component Value Date/Time   CHOL 198 10/12/2023 0847   TRIG 121 10/12/2023 0847   HDL 58 10/12/2023 0847   CHOLHDL 4.4 03/22/2021 0930   CHOLHDL 4 06/22/2020 1357   VLDL 40.2 (H) 06/22/2020 1357   LDLCALC 119 (H) 10/12/2023 0847   LDLCALC 141 (H) 10/29/2019 0910   LDLDIRECT 122.0 06/22/2020 1357   Hepatic Function Panel     Component Value Date/Time   PROT 7.8 10/12/2023 0847   ALBUMIN 4.6  10/12/2023 0847   AST 10 10/12/2023 0847   ALT 12 10/12/2023 0847   ALKPHOS 56 10/12/2023 0847   BILITOT 0.3 10/12/2023 0847      Component Value Date/Time   TSH 1.210 10/12/2023 0847   Nutritional Lab Results  Component Value Date   VD25OH 35.8 10/12/2023   VD25OH 31.8 04/19/2023   VD25OH 27.9 (L) 10/04/2022     ASSESSMENT AND PLAN  TREATMENT PLAN FOR OBESITY:  Recommended Dietary Goals  Hatley is currently in the action stage of change. As such, her goal is to continue weight management plan. She has agreed to keeping a food journal and adhering to recommended goals of 1800 calories and 90+ grams of protein.  I've asked her to increase her calories and work on meeting her protein goals.    Behavioral Intervention  We discussed the following Behavioral Modification Strategies today: increasing lean protein intake to established goals, decreasing simple carbohydrates , increasing vegetables, increasing fiber rich foods, increasing water intake , reading food labels , keeping healthy foods at home, planning for success, continue to work on maintaining a reduced calorie state, getting the recommended amount of protein, incorporating whole foods, making healthy choices, staying well hydrated and practicing mindfulness when eating., and increase protein intake, fibrous foods (25 grams per day for women, 30 grams for men) and water to improve satiety and decrease hunger signals. .  Additional resources provided today: NA  Recommended Physical Activity Goals  Nashay has been advised to work up to 150 minutes of moderate intensity aerobic activity a week and strengthening exercises 2-3 times per week for cardiovascular health, weight loss maintenance and preservation of muscle mass.   She has agreed to Think about enjoyable ways to increase daily physical activity and overcoming barriers to exercise, Increase physical activity in their day and reduce sedentary time (increase  NEAT)., Start strengthening exercises with a goal of 2-3 sessions a week , Increase volume of physical activity to a goal of 240 minutes a week, and Combine aerobic and strengthening exercises for efficiency and improved cardiometabolic health.   Pharmacotherapy We discussed various medication options to help Taneka with her weight loss efforts and we both agreed to continue Zepbound  2.5mg .  Side effects discused.  I'm not going to increase the dose since she is still having some side effects of nausea.  She has also overall done well with weight loss. We also discussed stopping Zepbound . She doesn't want to stop at this time.  She is worried about upcoming holidays.     ASSOCIATED CONDITIONS ADDRESSED TODAY  Action/Plan  Polyphagia -  Zepbound ; Inject 2.5 mg into the skin once a week.  Dispense: 2 mL; Refill: 0  Nausea and vomiting, unspecified vomiting type -     Ondansetron  HCl; Take 1 tablet (4 mg total) by mouth every 8 (eight) hours as needed for nausea or vomiting.  Dispense: 20 tablet; Refill: 0  She can also try ginger, peppermint, lemon, etc to see is will help with nausea.    Generalized obesity -     Zepbound ; Inject 2.5 mg into the skin once a week.  Dispense: 2 mL; Refill: 0  BMI 26.0-26.9,adult     Labs reviewed in chart with patient from 10/12/23    Return in about 4 weeks (around 12/14/2023).[name] She was informed of the importance of frequent follow up visits to maximize her success with intensive lifestyle modifications for her multiple health conditions.   ATTESTASTION STATEMENTS:  Reviewed by clinician on day of visit: allergies, medications, problem list, medical history, surgical history, family history, social history, and previous encounter notes.     Corean SAUNDERS. Treyvin Glidden FNP-C

## 2023-12-25 ENCOUNTER — Encounter: Payer: Self-pay | Admitting: Nurse Practitioner

## 2023-12-25 ENCOUNTER — Ambulatory Visit: Admitting: Nurse Practitioner

## 2023-12-25 VITALS — BP 115/81 | HR 60 | Temp 97.9°F | Ht 64.0 in | Wt 157.0 lb

## 2023-12-25 DIAGNOSIS — E669 Obesity, unspecified: Secondary | ICD-10-CM

## 2023-12-25 DIAGNOSIS — R112 Nausea with vomiting, unspecified: Secondary | ICD-10-CM

## 2023-12-25 DIAGNOSIS — Z6826 Body mass index (BMI) 26.0-26.9, adult: Secondary | ICD-10-CM

## 2023-12-25 DIAGNOSIS — R632 Polyphagia: Secondary | ICD-10-CM

## 2023-12-25 MED ORDER — ONDANSETRON HCL 4 MG PO TABS: 4.0000 mg | ORAL_TABLET | Freq: Three times a day (TID) | ORAL | 0 refills | Status: AC | PRN

## 2023-12-25 MED ORDER — ZEPBOUND 2.5 MG/0.5ML ~~LOC~~ SOAJ: 2.5000 mg | SUBCUTANEOUS | 0 refills | Status: DC

## 2023-12-25 NOTE — Progress Notes (Signed)
 Office: [phone]  /  Fax: [phone]  WEIGHT SUMMARY AND BIOMETRICS  Weight Lost Since Last Visit: 0lb  Weight Gained Since Last Visit: 1lb   Vitals Temp: 97.9 F (36.6 C) BP: 115/81 Pulse Rate: 60 SpO2: 97 %   Anthropometric Measurements Height: 5' 4 (1.626 m) Weight: 157 lb (71.2 kg) BMI (Calculated): 26.94 Weight at Last Visit: 156lb Weight Lost Since Last Visit: 0lb Weight Gained Since Last Visit: 1lb Starting Weight: 179lb Total Weight Loss (lbs): 22 lb (9.979 kg)   Body Composition  Body Fat %: 31.9 % Fat Mass (lbs): 50.2 lbs Muscle Mass (lbs): 101.8 lbs Total Body Water (lbs): 72.2 lbs Visceral Fat Rating : 4   Other Clinical Data Fasting: Yes Labs: No Today's Visit #: 25 Starting Date: 02/23/22     HPI  Chief Complaint: OBESITY  Haley Salazar is here to discuss her progress with her obesity treatment plan. She is on the keeping a food journal and adhering to recommended goals of 1700 calories and 80 protein and states she is following her eating plan approximately 40 % of the time. She states she is exercising 45 minutes 2 days per week.   Interval History:  Since last office visit she has gained 1 pound. She notes got off track over the holidays and has been eating more.  She is drinking water daily.   She is running 1-4 days per week.  She is also doing 1-2 days of resistance training.   Pharmacotherapy for weight loss: She is currently taking Zepbound  2.5 mg weekly for medical weight loss.  Notes occ nausea and takes Zofran  PRN.     Zepbound  has been approved through 04/23/24.    Previous pharmacotherapy for medical weight loss:  Wegovy     PHYSICAL EXAM:  Blood pressure 115/81, pulse 60, temperature 97.9 F (36.6 C), height 5' 4 (1.626 m), weight 157 lb (71.2 kg), SpO2 97%. Body mass index is 26.95 kg/m.  General: She is overweight, cooperative, alert, well developed, and in no acute distress. PSYCH: Has normal mood, affect and  thought process.   Extremities: No edema.  Neurologic: No gross sensory or motor deficits. No tremors or fasciculations noted.    DIAGNOSTIC DATA REVIEWED:  BMET    Component Value Date/Time   NA 141 10/12/2023 0847   K 4.8 10/12/2023 0847   CL 105 10/12/2023 0847   CO2 20 10/12/2023 0847   GLUCOSE 77 10/12/2023 0847   GLUCOSE 96 06/22/2020 1357   BUN 8 10/12/2023 0847   CREATININE 0.75 10/12/2023 0847   CREATININE 0.67 10/29/2019 0910   CALCIUM 9.8 10/12/2023 0847   GFRNONAA 124 10/29/2019 0910   GFRAA 144 10/29/2019 0910   Lab Results  Component Value Date   HGBA1C 5.0 10/12/2023   HGBA1C 5.7 06/22/2020   Lab Results  Component Value Date   INSULIN  25.3 (H) 10/12/2023   INSULIN  13.7 02/23/2022   Lab Results  Component Value Date   TSH 1.210 10/12/2023   CBC    Component Value Date/Time   WBC 6.5 10/12/2023 0847   WBC 9.4 06/22/2020 1357   RBC 4.36 10/12/2023 0847   RBC 4.41 06/22/2020 1357   HGB 13.2 10/12/2023 0847   HCT 41.3 10/12/2023 0847   PLT 326 10/12/2023 0847   MCV 95 10/12/2023 0847   MCH 30.3 10/12/2023 0847   MCH 29.2 10/29/2019 0910   MCHC 32.0 10/12/2023 0847   MCHC 33.7 06/22/2020 1357   RDW 11.9 10/12/2023 0847  Iron Studies    Component Value Date/Time   IRON 113 10/12/2023 0847   TIBC 422 02/23/2022 0952   FERRITIN 84 10/12/2023 0847   IRONPCTSAT 25 02/23/2022 0952   Lipid Panel     Component Value Date/Time   CHOL 198 10/12/2023 0847   TRIG 121 10/12/2023 0847   HDL 58 10/12/2023 0847   CHOLHDL 4.4 03/22/2021 0930   CHOLHDL 4 06/22/2020 1357   VLDL 40.2 (H) 06/22/2020 1357   LDLCALC 119 (H) 10/12/2023 0847   LDLCALC 141 (H) 10/29/2019 0910   LDLDIRECT 122.0 06/22/2020 1357   Hepatic Function Panel     Component Value Date/Time   PROT 7.8 10/12/2023 0847   ALBUMIN 4.6 10/12/2023 0847   AST 10 10/12/2023 0847   ALT 12 10/12/2023 0847   ALKPHOS 56 10/12/2023 0847   BILITOT 0.3 10/12/2023 0847      Component Value  Date/Time   TSH 1.210 10/12/2023 0847   Nutritional Lab Results  Component Value Date   VD25OH 35.8 10/12/2023   VD25OH 31.8 04/19/2023   VD25OH 27.9 (L) 10/04/2022     ASSESSMENT AND PLAN  TREATMENT PLAN FOR OBESITY:  Recommended Dietary Goals  Haley Salazar is currently in the action stage of change. As such, her goal is to continue weight management plan. She has agreed to practicing portion control and making smarter food choices, such as increasing vegetables and decreasing simple carbohydrates.  Behavioral Intervention  We discussed the following Behavioral Modification Strategies today: increasing lean protein intake to established goals, decreasing simple carbohydrates , increasing vegetables, increasing fiber rich foods, increasing water intake , work on meal planning and preparation, reading food labels , planning for success, celebration eating strategies, continue to work on maintaining a reduced calorie state, getting the recommended amount of protein, incorporating whole foods, making healthy choices, staying well hydrated and practicing mindfulness when eating., and increase protein intake, fibrous foods (25 grams per day for women, 30 grams for men) and water to improve satiety and decrease hunger signals. .  Additional resources provided today: NA  Recommended Physical Activity Goals  Haley Salazar has been advised to work up to 150 minutes of moderate intensity aerobic activity a week and strengthening exercises 2-3 times per week for cardiovascular health, weight loss maintenance and preservation of muscle mass.   She has agreed to Think about enjoyable ways to increase daily physical activity and overcoming barriers to exercise, Increase physical activity in their day and reduce sedentary time (increase NEAT)., Start strengthening exercises with a goal of 2-3 sessions a week , Continue to gradually increase the amount and intensity of exercise routine, Increase volume of  physical activity to a goal of 240 minutes a week, and Combine aerobic and strengthening exercises for efficiency and improved cardiometabolic health.   Pharmacotherapy We discussed various medication options to help Haley Salazar with her weight loss efforts and we both agreed to continue Zepbound  2.5mg .  Side effects discussed.  ASSOCIATED CONDITIONS ADDRESSED TODAY  Action/Plan  Polyphagia -     Zepbound ; Inject 2.5 mg into the skin once a week.  Dispense: 2 mL; Refill: 0  Nausea and vomiting, unspecified vomiting type -     Ondansetron  HCl; Take 1 tablet (4 mg total) by mouth every 8 (eight) hours as needed for nausea or vomiting.  Dispense: 20 tablet; Refill: 0  Generalized obesity -     Zepbound ; Inject 2.5 mg into the skin once a week.  Dispense: 2 mL; Refill: 0  BMI 26.0-26.9,adult  Return in about 4 weeks (around 01/22/2024).Haley Salazar She was informed of the importance of frequent follow up visits to maximize her success with intensive lifestyle modifications for her multiple health conditions.   ATTESTASTION STATEMENTS:  Reviewed by clinician on day of visit: allergies, medications, problem list, medical history, surgical history, family history, social history, and previous encounter notes.     Haley Salazar. Jora Galluzzo FNP-C

## 2024-01-31 ENCOUNTER — Ambulatory Visit: Admitting: Nurse Practitioner

## 2024-02-15 ENCOUNTER — Encounter: Payer: Self-pay | Admitting: Nurse Practitioner

## 2024-02-15 ENCOUNTER — Ambulatory Visit: Admitting: Nurse Practitioner

## 2024-02-15 DIAGNOSIS — Z6827 Body mass index (BMI) 27.0-27.9, adult: Secondary | ICD-10-CM

## 2024-02-15 DIAGNOSIS — E669 Obesity, unspecified: Secondary | ICD-10-CM

## 2024-02-15 DIAGNOSIS — R632 Polyphagia: Secondary | ICD-10-CM | POA: Diagnosis not present

## 2024-02-15 MED ORDER — ZEPBOUND 2.5 MG/0.5ML ~~LOC~~ SOAJ: 2.5000 mg | SUBCUTANEOUS | 0 refills | Status: AC

## 2024-02-15 NOTE — Progress Notes (Signed)
 "  Office: [phone]  /  Fax: [phone]  WEIGHT SUMMARY AND BIOMETRICS  Weight Lost Since Last Visit: 0lb  Weight Gained Since Last Visit: 2lb   Vitals Temp: 98.2 F (36.8 C) BP: 129/86 Pulse Rate: 76 SpO2: 96 %   Anthropometric Measurements Height: 5' 4 (1.626 m) Weight: 159 lb (72.1 kg) BMI (Calculated): 27.28 Weight at Last Visit: 157lb Weight Lost Since Last Visit: 0lb Weight Gained Since Last Visit: 2lb Starting Weight: 179lb Total Weight Loss (lbs): 20 lb (9.072 kg)   Body Composition  Body Fat %: 32.3 % Fat Mass (lbs): 51.4 lbs Muscle Mass (lbs): 102.4 lbs Total Body Water (lbs): 72.4 lbs Visceral Fat Rating : 4   Other Clinical Data Fasting: No Labs: No Today's Visit #: 26 Starting Date: 02/23/22     HPI  Chief Complaint: OBESITY  Haley Salazar is here to discuss her progress with her obesity treatment plan. She is on the 33 and states she is following her eating plan approximately 50 % of the time. She states she is exercising 45 minutes 2 days per week.   Interval History:  Since last office visit she has gained 2 pounds.  She has been under a lot of stress since her last visit. She was surprised she didn't gain more weight. She is drinking water and hot tea.  She is running 3-4 days per week and 1 day cardio/resistance training.  She hasn't been able to run as much over the past week due to the weather.    Pharmacotherapy for weight loss: She is currently taking Zepbound  2.5 mg weekly for medical weight loss.  Notes occ nausea and takes Zofran  PRN.  She missed a couple doses and noted an increase in polyphagia and cravings.    Zepbound  has been approved through 04/23/24.    Previous pharmacotherapy for medical weight loss:  [facility]   PHYSICAL EXAM:  Blood pressure 129/86, pulse 76, temperature 98.2 F (36.8 C), height 5' 4 (1.626 m), weight 159 lb (72.1 kg), SpO2 96%. Body mass index is 27.29 kg/m.  General: She is overweight,  cooperative, alert, well developed, and in no acute distress. PSYCH: Has normal mood, affect and thought process.   Extremities: No edema.  Neurologic: No gross sensory or motor deficits. No tremors or fasciculations noted.    DIAGNOSTIC DATA REVIEWED:  BMET    Component Value Date/Time   NA 141 10/12/2023 0847   K 4.8 10/12/2023 0847   CL 105 10/12/2023 0847   CO2 20 10/12/2023 0847   GLUCOSE 77 10/12/2023 0847   GLUCOSE 96 06/22/2020 1357   BUN 8 10/12/2023 0847   CREATININE 0.75 10/12/2023 0847   CREATININE 0.67 10/29/2019 0910   CALCIUM 9.8 10/12/2023 0847   GFRNONAA 124 10/29/2019 0910   GFRAA 144 10/29/2019 0910   Lab Results  Component Value Date   HGBA1C 5.0 10/12/2023   HGBA1C 5.7 06/22/2020   Lab Results  Component Value Date   INSULIN  25.3 (H) 10/12/2023   INSULIN  13.7 02/23/2022   Lab Results  Component Value Date   TSH 1.210 10/12/2023   CBC    Component Value Date/Time   WBC 6.5 10/12/2023 0847   WBC 9.4 06/22/2020 1357   RBC 4.36 10/12/2023 0847   RBC 4.41 06/22/2020 1357   HGB 13.2 10/12/2023 0847   HCT 41.3 10/12/2023 0847   PLT 326 10/12/2023 0847   MCV 95 10/12/2023 0847   MCH 30.3 10/12/2023 0847   MCH 29.2 10/29/2019 0910  MCHC 32.0 10/12/2023 0847   MCHC 33.7 06/22/2020 1357   RDW 11.9 10/12/2023 0847   Iron Studies    Component Value Date/Time   IRON 113 10/12/2023 0847   TIBC 422 02/23/2022 0952   FERRITIN 84 10/12/2023 0847   IRONPCTSAT 25 02/23/2022 0952   Lipid Panel     Component Value Date/Time   CHOL 198 10/12/2023 0847   TRIG 121 10/12/2023 0847   HDL 58 10/12/2023 0847   CHOLHDL 4.4 03/22/2021 0930   CHOLHDL 4 06/22/2020 1357   VLDL 40.2 (H) 06/22/2020 1357   LDLCALC 119 (H) 10/12/2023 0847   LDLCALC 141 (H) 10/29/2019 0910   LDLDIRECT 122.0 06/22/2020 1357   Hepatic Function Panel     Component Value Date/Time   PROT 7.8 10/12/2023 0847   ALBUMIN 4.6 10/12/2023 0847   AST 10 10/12/2023 0847   ALT 12  10/12/2023 0847   ALKPHOS 56 10/12/2023 0847   BILITOT 0.3 10/12/2023 0847      Component Value Date/Time   TSH 1.210 10/12/2023 0847   Nutritional Lab Results  Component Value Date   VD25OH 35.8 10/12/2023   VD25OH 31.8 04/19/2023   VD25OH 27.9 (L) 10/04/2022     ASSESSMENT AND PLAN  TREATMENT PLAN FOR OBESITY:  Recommended Dietary Goals  Haley Salazar has done well with weight loss.  She has agreed to practicing portion control and making smarter food choices, such as increasing vegetables and decreasing simple carbohydrates.  Behavioral Intervention  We discussed the following Behavioral Modification Strategies today: increasing lean protein intake to established goals, decreasing simple carbohydrates , increasing vegetables, increasing fiber rich foods, increasing water intake , work on meal planning and preparation, reading food labels , keeping healthy foods at home, planning for success, continue to work on maintaining a reduced calorie state, getting the recommended amount of protein, incorporating whole foods, making healthy choices, staying well hydrated and practicing mindfulness when eating., and increase protein intake, fibrous foods (25 grams per day for women, 30 grams for men) and water to improve satiety and decrease hunger signals. .  Additional resources provided today: NA  Recommended Physical Activity Goals  Haley Salazar has been advised to work up to 150 minutes of moderate intensity aerobic activity a week and strengthening exercises 2-3 times per week for cardiovascular health, weight loss maintenance and preservation of muscle mass.   She has agreed to Think about enjoyable ways to increase daily physical activity and overcoming barriers to exercise, Increase physical activity in their day and reduce sedentary time (increase NEAT)., Continue to gradually increase the amount and intensity of exercise routine, Increase volume of physical activity to a goal of 240  minutes a week, and Combine aerobic and strengthening exercises for efficiency and improved cardiometabolic health.   Pharmacotherapy We discussed various medication options to help Haley Salazar with her weight loss efforts and we both agreed to continue Zepbound  2.5mg . Side effects discussed.  ASSOCIATED CONDITIONS ADDRESSED TODAY  Action/Plan  Polyphagia -     Zepbound ; Inject 2.5 mg into the skin once a week.  Dispense: 2 mL; Refill: 0  Generalized obesity -     Zepbound ; Inject 2.5 mg into the skin once a week.  Dispense: 2 mL; Refill: 0         Return in about 4 weeks (around 03/14/2024).Haley Salazar She was informed of the importance of frequent follow up visits to maximize her success with intensive lifestyle modifications for her multiple health conditions.   ATTESTASTION STATEMENTS:  Reviewed by  clinician on day of visit: allergies, medications, problem list, medical history, surgical history, family history, social history, and previous encounter notes.   Haley Salazar. Haley Asch FNP-C "

## 2024-02-16 NOTE — Addendum Note (Signed)
 Addended by: ONEITA JOSETTE CROME on: 02/16/2024 07:34 AM   Modules accepted: Orders

## 2024-03-14 ENCOUNTER — Ambulatory Visit: Admitting: Nurse Practitioner
